# Patient Record
Sex: Male | Born: 1950 | Race: White | Hispanic: No | Smoking: Former smoker
Health system: Southern US, Community
[De-identification: ages and names within clinical notes are randomized; demographics above are authoritative.]

## PROBLEM LIST (undated history)

## (undated) DIAGNOSIS — R51 Headache: Secondary | ICD-10-CM

## (undated) DIAGNOSIS — R519 Headache, unspecified: Secondary | ICD-10-CM

## (undated) DIAGNOSIS — I10 Essential (primary) hypertension: Secondary | ICD-10-CM

## (undated) DIAGNOSIS — I82409 Acute embolism and thrombosis of unspecified deep veins of unspecified lower extremity: Secondary | ICD-10-CM

## (undated) DIAGNOSIS — E7800 Pure hypercholesterolemia, unspecified: Secondary | ICD-10-CM

## (undated) HISTORY — PX: URINARY SURGERY: SHX2626

## (undated) HISTORY — PX: ANTERIOR FUSION CLIVUS-C2 EXTRAORAL W/ ODONTOID EXCISION: SUR618

## (undated) HISTORY — PX: SHOULDER ARTHROSCOPY: SHX128

---

## 2007-10-22 ENCOUNTER — Emergency Department (HOSPITAL_COMMUNITY): Admission: EM | Admit: 2007-10-22 | Discharge: 2007-10-22 | Payer: Self-pay | Admitting: Family Medicine

## 2011-01-09 ENCOUNTER — Emergency Department (HOSPITAL_BASED_OUTPATIENT_CLINIC_OR_DEPARTMENT_OTHER)
Admission: EM | Admit: 2011-01-09 | Discharge: 2011-01-09 | Disposition: A | Discharge status: Home / Self Care | Payer: Worker's Compensation | Attending: Emergency Medicine | Admitting: Emergency Medicine

## 2011-01-09 DIAGNOSIS — X58XXXA Exposure to other specified factors, initial encounter: Secondary | ICD-10-CM | POA: Insufficient documentation

## 2011-01-09 DIAGNOSIS — S61209A Unspecified open wound of unspecified finger without damage to nail, initial encounter: Secondary | ICD-10-CM | POA: Insufficient documentation

## 2014-02-19 ENCOUNTER — Emergency Department (HOSPITAL_COMMUNITY): Payer: BC Managed Care – PPO

## 2014-02-19 ENCOUNTER — Encounter (HOSPITAL_COMMUNITY): Payer: Self-pay | Admitting: Emergency Medicine

## 2014-02-19 ENCOUNTER — Emergency Department (HOSPITAL_COMMUNITY)
Admission: EM | Admit: 2014-02-19 | Discharge: 2014-02-19 | Disposition: A | Discharge status: Home / Self Care | Payer: BC Managed Care – PPO | Attending: Emergency Medicine | Admitting: Emergency Medicine

## 2014-02-19 DIAGNOSIS — S59909A Unspecified injury of unspecified elbow, initial encounter: Secondary | ICD-10-CM | POA: Insufficient documentation

## 2014-02-19 DIAGNOSIS — Y929 Unspecified place or not applicable: Secondary | ICD-10-CM | POA: Insufficient documentation

## 2014-02-19 DIAGNOSIS — R296 Repeated falls: Secondary | ICD-10-CM | POA: Insufficient documentation

## 2014-02-19 DIAGNOSIS — W19XXXA Unspecified fall, initial encounter: Secondary | ICD-10-CM

## 2014-02-19 DIAGNOSIS — M25532 Pain in left wrist: Secondary | ICD-10-CM

## 2014-02-19 DIAGNOSIS — S59919A Unspecified injury of unspecified forearm, initial encounter: Principal | ICD-10-CM

## 2014-02-19 DIAGNOSIS — Z862 Personal history of diseases of the blood and blood-forming organs and certain disorders involving the immune mechanism: Secondary | ICD-10-CM | POA: Insufficient documentation

## 2014-02-19 DIAGNOSIS — Z8639 Personal history of other endocrine, nutritional and metabolic disease: Secondary | ICD-10-CM | POA: Insufficient documentation

## 2014-02-19 DIAGNOSIS — Y9301 Activity, walking, marching and hiking: Secondary | ICD-10-CM | POA: Insufficient documentation

## 2014-02-19 DIAGNOSIS — I10 Essential (primary) hypertension: Secondary | ICD-10-CM | POA: Insufficient documentation

## 2014-02-19 DIAGNOSIS — S6990XA Unspecified injury of unspecified wrist, hand and finger(s), initial encounter: Secondary | ICD-10-CM | POA: Insufficient documentation

## 2014-02-19 DIAGNOSIS — Z87891 Personal history of nicotine dependence: Secondary | ICD-10-CM | POA: Insufficient documentation

## 2014-02-19 HISTORY — DX: Essential (primary) hypertension: I10

## 2014-02-19 HISTORY — DX: Pure hypercholesterolemia, unspecified: E78.00

## 2014-02-19 NOTE — ED Notes (Signed)
Pt. Stated i was walking going back to work and I think my knee gave out and i caught myself on my left hand.  Left hand swollen .

## 2014-02-19 NOTE — Discharge Instructions (Signed)
Rest, ice and elevate your left hand. Wear your splint as recommended. Follow up with Dr. Amedeo Plenty if symptoms do not improve. Refer to attached documents for more information.

## 2014-02-19 NOTE — ED Provider Notes (Signed)
Medical screening examination/treatment/procedure(s) were performed by non-physician practitioner and as supervising physician I was immediately available for consultation/collaboration.   EKG Interpretation None       Threasa Beards, MD 02/19/14 1018

## 2014-02-19 NOTE — ED Provider Notes (Signed)
CSN: [id number]     Arrival date & time 02/19/14  0826 History  This chart was scribed for Alvina Chou, PA, working with Threasa Beards, MD, by Delphia Grates, ED Scribe. This patient was seen in room TR06C/TR06C and the patient's care was started at 9:16 AM.    Chief Complaint  Patient presents with  . Wrist Pain  . Hand Pain  . Fall     Patient is a 63 y.o. male presenting with wrist pain, hand pain, and fall. The history is provided by the patient.  Wrist Pain This is a new problem. The current episode started 12 to 24 hours ago. The problem occurs constantly. The problem has not changed since onset.Pertinent negatives include no chest pain, no abdominal pain, no headaches and no shortness of breath. The symptoms are aggravated by twisting and bending. Nothing relieves the symptoms. He has tried a cold compress for the symptoms. The treatment provided no relief.  Hand Pain This is a new problem. The current episode started 12 to 24 hours ago. The problem has not changed since onset.Pertinent negatives include no chest pain, no abdominal pain, no headaches and no shortness of breath. The symptoms are aggravated by twisting. Nothing relieves the symptoms. He has tried a cold compress for the symptoms.  Fall This is a new problem. The current episode started 12 to 24 hours ago. The problem has not changed since onset.Pertinent negatives include no chest pain, no abdominal pain, no headaches and no shortness of breath. Nothing aggravates the symptoms. Nothing relieves the symptoms.    HPI Comments: Alex [name] is a 63 y.o. male who presents to the Emergency Department complaining of left wrist and left hand pain that began after a fall that occurred yesterday morning. Patient suspects his "knee gave out" while he was walking. He states he caught himself on his left hand. He denies hitting his head or LOC. There is associated left hand pain, swelling and bruising. Patient states he has  applied ice and used a wrist brace with little improvement. He denies any other injuries. Patient is right hand dominant.  Past Medical History  Diagnosis Date  . Hypertension   . Hypercholesterolemia    No past surgical history on file. No family history on file. History  Substance Use Topics  . Smoking status: Former Research scientist (life sciences)  . Smokeless tobacco: Not on file  . Alcohol Use: Yes    Review of Systems  Constitutional: Negative for fever and chills.  Respiratory: Negative for shortness of breath.   Cardiovascular: Negative for chest pain.  Gastrointestinal: Negative for abdominal pain.  Musculoskeletal:       Left hand and wrist pain  Neurological: Negative for headaches.  All other systems reviewed and are negative.     Allergies  Review of patient's allergies indicates not on file.  Home Medications   Prior to Admission medications   Not on File   Triage Vitals: BP 147/89  Pulse 73  Temp(Src) 98.2 F (36.8 C) (Oral)  Resp 18  SpO2 100%  Physical Exam  Nursing note and vitals reviewed. Constitutional: He is oriented to person, place, and time. He appears well-developed and well-nourished. No distress.  HENT:  Head: Normocephalic and atraumatic.  Eyes: Conjunctivae and EOM are normal.  Neck: Neck supple. No tracheal deviation present.  Cardiovascular: Normal rate and intact distal pulses.   Pulmonary/Chest: Effort normal. No respiratory distress.  Musculoskeletal: Normal range of motion.  Slightly limited left wrist ROM due  to pain. Generalized left wrist tenderness to palpation without obvious deformity. Full ROM of fingers of left hand.   Neurological: He is alert and oriented to person, place, and time.  Skin: Skin is warm and dry.  Psychiatric: He has a normal mood and affect. His behavior is normal.    ED Course  Procedures (including critical care time)  DIAGNOSTIC STUDIES: Oxygen Saturation is 100% on room air, normal by my interpretation.     COORDINATION OF CARE: At 0921 Discussed treatment plan with patient which includes wrist brace and applying ice. Patient agrees.  Labs Review Labs Reviewed - No data to display  Imaging Review Dg Wrist Complete Left  02/19/2014   CLINICAL DATA:  Fall.  Left wrist pain.  EXAM: LEFT WRIST - COMPLETE 3+ VIEW  COMPARISON:  None.  FINDINGS: No fracture or dislocation. There are cysts in the lunate and distal scaphoid. Joints are normally spaced and aligned. Soft tissues are unremarkable.  IMPRESSION: No fracture or acute finding.   Electronically Signed   By: Lajean Manes M.D.   On: 02/19/2014 09:09   Dg Hand Complete Left  02/19/2014   CLINICAL DATA:  Fall.  Left hand pain.  EXAM: LEFT HAND - COMPLETE 3+ VIEW  COMPARISON:  None.  FINDINGS: No fracture or dislocation.  Minor degenerative changes are noted involving several interphalangeal joints.  Soft tissues are unremarkable.  IMPRESSION: No fracture or acute finding   Electronically Signed   By: Lajean Manes M.D.   On: 02/19/2014 09:10     EKG Interpretation None      MDM   Final diagnoses:  Fall, initial encounter  Left wrist pain    Patient's xrays unremarkable for acute changes. Patient instructed to wear a splint at home and follow up with hand surgery if symptoms do not improve by next week. No neurovascular compromise.   I personally performed the services described in this documentation, which was scribed in my presence. The recorded information has been reviewed and is accurate.    Alvina Chou, PA-C 02/19/14 1015

## 2015-06-27 ENCOUNTER — Ambulatory Visit (INDEPENDENT_AMBULATORY_CARE_PROVIDER_SITE_OTHER): Payer: BLUE CROSS/BLUE SHIELD | Admitting: Physician Assistant

## 2015-06-27 VITALS — BP 118/70 | HR 90 | Temp 99.4°F | Resp 20 | Ht >= 80 in | Wt 244.8 lb

## 2015-06-27 DIAGNOSIS — J069 Acute upper respiratory infection, unspecified: Secondary | ICD-10-CM

## 2015-06-27 DIAGNOSIS — J029 Acute pharyngitis, unspecified: Secondary | ICD-10-CM | POA: Diagnosis not present

## 2015-06-27 DIAGNOSIS — R0981 Nasal congestion: Secondary | ICD-10-CM

## 2015-06-27 NOTE — Patient Instructions (Signed)
I think you have either a virus or allergies causing your upper respiratory symptoms.  Please continue to take the anti histamine at home. Taking tylenol as needed for the sore throat along with fluids and plenty of rest will help. If you're not feeling better by early next week please give [place] a call and I'm happy to send in an antibiotic. If you start having a worsening cough or persistent fevers or chills please come in sooner.   Upper Respiratory Infection, Adult Most upper respiratory infections (URIs) are a viral infection of the air passages leading to the lungs. A URI affects the nose, throat, and upper air passages. The most common type of URI is nasopharyngitis and is typically referred to as "the common cold." URIs run their course and usually go away on their own. Most of the time, a URI does not require medical attention, but sometimes a bacterial infection in the upper airways can follow a viral infection. This is called a secondary infection. Sinus and middle ear infections are common types of secondary upper respiratory infections. Bacterial pneumonia can also complicate a URI. A URI can worsen asthma and chronic obstructive pulmonary disease (COPD). Sometimes, these complications can require emergency medical care and may be life threatening.  CAUSES Almost all URIs are caused by viruses. A virus is a type of germ and can spread from one person to another.  RISKS FACTORS You may be at risk for a URI if:   You smoke.   You have chronic heart or lung disease.  You have a weakened defense (immune) system.   You are very young or very old.   You have nasal allergies or asthma.  You work in crowded or poorly ventilated areas.  You work in health care facilities or schools. SIGNS AND SYMPTOMS  Symptoms typically develop 2-3 days after you come in contact with a cold virus. Most viral URIs last 7-10 days. However, viral URIs from the influenza virus (flu virus) can last 14-18  days and are typically more severe. Symptoms may include:   Runny or stuffy (congested) nose.   Sneezing.   Cough.   Sore throat.   Headache.   Fatigue.   Fever.   Loss of appetite.   Pain in your forehead, behind your eyes, and over your cheekbones (sinus pain).  Muscle aches.  DIAGNOSIS  Your health care provider may diagnose a URI by:  Physical exam.  Tests to check that your symptoms are not due to another condition such as:  Strep throat.  Sinusitis.  Pneumonia.  Asthma. TREATMENT  A URI goes away on its own with time. It cannot be cured with medicines, but medicines may be prescribed or recommended to relieve symptoms. Medicines may help:  Reduce your fever.  Reduce your cough.  Relieve nasal congestion. HOME CARE INSTRUCTIONS   Take medicines only as directed by your health care provider.   Gargle warm saltwater or take cough drops to comfort your throat as directed by your health care provider.  Use a warm mist humidifier or inhale steam from a shower to increase air moisture. This may make it easier to breathe.  Drink enough fluid to keep your urine clear or pale yellow.   Eat soups and other clear broths and maintain good nutrition.   Rest as needed.   Return to work when your temperature has returned to normal or as your health care provider advises. You may need to stay home longer to avoid infecting others.  You can also use a face mask and careful hand washing to prevent spread of the virus.  Increase the usage of your inhaler if you have asthma.   Do not use any tobacco products, including cigarettes, chewing tobacco, or electronic cigarettes. If you need help quitting, ask your health care provider. PREVENTION  The best way to protect yourself from getting a cold is to practice good hygiene.   Avoid oral or hand contact with people with cold symptoms.   Wash your hands often if contact occurs.  There is no clear  evidence that vitamin C, vitamin E, echinacea, or exercise reduces the chance of developing a cold. However, it is always recommended to get plenty of rest, exercise, and practice good nutrition.  SEEK MEDICAL CARE IF:   You are getting worse rather than better.   Your symptoms are not controlled by medicine.   You have chills.  You have worsening shortness of breath.  You have brown or red mucus.  You have yellow or brown nasal discharge.  You have pain in your face, especially when you bend forward.  You have a fever.  You have swollen neck glands.  You have pain while swallowing.  You have white areas in the back of your throat. SEEK IMMEDIATE MEDICAL CARE IF:   You have severe or persistent:  Headache.  Ear pain.  Sinus pain.  Chest pain.  You have chronic lung disease and any of the following:  Wheezing.  Prolonged cough.  Coughing up blood.  A change in your usual mucus.  You have a stiff neck.  You have changes in your:  Vision.  Hearing.  Thinking.  Mood. MAKE SURE YOU:   Understand these instructions.  Will watch your condition.  Will get help right away if you are not doing well or get worse.   This information is not intended to replace advice given to you by your health care provider. Make sure you discuss any questions you have with your health care provider.   Document Released: 02/09/2001 Document Revised: 12/31/2014 Document Reviewed: 11/21/2013 Elsevier Interactive Patient Education [employer].

## 2015-06-27 NOTE — Progress Notes (Signed)
   Subjective:    Patient ID: Alex [name], male    DOB: 1950-12-20, 64 y.o.   MRN: [id number]  Chief Complaint  Patient presents with  . Sore Throat    3 days ago  . Cough  . Sinusitis   Medications, allergies, past medical history, surgical history, family history, social history and problem list reviewed and updated.  HPI  64 yom presents with above complaints.   Symptoms started 3 days ago with head/nasal congestion. Persistent non productive cough and sore throat past few days. Denies fevers, chills, abd pain, n/v, diarrhea. No cp or sob. Received flu shot 2 wks ago. Has seasonal allergies, took antihistamine yest but no other treatments at home.   Non smoker.   Review of Systems See HPI     Objective:   Physical Exam  Constitutional: He appears well-developed and well-nourished.  Non-toxic appearance. He does not have a sickly appearance. He does not appear ill. No distress.  BP 118/70 mmHg  Pulse 90  Temp(Src) 99.4 F (37.4 C) (Oral)  Resp 20  Ht 6\' 11"  (2.108 m)  Wt 244 lb 12.8 oz (111.041 kg)  BMI 24.99 kg/m2  SpO2 98%   HENT:  Right Ear: Tympanic membrane normal.  Left Ear: Tympanic membrane normal.  Nose: Right sinus exhibits maxillary sinus tenderness. Right sinus exhibits no frontal sinus tenderness. Left sinus exhibits maxillary sinus tenderness. Left sinus exhibits no frontal sinus tenderness.  Mouth/Throat: Uvula is midline, oropharynx is clear and moist and mucous membranes are normal.  Cardiovascular: Normal heart sounds.   Pulmonary/Chest: Effort normal and breath sounds normal. No tachypnea.  Lymphadenopathy:       Head (right side): No submental, no submandibular and no tonsillar adenopathy present.       Head (left side): No submental, no submandibular and no tonsillar adenopathy present.    He has no cervical adenopathy.      Assessment & Plan:   URI (upper respiratory infection)  Sore throat  Head congestion --suspect viral uri vs  allergic rhinitis --doubt bacterial at this time as normal vitals, benign exam, day 3-4 of symptoms --continue antihistamine, mucinex, tylenol prn, fluids, rest --inform us next week of symptoms not improved, consider antibiotics at that time as pt does have mild sinus tenderness today  Julieta Gutting, PA-C Physician Assistant-Certified [facility]  06/27/2015 1:49 PM

## 2015-07-14 ENCOUNTER — Ambulatory Visit
Admission: RE | Admit: 2015-07-14 | Discharge: 2015-07-14 | Disposition: A | Discharge status: Home / Self Care | Payer: BLUE CROSS/BLUE SHIELD | Source: Ambulatory Visit | Attending: Internal Medicine | Admitting: Internal Medicine

## 2015-07-14 ENCOUNTER — Other Ambulatory Visit: Payer: Self-pay | Admitting: Internal Medicine

## 2015-07-14 DIAGNOSIS — M542 Cervicalgia: Secondary | ICD-10-CM

## 2016-04-23 DIAGNOSIS — I82409 Acute embolism and thrombosis of unspecified deep veins of unspecified lower extremity: Secondary | ICD-10-CM

## 2016-04-23 HISTORY — DX: Acute embolism and thrombosis of unspecified deep veins of unspecified lower extremity: I82.409

## 2016-07-25 ENCOUNTER — Emergency Department (HOSPITAL_BASED_OUTPATIENT_CLINIC_OR_DEPARTMENT_OTHER)
Admission: EM | Admit: 2016-07-25 | Discharge: 2016-07-25 | Disposition: A | Discharge status: Home / Self Care | Payer: Worker's Compensation | Attending: Emergency Medicine | Admitting: Emergency Medicine

## 2016-07-25 ENCOUNTER — Encounter (HOSPITAL_BASED_OUTPATIENT_CLINIC_OR_DEPARTMENT_OTHER): Payer: Self-pay | Admitting: *Deleted

## 2016-07-25 ENCOUNTER — Emergency Department (HOSPITAL_BASED_OUTPATIENT_CLINIC_OR_DEPARTMENT_OTHER): Payer: Worker's Compensation

## 2016-07-25 DIAGNOSIS — Y990 Civilian activity done for income or pay: Secondary | ICD-10-CM | POA: Diagnosis not present

## 2016-07-25 DIAGNOSIS — Y9389 Activity, other specified: Secondary | ICD-10-CM | POA: Insufficient documentation

## 2016-07-25 DIAGNOSIS — I10 Essential (primary) hypertension: Secondary | ICD-10-CM | POA: Insufficient documentation

## 2016-07-25 DIAGNOSIS — Z79899 Other long term (current) drug therapy: Secondary | ICD-10-CM | POA: Diagnosis not present

## 2016-07-25 DIAGNOSIS — F17290 Nicotine dependence, other tobacco product, uncomplicated: Secondary | ICD-10-CM | POA: Insufficient documentation

## 2016-07-25 DIAGNOSIS — M25512 Pain in left shoulder: Secondary | ICD-10-CM | POA: Diagnosis not present

## 2016-07-25 DIAGNOSIS — Y9289 Other specified places as the place of occurrence of the external cause: Secondary | ICD-10-CM | POA: Insufficient documentation

## 2016-07-25 DIAGNOSIS — X501XXA Overexertion from prolonged static or awkward postures, initial encounter: Secondary | ICD-10-CM | POA: Insufficient documentation

## 2016-07-25 DIAGNOSIS — S4992XA Unspecified injury of left shoulder and upper arm, initial encounter: Secondary | ICD-10-CM | POA: Diagnosis present

## 2016-07-25 HISTORY — DX: Acute embolism and thrombosis of unspecified deep veins of unspecified lower extremity: I82.409

## 2016-07-25 MED ORDER — OXYCODONE-ACETAMINOPHEN 5-325 MG PO TABS: 1.0000 | ORAL_TABLET | ORAL | 0 refills | Status: DC | PRN

## 2016-07-25 NOTE — ED Triage Notes (Signed)
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  Pain all night, worse today.

## 2016-07-25 NOTE — ED Provider Notes (Signed)
[facility] DEPT [facility] Provider Note   CSN: [id number] Arrival date & time: 07/25/16  1018  History   Chief Complaint Chief Complaint  Patient presents with  . Shoulder Injury    left    HPI Alex [name] is a 65 y.o. male.  HPI  65 y.o. male presents to the Emergency Department today complaining of left shoulder pain. 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 Noted immediate pain. Notes minimal relief with OTC remedies. Unable to sleep at night due to pain. States worse with ROM. No fevers. No numbness/tingling. No N/V. Rates pain 10/10. No other symptoms noted.   Past Medical History:  Diagnosis Date  . DVT (deep venous thrombosis) ([place])   . Hypercholesterolemia   . Hypertension     There are no active problems to display for this patient.   Past Surgical History:  Procedure Laterality Date  . ANTERIOR FUSION CLIVUS-C2 EXTRAORAL W/ ODONTOID EXCISION    . SHOULDER ARTHROSCOPY         Home Medications    Prior to Admission medications   Medication Sig Start Date End Date Taking? Authorizing Provider  LISINOPRIL PO Take by mouth.   Yes Historical Provider, MD  Rivaroxaban (XARELTO PO) Take by mouth.   Yes Historical Provider, MD    Family History No family history on file.  Social History Social History  Substance Use Topics  . Smoking status: Former Research scientist (life sciences)  . Smokeless tobacco: Current User    Types: Snuff  . Alcohol use Yes     Comment: occassional     Allergies   Patient has no known allergies.   Review of Systems Review of Systems  Constitutional: Negative for fever.  Respiratory: Negative for shortness of breath.   Cardiovascular: Negative for chest pain.  Gastrointestinal: Negative for nausea and vomiting.  Musculoskeletal: Positive for arthralgias.   Physical Exam Updated Vital Signs BP 148/80   Pulse 91   Temp 98.2 F (36.8 C)   Resp 20   Ht 6\' 2"  (1.88 m)   Wt  111.1 kg   SpO2 100%   BMI 31.46 kg/m   Physical Exam  Constitutional: He is oriented to person, place, and time. Vital signs are normal. He appears well-developed and well-nourished.  HENT:  Head: Normocephalic.  Right Ear: Hearing normal.  Left Ear: Hearing normal.  Eyes: Conjunctivae and EOM are normal. Pupils are equal, round, and reactive to light.  Neck: Normal range of motion. Neck supple.  Cardiovascular: Normal rate, regular rhythm, normal heart sounds and intact distal pulses.   Pulmonary/Chest: Effort normal and breath sounds normal.  Musculoskeletal:  Left Shoulder Minimal ROM of shoulder due to pain. No obvious deformities. No swelling. No erythema. NVI. Distal pulses appreciated   Neurological: He is alert and oriented to person, place, and time.  Skin: Skin is warm and dry.  Psychiatric: He has a normal mood and affect. His speech is normal and behavior is normal. Thought content normal.  Nursing note and vitals reviewed.  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 FINDINGS: No acute fracture or  dislocation is noted. Mild degenerative changes of the acromioclavicular joint are seen. No other bony abnormality is noted. IMPRESSION: No acute abnormality seen. Electronically Signed   By: Inez Catalina M.D.   On: 07/25/2016 11:10    Procedures Procedures (including critical care time)  Medications Ordered in ED Medications - No data to display   Initial Impression / Assessment and Plan / ED Course  I have reviewed the triage vital signs and the nursing notes.  Pertinent labs & imaging results that were available during my care of the patient were reviewed  by me and considered in my medical decision making (see chart for details).  Clinical Course    Final Clinical Impressions(s) / ED Diagnoses   {I have reviewed and evaluated the relevant imaging studies.  {I have reviewed the relevant previous healthcare records.  {I obtained HPI from historian.   ED Course:  Assessment: Pt is a 65yM who presents with left shoulder pain s/p mechanical injury reaching for object overhead. Felt pop. On exam, pt in NAD. Nontoxic/nonseptic appearing. VSS. Afebrile. Lungs CTA. Heart RRR. Left shoulder with pain on ROM. No obvious deformitiesexam.  Imaging negative for acute fracture/dislocation. Likely rotator cuff sprain vs tear. Given sling in ED. Rx Percocet #10. I have reviewed the [place] Controlled Substance Reporting System. UDS obtained due to worker's comp. Plan is to DC home with follow up to Ortho. At time of discharge, Patient is in no acute distress. Vital Signs are stable. Patient is able to ambulate. Patient able to tolerate PO.   Disposition/Plan:  DC Home Additional Verbal discharge instructions given and discussed with patient.  Pt Instructed to f/u with Ortho in the next week for evaluation and treatment of symptoms. Return precautions given Pt acknowledges and agrees with plan  Supervising Physician Alfonzo Beers, MD  Final diagnoses:  Acute pain of left shoulder    New Prescriptions New Prescriptions   No medications on file     Shary Decamp, PA-C 07/25/16 [place], MD 07/25/16 [phone]

## 2016-07-25 NOTE — Discharge Instructions (Signed)
Please read and follow all provided instructions.  Your diagnoses today include:  1. Acute pain of left shoulder     Tests performed today include: Vital signs. See below for your results today.   Medications prescribed:  Take as prescribed   Home care instructions:  Follow any educational materials contained in this packet.  Follow-up instructions: Please follow-up with your Orthopedic provider for further evaluation of symptoms and treatment   Return instructions:  Please return to the Emergency Department if you do not get better, if you get worse, or new symptoms OR  - Fever (temperature greater than 101.92F)  - Bleeding that does not stop with holding pressure to the area    -Severe pain (please note that you may be more sore the day after your accident)  - Chest Pain  - Difficulty breathing  - Severe nausea or vomiting  - Inability to tolerate food and liquids  - Passing out  - Skin becoming red around your wounds  - Change in mental status (confusion or lethargy)  - New numbness or weakness    Please return if you have any other emergent concerns.  Additional Information:  Your vital signs today were: BP 148/80    Pulse 91    Temp 98.2 F (36.8 C)    Resp 20    Ht 6\' 2"  (1.88 m)    Wt 111.1 kg    SpO2 100%    BMI 31.46 kg/m  If your blood pressure (BP) was elevated above 135/85 this visit, please have this repeated by your doctor within one month. ---------------

## 2016-07-26 ENCOUNTER — Telehealth (HOSPITAL_BASED_OUTPATIENT_CLINIC_OR_DEPARTMENT_OTHER): Payer: Self-pay | Admitting: *Deleted

## 2016-07-26 NOTE — Telephone Encounter (Signed)
Spoke with patient, Alex [name], regarding note to return to work. States needed clarification for use of left arm. Chart reviewed by Dr. Venora Maples. Work note modified to include "may use left arm for minor tasks as tolerated" per VORB from Dr. Venora Maples. Revised note faxed to [phone] ATTN: Merry Proud per pt request

## 2016-08-09 ENCOUNTER — Emergency Department (HOSPITAL_BASED_OUTPATIENT_CLINIC_OR_DEPARTMENT_OTHER): Payer: BLUE CROSS/BLUE SHIELD

## 2016-08-09 ENCOUNTER — Emergency Department (HOSPITAL_BASED_OUTPATIENT_CLINIC_OR_DEPARTMENT_OTHER)
Admission: EM | Admit: 2016-08-09 | Discharge: 2016-08-09 | Disposition: A | Discharge status: Home / Self Care | Payer: BLUE CROSS/BLUE SHIELD | Attending: Emergency Medicine | Admitting: Emergency Medicine

## 2016-08-09 ENCOUNTER — Encounter (HOSPITAL_BASED_OUTPATIENT_CLINIC_OR_DEPARTMENT_OTHER): Payer: Self-pay

## 2016-08-09 DIAGNOSIS — X503XXA Overexertion from repetitive movements, initial encounter: Secondary | ICD-10-CM | POA: Diagnosis not present

## 2016-08-09 DIAGNOSIS — Y990 Civilian activity done for income or pay: Secondary | ICD-10-CM | POA: Diagnosis not present

## 2016-08-09 DIAGNOSIS — Z79899 Other long term (current) drug therapy: Secondary | ICD-10-CM | POA: Insufficient documentation

## 2016-08-09 DIAGNOSIS — Y9389 Activity, other specified: Secondary | ICD-10-CM | POA: Diagnosis not present

## 2016-08-09 DIAGNOSIS — G5603 Carpal tunnel syndrome, bilateral upper limbs: Secondary | ICD-10-CM | POA: Insufficient documentation

## 2016-08-09 DIAGNOSIS — F17290 Nicotine dependence, other tobacco product, uncomplicated: Secondary | ICD-10-CM | POA: Diagnosis not present

## 2016-08-09 DIAGNOSIS — I10 Essential (primary) hypertension: Secondary | ICD-10-CM | POA: Diagnosis not present

## 2016-08-09 DIAGNOSIS — Y92511 Restaurant or cafe as the place of occurrence of the external cause: Secondary | ICD-10-CM | POA: Insufficient documentation

## 2016-08-09 DIAGNOSIS — S6991XA Unspecified injury of right wrist, hand and finger(s), initial encounter: Secondary | ICD-10-CM | POA: Diagnosis present

## 2016-08-09 MED ORDER — OXYCODONE-ACETAMINOPHEN 5-325 MG PO TABS: 1.0000 | ORAL_TABLET | Freq: Four times a day (QID) | ORAL | 0 refills | Status: DC | PRN

## 2016-08-09 NOTE — ED Notes (Addendum)
Bilateral hand swelling since yesterday, cannot bend them.  Pain is not relieved with OTC meds.  Pt works with his hands and does a lot of repetitive movement.

## 2016-08-09 NOTE — ED Triage Notes (Signed)
Pain swelling to right hand started yesterday-denies injury-NAD-steady gait

## 2016-08-09 NOTE — ED Provider Notes (Signed)
[facility] Provider Note   CSN: [id number] Arrival date & time: 08/09/16  2009 By signing my name below, I, Doran Stabler, attest that this documentation has been prepared under the direction and in the presence of Montine Circle. Electronically Signed: Doran Stabler, ED Scribe. 08/09/16. 10:28 PM.   History   Chief Complaint Chief Complaint  Patient presents with  . Hand Pain   The history is provided by the patient. No language interpreter was used.   HPI Comments: Alex [name] is a 65 y.o. 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 Pt states he is unable to bend his fingers and wrist without having pain. Pt tried using heat, ice, ibuprofen, Tylenol and oxycodone with no relief. Pt denies any fevers, chills, CP, SOB, N/V/D, or any other symptoms at this time. Pt works at [employer].  Past Medical History:  Diagnosis Date  . DVT (deep venous thrombosis) ([place])   . Hypercholesterolemia   . Hypertension    There are no active problems to display for this patient.  Past Surgical History:  Procedure Laterality Date  . ANTERIOR FUSION CLIVUS-C2 EXTRAORAL W/ ODONTOID EXCISION    . SHOULDER ARTHROSCOPY     Home Medications    Prior to Admission medications   Medication Sig Start Date End Date Taking? Authorizing Provider  LISINOPRIL PO Take by mouth.    Historical Provider, MD  oxyCODONE-acetaminophen (PERCOCET/ROXICET) 5-325 MG tablet Take 1 tablet by mouth every 4 (four) hours as needed for severe pain. 07/25/16   Shary Decamp, PA-C  Rivaroxaban (XARELTO PO) Take by mouth.    Historical Provider, MD   Family History No family history on file.  Social History Social History  Substance Use Topics  . Smoking status: Former Research scientist (life sciences)  . Smokeless tobacco: Current User    Types: Snuff  . Alcohol use Yes     Comment: occassional   Allergies   Patient  has no known allergies.  Review of Systems Review of Systems  Constitutional: Negative for chills and fever.  Respiratory: Negative for shortness of breath.   Cardiovascular: Negative for chest pain.  Gastrointestinal: Negative for diarrhea, nausea and vomiting.  Musculoskeletal: Positive for arthralgias.   Physical Exam Updated Vital Signs BP (!) 161/101 (BP Location: Left Arm)   Pulse 91   Temp 98.3 F (36.8 C) (Oral)   Resp 20   Ht 6\' 2"  (1.88 m)   Wt 243 lb (110.2 kg)   SpO2 100%   BMI 31.20 kg/m   Physical Exam Physical Exam  Constitutional: Pt appears well-developed and well-nourished. No distress.  HENT:  Head: Normocephalic and atraumatic.  Eyes: Conjunctivae are normal.  Neck: Normal range of motion.  Cardiovascular: Normal rate, regular rhythm and intact distal pulses.   Capillary refill < 3 sec  Pulmonary/Chest: Effort normal and breath sounds normal.  Musculoskeletal:  Bilateral Wrists: Pt exhibits tenderness no focal tenderness, but does have positive tinel and phalen tests. Pt exhibits no edema. No bony abnormality or deformity. ROM: passive 5/5 bilateral and 5/5 actively when distracted  Neurological: Pt  is alert. Coordination normal.  Sensation 5/5 bilaterally Strength limited 2/2 pain  Skin: Skin is warm and dry. Pt is not diaphoretic.  No tenting of the skin  Psychiatric: Pt has a normal mood and affect.  Nursing note and vitals reviewed.  ED Treatments / Results  DIAGNOSTIC STUDIES:  Oxygen Saturation is 100% on room air, normal by my interpretation.    COORDINATION OF CARE: 10:32 PM Discussed treatment plan with pt at bedside and pt agreed to plan.  Labs (all labs ordered are listed, but only abnormal results are displayed) Labs Reviewed - No data to display  EKG  EKG Interpretation None      Radiology Dg Hand Complete Right  Result Date: 08/09/2016 CLINICAL DATA:  RIGHT hand pain and swelling beginning yesterday. No injury. EXAM:  RIGHT HAND - COMPLETE 3+ VIEW COMPARISON:  None. FINDINGS: There is no evidence of fracture or dislocation. There is no evidence of arthropathy or other focal bone abnormality. Soft tissues are unremarkable. IMPRESSION: Negative. Electronically Signed   By: Elon Alas M.D.   On: 08/09/2016 20:41   Procedures Procedures (including critical care time)  Medications Ordered in ED Medications - No data to display  Initial Impression / Assessment and Plan / ED Course  I have reviewed the triage vital signs and the nursing notes.  Pertinent labs & imaging results that were available during my care of the patient were reviewed by me and considered in my medical decision making (see chart for details).  Clinical Course    Patient with bilateral wrist and hand stiffness. He has no evidence of traumatic injury. He does have decreased wrist flexion and strength secondary to pain. He has a positive Tinel and Phalen tests. 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 I will give the patient Splints, and recommend hand follow-up. Patient understands and agrees with the plan. He is neurovascularly intact. He is stable and ready for discharge.  Final Clinical Impressions(s) / ED Diagnoses   Final diagnoses:  Bilateral carpal tunnel syndrome   New Prescriptions Current Discharge Medication List     I personally performed the services described in this documentation, which was scribed in my presence. The recorded information has been reviewed and is accurate.      Montine Circle, PA-C 08/09/16 2252    Orlie Dakin, MD 08/10/16 [phone]

## 2016-08-17 ENCOUNTER — Other Ambulatory Visit: Payer: Self-pay | Admitting: Internal Medicine

## 2016-08-17 DIAGNOSIS — R945 Abnormal results of liver function studies: Principal | ICD-10-CM

## 2016-08-17 DIAGNOSIS — R413 Other amnesia: Secondary | ICD-10-CM

## 2016-08-17 DIAGNOSIS — R7989 Other specified abnormal findings of blood chemistry: Secondary | ICD-10-CM

## 2016-08-26 ENCOUNTER — Ambulatory Visit
Admission: RE | Admit: 2016-08-26 | Discharge: 2016-08-26 | Disposition: A | Discharge status: Home / Self Care | Payer: BLUE CROSS/BLUE SHIELD | Source: Ambulatory Visit | Attending: Internal Medicine | Admitting: Internal Medicine

## 2016-08-26 DIAGNOSIS — R945 Abnormal results of liver function studies: Principal | ICD-10-CM

## 2016-08-26 DIAGNOSIS — R7989 Other specified abnormal findings of blood chemistry: Secondary | ICD-10-CM

## 2016-08-26 DIAGNOSIS — R413 Other amnesia: Secondary | ICD-10-CM

## 2017-05-09 DIAGNOSIS — R600 Localized edema: Secondary | ICD-10-CM | POA: Diagnosis not present

## 2017-05-09 DIAGNOSIS — L03116 Cellulitis of left lower limb: Secondary | ICD-10-CM | POA: Diagnosis not present

## 2017-05-09 DIAGNOSIS — Z6830 Body mass index (BMI) 30.0-30.9, adult: Secondary | ICD-10-CM | POA: Diagnosis not present

## 2017-05-13 ENCOUNTER — Encounter (HOSPITAL_COMMUNITY): Payer: Self-pay | Admitting: Emergency Medicine

## 2017-05-13 ENCOUNTER — Emergency Department (HOSPITAL_BASED_OUTPATIENT_CLINIC_OR_DEPARTMENT_OTHER)
Admit: 2017-05-13 | Discharge: 2017-05-13 | Disposition: A | Discharge status: Home / Self Care | Payer: BLUE CROSS/BLUE SHIELD | Attending: Emergency Medicine | Admitting: Emergency Medicine

## 2017-05-13 ENCOUNTER — Inpatient Hospital Stay (HOSPITAL_COMMUNITY)
Admission: EM | Admit: 2017-05-13 | Discharge: 2017-05-15 | DRG: 603 | Disposition: A | Discharge status: Home / Self Care | Payer: BLUE CROSS/BLUE SHIELD | Attending: Nephrology | Admitting: Nephrology

## 2017-05-13 DIAGNOSIS — Z79899 Other long term (current) drug therapy: Secondary | ICD-10-CM

## 2017-05-13 DIAGNOSIS — E7800 Pure hypercholesterolemia, unspecified: Secondary | ICD-10-CM | POA: Diagnosis not present

## 2017-05-13 DIAGNOSIS — M79662 Pain in left lower leg: Secondary | ICD-10-CM | POA: Diagnosis not present

## 2017-05-13 DIAGNOSIS — F17220 Nicotine dependence, chewing tobacco, uncomplicated: Secondary | ICD-10-CM | POA: Diagnosis present

## 2017-05-13 DIAGNOSIS — Z791 Long term (current) use of non-steroidal anti-inflammatories (NSAID): Secondary | ICD-10-CM | POA: Diagnosis not present

## 2017-05-13 DIAGNOSIS — L03116 Cellulitis of left lower limb: Secondary | ICD-10-CM | POA: Diagnosis not present

## 2017-05-13 DIAGNOSIS — Z7901 Long term (current) use of anticoagulants: Secondary | ICD-10-CM | POA: Diagnosis not present

## 2017-05-13 DIAGNOSIS — R600 Localized edema: Secondary | ICD-10-CM | POA: Diagnosis not present

## 2017-05-13 DIAGNOSIS — Z881 Allergy status to other antibiotic agents status: Secondary | ICD-10-CM | POA: Diagnosis not present

## 2017-05-13 DIAGNOSIS — Z86718 Personal history of other venous thrombosis and embolism: Secondary | ICD-10-CM

## 2017-05-13 DIAGNOSIS — M7989 Other specified soft tissue disorders: Secondary | ICD-10-CM

## 2017-05-13 DIAGNOSIS — I10 Essential (primary) hypertension: Secondary | ICD-10-CM | POA: Diagnosis present

## 2017-05-13 DIAGNOSIS — Z6831 Body mass index (BMI) 31.0-31.9, adult: Secondary | ICD-10-CM | POA: Diagnosis not present

## 2017-05-13 LAB — CBC WITH DIFFERENTIAL/PLATELET
Basophils Absolute: 0 10*3/uL (ref 0.0–0.1)
Basophils Relative: 1 %
EOS ABS: 0.4 10*3/uL (ref 0.0–0.7)
Eosinophils Relative: 5 %
HEMATOCRIT: 44.3 % (ref 39.0–52.0)
HEMOGLOBIN: 14.6 g/dL (ref 13.0–17.0)
LYMPHS ABS: 2.6 10*3/uL (ref 0.7–4.0)
LYMPHS PCT: 36 %
MCH: 30.6 pg (ref 26.0–34.0)
MCHC: 33 g/dL (ref 30.0–36.0)
MCV: 92.9 fL (ref 78.0–100.0)
MONOS PCT: 12 %
Monocytes Absolute: 0.9 10*3/uL (ref 0.1–1.0)
NEUTROS ABS: 3.2 10*3/uL (ref 1.7–7.7)
NEUTROS PCT: 46 %
Platelets: 164 10*3/uL (ref 150–400)
RBC: 4.77 MIL/uL (ref 4.22–5.81)
RDW: 13.9 % (ref 11.5–15.5)
WBC: 7.1 10*3/uL (ref 4.0–10.5)

## 2017-05-13 MED ORDER — CEFAZOLIN SODIUM-DEXTROSE 1-4 GM/50ML-% IV SOLN
1.0000 g | Freq: Once | INTRAVENOUS | Status: AC
  Administered 2017-05-14: 1 g via INTRAVENOUS
  Filled 2017-05-13: qty 50

## 2017-05-13 MED ORDER — SULFAMETHOXAZOLE-TRIMETHOPRIM 400-80 MG/5ML IV SOLN: 160.0000 mg | Freq: Once | INTRAVENOUS | Status: DC

## 2017-05-13 MED ORDER — VANCOMYCIN HCL 10 G IV SOLR
2000.0000 mg | Freq: Once | INTRAVENOUS | Status: AC
  Administered 2017-05-13: 2000 mg via INTRAVENOUS
  Filled 2017-05-13: qty 2000

## 2017-05-13 MED ORDER — DIPHENHYDRAMINE HCL 50 MG/ML IJ SOLN
25.0000 mg | Freq: Once | INTRAMUSCULAR | Status: AC
  Administered 2017-05-14: 25 mg via INTRAVENOUS
  Filled 2017-05-13: qty 1

## 2017-05-13 NOTE — ED Provider Notes (Signed)
Complains of mild left lower extremity swelling and pain and redness for one week.Marland Kitchen He's been treated with doxycycline for 7 days, symptoms worser denies nausea oing. No othed symptoms.   Orlie Dakin, MD 05/13/17 2228

## 2017-05-13 NOTE — ED Notes (Addendum)
Pt reports pain and redness to left knee since last weekend, saw pcp was placed on abx, noticed leg began swelling x2 days ago, pcp sent pt to orthopedic who drew fluids off his knee and did xrays, now concerned for dvt. Pedal pulse present. Pt is on eloquis, hx of dvt. Dr tegeler made aware and gave verbal order for dvt study

## 2017-05-13 NOTE — ED Notes (Signed)
Pt called out due to reaction to Vancomycin; pt has redness going up right arm of which Lucianne Lei is running; pt has no SOB just reports itching; PA notified and Vancomycin stopped;

## 2017-05-13 NOTE — ED Notes (Signed)
Lt leg red and swollen since Monday.  He has been seen by doctors and had a [place] of his leg today  No dvt.  No pain  Just swelling

## 2017-05-13 NOTE — ED Provider Notes (Signed)
[facility] DEPT Provider Note   CSN: [id number] Arrival date & time: 05/13/17  1507     History   Chief Complaint Chief Complaint  Patient presents with  . Leg Swelling    HPI Alex [name] is a 66 y.o. male.  Alex WEIGELT is a 66 y.o. Male who presents to the ED with left knee redness and left leg swelling for the past week. Patient reports he was seen last week by his PCP for redness and swelling to his left knee. His PCP obtain blood work and a uric acid level. His uric acid level was normal and had an elevated white count. They therefore started him on doxycycline and naproxen. At follow-up today he is still having left knee redness and has now developed swelling to his left lower extremity. He has a history of DVT and is on Xarelto. He was sent to see orthopedic surgeon Dr. Dot Lanes who did a knee joint aspiration. He reports the fluid was clear and he was not concerned for a septic joint. He was sent to the ED to rule out DVT and cellulitis. Patient reports he has only discomfort to his leg due to the swelling. He denies any pain to his left knee or leg. He reports some brief chills the other day, but none currently. He denies any fevers. He denies trouble moving his left knee. He has been complaint with Xarelto and has been taking Doxycycline for 7 days now. His PCP is [facility]. He denies fevers, difficulty moving his knee, knee injury, abdominal pain, nausea, vomiting, diarrhea, chest pain, shortness of breath, numbness, tingling, weakness or other rashes.   The history is provided by the patient, medical records and a relative. No language interpreter was used.    Past Medical History:  Diagnosis Date  . DVT (deep venous thrombosis) ([place])   . Hypercholesterolemia   . Hypertension     Patient Active Problem List   Diagnosis Date Noted  . Personal history of DVT (deep vein thrombosis) 05/14/2017  . Hypertension 05/14/2017  . Cellulitis of left  leg 05/14/2017    Past Surgical History:  Procedure Laterality Date  . ANTERIOR FUSION CLIVUS-C2 EXTRAORAL W/ ODONTOID EXCISION    . SHOULDER ARTHROSCOPY         Home Medications    Prior to Admission medications   Medication Sig Start Date End Date Taking? Authorizing Provider  doxycycline (VIBRA-TABS) 100 MG tablet Take 100 mg by mouth daily. 05/09/17  Yes [provider]  lisinopril (PRINIVIL,ZESTRIL) 5 MG tablet Take 5 mg by mouth daily.    Yes [provider]  naproxen (NAPROSYN) 500 MG tablet Take 500 mg by mouth daily. 05/09/17  Yes [provider]  rivaroxaban (XARELTO) 20 MG TABS tablet Take 20 mg by mouth daily.    Yes [provider]    Family History History reviewed. No pertinent family history.  Social History Social History  Substance Use Topics  . Smoking status: Former Research scientist (life sciences)  . Smokeless tobacco: Current User    Types: Snuff  . Alcohol use Yes     Comment: occassional     Allergies   Vancomycin   Review of Systems Review of Systems  Constitutional: Negative for chills and fever.  HENT: Negative for sore throat.   Eyes: Negative for visual disturbance.  Respiratory: Negative for cough and shortness of breath.   Cardiovascular: Positive for leg swelling. Negative for chest pain.  Gastrointestinal: Negative for abdominal pain,  nausea and vomiting.  Genitourinary: Negative for dysuria.  Musculoskeletal: Positive for joint swelling. Negative for back pain and neck pain.  Skin: Positive for color change and rash.  Neurological: Negative for tremors, light-headedness, numbness and headaches.     Physical Exam Updated Vital Signs BP 132/63 (BP Location: Right Arm)   Pulse 67   Temp 97.8 F (36.6 C) (Oral)   Resp 19   Ht 6\' 4"  (1.93 m)   Wt 108.9 kg (240 lb)   SpO2 95%   BMI 29.21 kg/m   Physical Exam  Constitutional: He appears well-developed and well-nourished. No distress.  Nontoxic-appearing.  HENT:    Head: Normocephalic and atraumatic.  Mouth/Throat: Oropharynx is clear and moist.  Eyes: Pupils are equal, round, and reactive to light. Conjunctivae are normal. Right eye exhibits no discharge. Left eye exhibits no discharge.  Neck: Neck supple.  Cardiovascular: Normal rate, regular rhythm, normal heart sounds and intact distal pulses.  Exam reveals no gallop and no friction rub.   No murmur heard. Bilateral radial and dorsalis pedis pulses are intact. Good capillary refill to his toes bilaterally.  Pulmonary/Chest: Effort normal and breath sounds normal. No respiratory distress. He has no wheezes. He has no rales.  Abdominal: Soft. There is no tenderness.  Musculoskeletal: Normal range of motion. He exhibits edema. He exhibits no deformity.  Patient has erythema overlying the anterior aspect of his left knee. No streaking erythema. He has edema to his left knee and down his left leg into his left ankle. No calf tenderness to palpation. He has good range of motion of his knee without difficulty.  Lymphadenopathy:    He has no cervical adenopathy.  Neurological: He is alert. No sensory deficit. Coordination normal.  Skin: Skin is warm and dry. Capillary refill takes less than 2 seconds. Rash noted. He is not diaphoretic. No erythema. No pallor.  See musculoskeletal  Psychiatric: He has a normal mood and affect. His behavior is normal.  Nursing note and vitals reviewed.    ED Treatments / Results  Labs (all labs ordered are listed, but only abnormal results are displayed) Labs Reviewed  BASIC METABOLIC PANEL - Abnormal; Notable for the following:       Result Value   Calcium 8.4 (*)    All other components within normal limits  CULTURE, BLOOD (ROUTINE X 2)  CULTURE, BLOOD (ROUTINE X 2)  CBC WITH DIFFERENTIAL/PLATELET  HIV ANTIBODY (ROUTINE TESTING)  BASIC METABOLIC PANEL  CBC  SEDIMENTATION RATE  C-REACTIVE PROTEIN    EKG  EKG Interpretation None       Radiology No  results found.  Procedures Procedures (including critical care time)  Medications Ordered in ED Medications  rivaroxaban (XARELTO) tablet 20 mg (not administered)  lisinopril (PRINIVIL,ZESTRIL) tablet 5 mg (not administered)  ceFAZolin (ANCEF) IVPB 2g/100 mL premix (not administered)  sodium chloride flush (NS) 0.9 % injection 3 mL (not administered)  sodium chloride flush (NS) 0.9 % injection 3 mL (not administered)  0.9 %  sodium chloride infusion (not administered)  acetaminophen (TYLENOL) tablet 650 mg (not administered)    Or  acetaminophen (TYLENOL) suppository 650 mg (not administered)  HYDROcodone-acetaminophen (NORCO/VICODIN) 5-325 MG per tablet 1-2 tablet (not administered)  senna-docusate (Senokot-S) tablet 1 tablet (not administered)  ondansetron (ZOFRAN) tablet 4 mg (not administered)    Or  ondansetron (ZOFRAN) injection 4 mg (not administered)  vancomycin (VANCOCIN) 2,000 mg in sodium chloride 0.9 % 500 mL IVPB (0 mg Intravenous Stopped 05/13/17 2336)  ceFAZolin (ANCEF) IVPB 1 g/50 mL premix (0 g Intravenous Stopped 05/14/17 0106)  diphenhydrAMINE (BENADRYL) injection 25 mg (25 mg Intravenous Given 05/14/17 0023)     Initial Impression / Assessment and Plan / ED Course  I have reviewed the triage vital signs and the nursing notes.  Pertinent labs & imaging results that were available during my care of the patient were reviewed by me and considered in my medical decision making (see chart for details).    This  is a 66 y.o. Male who presents to the ED with left knee redness and left leg swelling for the past week. Patient reports he was seen last week by his PCP for redness and swelling to his left knee. His PCP obtain blood work and a uric acid level. His uric acid level was normal and had an elevated white count. They therefore started him on doxycycline and naproxen. At follow-up today he is still having left knee redness and has now developed swelling to his left lower  extremity. He has a history of DVT and is on Xarelto. He was sent to see orthopedic surgeon Dr. Dot Lanes who did a knee joint aspiration. He reports the fluid was clear and he was not concerned for a septic joint. He was sent to the ED to rule out DVT and cellulitis. Patient reports he has only discomfort to his leg due to the swelling. He denies any pain to his left knee or leg. He reports some brief chills the other day, but none currently. He denies any fevers. He denies trouble moving his left knee. He has been complaint with Xarelto and has been taking Doxycycline for 7 days now. On exam patient is afebrile nontoxic appearing. He has erythema overlying his left knee. He also has edema noted from his left knee down to his left ankle. No calf tenderness. He has good range of motion of his left knee without difficulty. Low suspicion for septic joint. Joint aspiration done by orthopedic surgeon earlier today was clear. They are not concerned for septic joint. DVT study is negative.  Patient has had failure of outpatient therapy for cellulitis. Will start him on vancomycin, obtain blood work and plan for admission to the hospital. Patient family agree with plan.  Patient started on vancomycin and then quickly started having itching all over his body. This was discontinued. He is provided with some Benadryl. He has no trouble breathing, tongue or lip swelling. Will start on Ancef 1 g for cellulitis.  Blood work here is unremarkable. Blood cultures pending.  I consulted with hospitalist Dr. Myna Hidalgo who accepted the patient for admission.   This patient was discussed with and evaluated by Dr. Cathleen Fears who agrees with assessment and plan.  Final Clinical Impressions(s) / ED Diagnoses   Final diagnoses:  Cellulitis of leg, left    New Prescriptions Current Discharge Medication List       Waynetta Pean, Hershal Coria 05/14/17 [place], [place], MD 05/14/17 1447

## 2017-05-13 NOTE — Progress Notes (Signed)
VASCULAR LAB PRELIMINARY  PRELIMINARY  PRELIMINARY  PRELIMINARY  Left lower extremity venous duplex completed.    Preliminary report:  Technically difficult due to swelling of the calf. No obvious evidence of DVT, superficial thrombosis, or Baker's cyst of the left lower extremity.  Alex [name], RVS 05/13/2017, 7:13 PM

## 2017-05-13 NOTE — Progress Notes (Signed)
Pharmacy Antibiotic Note  Alex [name] is a 66 y.o. male admitted on 05/13/2017 with pain and swelling in L knee. PCP reportedly drew fluid and sent for culture. Dopplers negative for DVT. All labs pending at this time.   Plan: -Vancomycin 2000 mg IV x1 -Maintenance doses based off of SCr, pending -F/u renal fx, cultures, VT as needed  Height: 6\' 4"  (193 cm) Weight: 240 lb (108.9 kg) IBW/kg (Calculated) : 86.8  Temp (24hrs), Avg:97.4 F (36.3 C), Min:97.4 F (36.3 C), Max:97.4 F (36.3 C)  No results for input(s): WBC, CREATININE, LATICACIDVEN, VANCOTROUGH, VANCOPEAK, VANCORANDOM, GENTTROUGH, GENTPEAK, GENTRANDOM, TOBRATROUGH, TOBRAPEAK, TOBRARND, AMIKACINPEAK, AMIKACINTROU, AMIKACIN in the last 168 hours.  CrCl cannot be calculated (No order found.).     Antimicrobials this admission: 9/14 vancomycin >  Dose adjustments this admission: N/A  Microbiology results: N/A    Harvel Quale 05/13/2017 10:04 PM

## 2017-05-13 NOTE — ED Notes (Signed)
Unable to get enough blood  From iv start to get the blood needed.  plebotomist  Will finish draw

## 2017-05-13 NOTE — ED Notes (Signed)
No answer x 2 for triage.

## 2017-05-14 ENCOUNTER — Encounter (HOSPITAL_COMMUNITY): Payer: Self-pay | Admitting: Family Medicine

## 2017-05-14 DIAGNOSIS — Z86718 Personal history of other venous thrombosis and embolism: Secondary | ICD-10-CM | POA: Diagnosis not present

## 2017-05-14 DIAGNOSIS — I10 Essential (primary) hypertension: Secondary | ICD-10-CM | POA: Diagnosis present

## 2017-05-14 DIAGNOSIS — L03116 Cellulitis of left lower limb: Secondary | ICD-10-CM | POA: Diagnosis not present

## 2017-05-14 LAB — CBC
HCT: 40.6 % (ref 39.0–52.0)
Hemoglobin: 13.4 g/dL (ref 13.0–17.0)
MCH: 30.6 pg (ref 26.0–34.0)
MCHC: 33 g/dL (ref 30.0–36.0)
MCV: 92.7 fL (ref 78.0–100.0)
Platelets: 211 10*3/uL (ref 150–400)
RBC: 4.38 MIL/uL (ref 4.22–5.81)
RDW: 14 % (ref 11.5–15.5)
WBC: 6.1 10*3/uL (ref 4.0–10.5)

## 2017-05-14 LAB — BASIC METABOLIC PANEL
ANION GAP: 4 — AB (ref 5–15)
Anion gap: 5 (ref 5–15)
Anion gap: 7 (ref 5–15)
BUN: 19 mg/dL (ref 6–20)
BUN: 19 mg/dL (ref 6–20)
BUN: 20 mg/dL (ref 6–20)
CALCIUM: 8.1 mg/dL — AB (ref 8.9–10.3)
CHLORIDE: 109 mmol/L (ref 101–111)
CHLORIDE: 107 mmol/L (ref 101–111)
CHLORIDE: 108 mmol/L (ref 101–111)
CO2: 23 mmol/L (ref 22–32)
CO2: 25 mmol/L (ref 22–32)
CO2: 26 mmol/L (ref 22–32)
CREATININE: 0.72 mg/dL (ref 0.61–1.24)
CREATININE: 0.8 mg/dL (ref 0.61–1.24)
Calcium: 8.2 mg/dL — ABNORMAL LOW (ref 8.9–10.3)
Calcium: 8.4 mg/dL — ABNORMAL LOW (ref 8.9–10.3)
Creatinine, Ser: 0.74 mg/dL (ref 0.61–1.24)
GFR calc Af Amer: >60 mL/min (ref >60)
GFR calc non Af Amer: >60 mL/min (ref >60)
Glucose, Bld: 118 mg/dL — ABNORMAL HIGH (ref 65–99)
Glucose, Bld: 95 mg/dL (ref 65–99)
Glucose, Bld: 97 mg/dL (ref 65–99)
POTASSIUM: 3.7 mmol/L (ref 3.5–5.1)
POTASSIUM: 3.5 mmol/L (ref 3.5–5.1)
POTASSIUM: 4 mmol/L (ref 3.5–5.1)
SODIUM: 138 mmol/L (ref 135–145)
SODIUM: 138 mmol/L (ref 135–145)
SODIUM: 138 mmol/L (ref 135–145)

## 2017-05-14 LAB — MRSA PCR SCREENING: MRSA BY PCR: NEGATIVE

## 2017-05-14 LAB — SEDIMENTATION RATE: Sed Rate: 10 mm/hr (ref 0–16)

## 2017-05-14 LAB — HIV ANTIBODY (ROUTINE TESTING W REFLEX): HIV SCREEN 4TH GENERATION: NONREACTIVE

## 2017-05-14 LAB — GLUCOSE, CAPILLARY
GLUCOSE-CAPILLARY: 92 mg/dL (ref 65–99)
GLUCOSE-CAPILLARY: 85 mg/dL (ref 65–99)

## 2017-05-14 LAB — C-REACTIVE PROTEIN: CRP: 4 mg/dL — ABNORMAL HIGH (ref <1.0)

## 2017-05-14 MED ORDER — HYDROCODONE-ACETAMINOPHEN 5-325 MG PO TABS: 1.0000 | ORAL_TABLET | ORAL | Status: DC | PRN

## 2017-05-14 MED ORDER — SODIUM CHLORIDE 0.9% FLUSH
3.0000 mL | Freq: Two times a day (BID) | INTRAVENOUS | Status: DC
  Administered 2017-05-14: 3 mL via INTRAVENOUS

## 2017-05-14 MED ORDER — ACETAMINOPHEN 325 MG PO TABS: 650.0000 mg | ORAL_TABLET | Freq: Four times a day (QID) | ORAL | Status: DC | PRN

## 2017-05-14 MED ORDER — SODIUM CHLORIDE 0.9% FLUSH: 3.0000 mL | INTRAVENOUS | Status: DC | PRN

## 2017-05-14 MED ORDER — RIVAROXABAN 20 MG PO TABS
20.0000 mg | ORAL_TABLET | Freq: Every day | ORAL | Status: DC
  Administered 2017-05-14 – 2017-05-15 (×2): 20 mg via ORAL
  Filled 2017-05-14 (×2): qty 1

## 2017-05-14 MED ORDER — ONDANSETRON HCL 4 MG/2ML IJ SOLN: 4.0000 mg | Freq: Four times a day (QID) | INTRAMUSCULAR | Status: DC | PRN

## 2017-05-14 MED ORDER — SENNOSIDES-DOCUSATE SODIUM 8.6-50 MG PO TABS
1.0000 | ORAL_TABLET | Freq: Every evening | ORAL | Status: DC | PRN
Start: 2017-05-14 — End: 2017-05-15

## 2017-05-14 MED ORDER — ACETAMINOPHEN 650 MG RE SUPP: 650.0000 mg | Freq: Four times a day (QID) | RECTAL | Status: DC | PRN

## 2017-05-14 MED ORDER — CEFAZOLIN SODIUM-DEXTROSE 2-4 GM/100ML-% IV SOLN
2.0000 g | Freq: Three times a day (TID) | INTRAVENOUS | Status: DC
  Administered 2017-05-14 – 2017-05-15 (×4): 2 g via INTRAVENOUS
  Filled 2017-05-14 (×6): qty 100

## 2017-05-14 MED ORDER — ONDANSETRON HCL 4 MG PO TABS: 4.0000 mg | ORAL_TABLET | Freq: Four times a day (QID) | ORAL | Status: DC | PRN

## 2017-05-14 MED ORDER — LISINOPRIL 10 MG PO TABS
5.0000 mg | ORAL_TABLET | Freq: Every day | ORAL | Status: DC
  Administered 2017-05-14 – 2017-05-15 (×2): 5 mg via ORAL
  Filled 2017-05-14 (×2): qty 1

## 2017-05-14 MED ORDER — SODIUM CHLORIDE 0.9 % IV SOLN: 250.0000 mL | INTRAVENOUS | Status: DC | PRN

## 2017-05-14 NOTE — H&P (Signed)
History and Physical    Alex [name]:[id number] DOB: 12/29/1950 DOA: 05/13/2017  PCP: Patient, [name] Per   Patient coming from: Home, by way of orthopedist clinic   Chief Complaint: Left leg redness, pain, and swelling  HPI: Alex [name] is a 66 y.o. male with medical history significant for hypertension and history of DVT on Xarelto, now presenting to emergency department for evaluation of pain, erythema, and edema involving the left knee. Patient reports that he been in his usual state of health until he noted some redness and tenderness over the left anterior knee approximately one week ago. He was evaluated by an outpatient physician and started on doxycycline around that time. He returned for follow-up today, reporting increased swelling and pain despite the antibiotic. He was sent to orthopedic surgery clinic where the knee was reportedly aspirated, yielding only a scant amount of clear fluid. Per report, the orthopedist was concern for septic arthritis, but recommended that the patient go to the emergency department for evaluation of a possible DVT. Patient reports some chills over the past couple days, but no fevers per se. He denies any chest pain, palpitations, dyspnea, cough, headache, change in vision or hearing, or focal numbness or weakness. He does not have a known history of diabetes. He denies prior skin or soft tissue infections. Reports continued adherence with his Xarelto.  ED Course: Upon arrival to the ED, patient is found to be afebrile, saturating well on room air, and with vitals otherwise stable. Chemistry panels unremarkable and so a CBC. Venous Doppler of the left lower extremity was performed in the emergency department and preliminary read is no DVT. Blood cultures were collected in the ED, patient was started on empiric vancomycin, but developed itching and redness near the infusion site, was treated with Benadryl, and antibiotics were changed to Ancef. Patient  remained hemodynamically stable and in no apparent distress in the ED. He will be observed on medical surgical unit for ongoing evaluation and management of suspected left leg cellulitis with failure appropriate outpatient treatment.  Review of Systems:  All other systems reviewed and apart from HPI, are negative.  Past Medical History:  Diagnosis Date  . DVT (deep venous thrombosis) ([place])   . Hypercholesterolemia   . Hypertension     Past Surgical History:  Procedure Laterality Date  . ANTERIOR FUSION CLIVUS-C2 EXTRAORAL W/ ODONTOID EXCISION    . SHOULDER ARTHROSCOPY       reports that he has quit smoking. His smokeless tobacco use includes Snuff. He reports that he drinks alcohol. He reports that he does not use drugs.  Allergies  Allergen Reactions  . Vancomycin Hives    History reviewed. No pertinent family history.   Prior to Admission medications   Medication Sig Start Date End Date Taking? Authorizing Provider  doxycycline (VIBRA-TABS) 100 MG tablet Take 100 mg by mouth daily. 05/09/17  Yes [provider]  lisinopril (PRINIVIL,ZESTRIL) 5 MG tablet Take 5 mg by mouth daily.    Yes [provider]  naproxen (NAPROSYN) 500 MG tablet Take 500 mg by mouth daily. 05/09/17  Yes [provider]  rivaroxaban (XARELTO) 20 MG TABS tablet Take 20 mg by mouth daily.    Yes [provider]    Physical Exam: Vitals:   05/13/17 2330 05/13/17 2331 05/14/17 0015 05/14/17 0100  BP: 130/72 130/72 120/67 110/82  Pulse: 77 70 66 75  Resp:  16    Temp:      TempSrc:  SpO2: 96% 97% 97% 94%  Weight:      Height:          Constitutional: NAD, calm, comfortable Eyes: PERTLA, lids and conjunctivae normal ENMT: Mucous membranes are moist. Posterior pharynx clear of any exudate or lesions.   Neck: normal, supple, no masses, no thyromegaly Respiratory: clear to auscultation bilaterally, no wheezing, no crackles. Normal respiratory effort.    Cardiovascular: S1 & S2 heard, regular rate and rhythm. No right leg edema. No significant JVD. Abdomen: No distension, no tenderness, no masses palpated. Bowel sounds active.  Musculoskeletal: no clubbing / cyanosis. Swelling and tenderness to left knee and lower leg.  Skin: Intense erythema, edema, heat, and tenderness centered over left anterior knee without appreciable fluctuance or drainage. Skin is otherwise warm, dry, well-perfused. Neurologic: CN 2-12 grossly intact. Sensation intact. Strength 5/5 in all 4 limbs.  Psychiatric: Alert and oriented x 3. Odd affect.     Labs on Admission: I have personally reviewed following labs and imaging studies  CBC:  Recent Labs Lab 05/13/17 2245  WBC 7.1  NEUTROABS 3.2  HGB 14.6  HCT 44.3  MCV 92.9  PLT 676   Basic Metabolic Panel:  Recent Labs Lab 05/13/17 2310  NA 138  K 4.0  CL 107  CO2 26  GLUCOSE 118*  BUN 20  CREATININE 0.72  CALCIUM 8.2*   GFR: Estimated Creatinine Clearance: 124.5 mL/min (by C-G formula based on SCr of 0.72 mg/dL). Liver Function Tests: No results for input(s): AST, ALT, ALKPHOS, BILITOT, PROT, ALBUMIN in the last 168 hours. No results for input(s): LIPASE, AMYLASE in the last 168 hours. No results for input(s): AMMONIA in the last 168 hours. Coagulation Profile: No results for input(s): INR, PROTIME in the last 168 hours. Cardiac Enzymes: No results for input(s): CKTOTAL, CKMB, CKMBINDEX, TROPONINI in the last 168 hours. BNP (last 3 results) No results for input(s): PROBNP in the last 8760 hours. HbA1C: No results for input(s): HGBA1C in the last 72 hours. CBG: No results for input(s): GLUCAP in the last 168 hours. Lipid Profile: No results for input(s): CHOL, HDL, LDLCALC, TRIG, CHOLHDL, LDLDIRECT in the last 72 hours. Thyroid Function Tests: No results for input(s): TSH, T4TOTAL, FREET4, T3FREE, THYROIDAB in the last 72 hours. Anemia Panel: No results for input(s): VITAMINB12, FOLATE,  FERRITIN, TIBC, IRON, RETICCTPCT in the last 72 hours. Urine analysis: No results found for: COLORURINE, APPEARANCEUR, LABSPEC, PHURINE, GLUCOSEU, HGBUR, BILIRUBINUR, KETONESUR, PROTEINUR, UROBILINOGEN, NITRITE, LEUKOCYTESUR Sepsis Labs: @LABRCNTIP (procalcitonin:4,lacticidven:4) )No results found for this or any previous visit (from the past 240 hour(s)).   Radiological Exams on Admission: No results found.  EKG: Not performed.   Assessment/Plan  1. Cellulitis of left leg  - Pt presents with persistent pain, swelling, and erythema of left knee and lower leg despite treatment with doxycyline  - Was seen in orthopedic surgery clinic prior to arrival in ED, and reportedly had a scant amount of clear fluid aspirated from left knee and was sent to ED for r/o DVT  - No DVT on preliminary read of venous US  - Treated with vancomycin in ED after cultures collected, but developed itching and redness near the IV site and was switched to Ancef  - Plan to continue empiric abx with Ancef 2 g IV q8h, elevate the leg, follow cultures and clinical response to treatment    2. Hx of DVT  - Presents with LLE swelling, pain, and redness  - Venous US performed on LLE with official read  pending; preliminary read is no DVT  - Continue Xarelto   3. Hypertension  - BP is at goal  - Continue lisinopril    DVT prophylaxis: sq Lovenox Code Status: Full  Family Communication: Discussed with patient Disposition Plan: Observe on med-surg Consults called: None Admission status: Observation     Vianne Bulls, MD [facility] Pager [phone]  If 7PM-7AM, please contact night-coverage [email or web address] Password [email or web address]  05/14/2017, 1:29 AM

## 2017-05-14 NOTE — ED Notes (Signed)
Attempted to call report to floor, No answer; RN to call back

## 2017-05-14 NOTE — Consult Note (Signed)
ORTHOPAEDIC CONSULTATION  REQUESTING PHYSICIAN: Rosita Fire, MD  PCP:  Patient, [name] Per  Chief Complaint: Evaluate left knee  HPI: Alex [name] is a 66 y.o. male who complains of approximately 1 week of left knee pain and swelling. Patient does have a history of on provoke lower extremity DVT 2. He is currently on xarelto. He developed cellulitis to the left knee about one week ago. This was managed by his primary care doctor with IM Rocephin and by mouth doxycycline. The erythema did not improve. The patient was seen by myself in the office yesterday. I aspirated his left knee in order to rule out septic arthritis, and I retrieved a couple cc of clear, straw-colored joint fluid that was completely benign. I recommended that the patient proceed to the emergency department to rule out DVT and IV. He was admitted to the hospitalist service.  The patient is receiving IV antibiotics, and he states that the knee is feeling better.  Past Medical History:  Diagnosis Date  . DVT (deep venous thrombosis) ([place])   . Hypercholesterolemia   . Hypertension    Past Surgical History:  Procedure Laterality Date  . ANTERIOR FUSION CLIVUS-C2 EXTRAORAL W/ ODONTOID EXCISION    . SHOULDER ARTHROSCOPY     Social History   Social History  . Marital status: Married    Spouse name: N/A  . Number of children: N/A  . Years of education: N/A   Social History Main Topics  . Smoking status: Former Research scientist (life sciences)  . Smokeless tobacco: Current User    Types: Snuff  . Alcohol use Yes     Comment: occassional  . Drug use: No  . Sexual activity: Not Asked   Other Topics Concern  . None   Social History Narrative  . None   History reviewed. No pertinent family history. Allergies  Allergen Reactions  . Vancomycin Hives   Prior to Admission medications   Medication Sig Start Date End Date Taking? Authorizing Provider  doxycycline (VIBRA-TABS) 100 MG tablet Take 100 mg by mouth daily. 05/09/17   Yes [provider]  lisinopril (PRINIVIL,ZESTRIL) 5 MG tablet Take 5 mg by mouth daily.    Yes [provider]  naproxen (NAPROSYN) 500 MG tablet Take 500 mg by mouth daily. 05/09/17  Yes [provider]  rivaroxaban (XARELTO) 20 MG TABS tablet Take 20 mg by mouth daily.    Yes [provider]   No results found.  Positive ROS: All other systems have been reviewed and were otherwise negative with the exception of those mentioned in the HPI and as above.  Physical Exam: General: Alert, no acute distress Cardiovascular: No pedal edema Respiratory: No cyanosis, no use of accessory musculature GI: No organomegaly, abdomen is soft and non-tender Skin: No lesions in the area of chief complaint Neurologic: Sensation intact distally Psychiatric: Patient is competent for consent with normal mood and affect Lymphatic: No axillary or cervical lymphadenopathy  MUSCULOSKELETAL:  Examination of the left lower extremity reveals no skin wounds or lesions. He does have erythema to the left knee that has receded from the marked region. There is no effusion. There is no fluctuance or palpable abscess. He does have lower extremity edema which is unchanged from yesterday. He is neurovascularly intact.  Assessment: Left lower extremity swelling Left knee cellulitis History of lower extremity DVT  Plan: I discussed the findings with the patient. He does not have a drainable abscess or other acute surgical need. I recommend  continued IV antibiotics to treat his cellulitis. With his significant history of lower extremity DVT, I would recommend CT of the pelvis to rule out DVT of the iliac vein. Call with further questions.   Alane Hanssen, Horald Pollen, MD Cell [phone]    05/14/2017 7:38 PM

## 2017-05-14 NOTE — Progress Notes (Signed)
Patient was seen and examined at bedside. Patient was admitted earlier today by Dr. Myna Hidalgo. Please see H&P for detail.  66 year old male with history of DVT on Xarelto sent by orthopedic clinic for the evaluation of left knee cellulitis. Patient reported that he took a week of antibiotics with no clinical improvement. He was seen by orthopedics yesterday when they did knee arthrocentesis. I spoke with Dr.Swinteck from orthopedics. As per him, the x-ray of knee and the fluid is studies looks normal. He was sent to ER for treatment of cellulitis and rule out DVT.  Doppler ultrasound preliminary report with no evidence of DVT. Patient is still with left knee erythema, warmth, tenderness consistent with knee cellulitis. Failed outpatient oral antibiotics. Plan to continue IV antibiotics today. Continue supportive care.

## 2017-05-14 NOTE — ED Notes (Signed)
Recollect light green tube for bmp

## 2017-05-15 DIAGNOSIS — I10 Essential (primary) hypertension: Secondary | ICD-10-CM | POA: Diagnosis not present

## 2017-05-15 DIAGNOSIS — L03116 Cellulitis of left lower limb: Secondary | ICD-10-CM | POA: Diagnosis not present

## 2017-05-15 LAB — GLUCOSE, CAPILLARY: Glucose-Capillary: 82 mg/dL (ref 65–99)

## 2017-05-15 MED ORDER — CEPHALEXIN 500 MG PO CAPS: 500.0000 mg | ORAL_CAPSULE | Freq: Two times a day (BID) | ORAL | 0 refills | Status: AC

## 2017-05-15 NOTE — Discharge Summary (Signed)
Physician Discharge Summary  Alex [name] KYH:[id number] DOB: [birth year] DOA: 05/13/2017  PCP: Alex [name], [name] Per  Admit date: 05/13/2017 Discharge date: 05/15/2017  Admitted From:home Disposition:home  Recommendations for Outpatient Follow-up:  1. Follow up with PCP in 1-2 weeks 2. Please obtain BMP/CBC in one week   Home Health:no Equipment/Devices:none Discharge Condition:stable CODE STATUS:full code Diet recommendation:heart healthy  Brief/Interim Summary: 66 year old male with history of DVT on Xarelto sent by orthopedic clinic for the evaluation of left knee cellulitis. Alex [name] reported that he took a week of antibiotics with no clinical improvement. He was seen by orthopedics where he had knee arthrocentesis which was unremarkable and negative for infection and [place] as per orthopedics. It is likely left knee cellulitis failed outpatient antibiotics treatment. Doppler ultrasound negative for DVT. Treated with IV cefazolin in the hospital with  improvement pain. The joint erythema has improved compared to yesterday. Alex [name] denied heat pain or thigh pain. He wants to go home today with oral antibiotics. No fever or chills. Cultures negative so far. He can walk without difficulties. The swelling has improved. Planning to discharge home with oral Keflex for 10 days. Recommended yogurt or probiotics while on antibiotics. Recommended to follow up with PCP in a week. He verbalized understanding.  He was continued on home medications.  Discharge Diagnoses:  Principal Problem:   Cellulitis of left leg Active Problems:   Personal history of DVT (deep vein thrombosis)   Hypertension    Discharge Instructions  Discharge Instructions    Call MD for:  difficulty breathing, headache or visual disturbances    Complete by:  As directed    Call MD for:  extreme fatigue    Complete by:  As directed    Call MD for:  hives    Complete by:  As directed    Call MD for:  persistant  dizziness or light-headedness    Complete by:  As directed    Call MD for:  persistant nausea and vomiting    Complete by:  As directed    Call MD for:  severe uncontrolled pain    Complete by:  As directed    Call MD for:  temperature >100.4    Complete by:  As directed    Diet - low sodium heart healthy    Complete by:  As directed    Discharge instructions    Complete by:  As directed    Please take yogurt or probiotics while on antibiotics.   Increase activity slowly    Complete by:  As directed      Allergies as of 05/15/2017      Reactions   Vancomycin Hives      Medication List    STOP taking these medications   doxycycline 100 MG tablet Commonly known as:  VIBRA-TABS     TAKE these medications   cephALEXin 500 MG capsule Commonly known as:  KEFLEX Take 1 capsule (500 mg total) by mouth 2 (two) times daily.   lisinopril 5 MG tablet Commonly known as:  PRINIVIL,ZESTRIL Take 5 mg by mouth daily.   naproxen 500 MG tablet Commonly known as:  NAPROSYN Take 500 mg by mouth daily.   XARELTO 20 MG Tabs tablet Generic drug:  rivaroxaban Take 20 mg by mouth daily.            Discharge Care Instructions        Start     Ordered   05/15/17 0000  cephALEXin (KEFLEX) 500 MG  capsule  2 times daily     05/15/17 0943   05/15/17 0000  Increase activity slowly     05/15/17 0943   05/15/17 0000  Diet - low sodium heart healthy     05/15/17 0943   05/15/17 0000  Discharge instructions    Comments:  Please take yogurt or probiotics while on antibiotics.   05/15/17 0943   05/15/17 0000  Call MD for:  temperature >100.4     05/15/17 0943   05/15/17 0000  Call MD for:  persistant nausea and vomiting     05/15/17 0943   05/15/17 0000  Call MD for:  severe uncontrolled pain     05/15/17 0943   05/15/17 0000  Call MD for:  difficulty breathing, headache or visual disturbances     05/15/17 0943   05/15/17 0000  Call MD for:  hives     05/15/17 0943   05/15/17 0000   Call MD for:  persistant dizziness or light-headedness     05/15/17 0943   05/15/17 0000  Call MD for:  extreme fatigue     05/15/17 0943     Follow-up Information    Swinteck, Aaron Edelman, MD Follow up.   Specialty:  Orthopedic Surgery Why:  as needed Contact information: [address]. [address] [place] [address] [phone]          Allergies  Allergen Reactions  . Vancomycin Hives    Consultations: Orthopedics  Procedures/Studies: None  Subjective: Seen and examined at bedside. I wanted to go home today. Denied any pain or swelling. Redness is improving compared to yesterday. Denied fever, chills, nausea vomiting or headache.  Discharge Exam: Vitals:   05/14/17 2148 05/15/17 0617  BP: (!) 112/49 135/79  Pulse: 74 64  Resp: 16   Temp: 97.8 F (36.6 C) 98.1 F (36.7 C)  SpO2: 98% 98%   Vitals:   05/14/17 0531 05/14/17 1245 05/14/17 2148 05/15/17 0617  BP: 123/68 114/68 (!) 112/49 135/79  Pulse: 75 75 74 64  Resp: 17 16 16    Temp: 97.8 F (36.6 C) 98.6 F (37 C) 97.8 F (36.6 C) 98.1 F (36.7 C)  TempSrc: Oral Oral Oral Oral  SpO2: 99% 98% 98% 98%  Weight:      Height:        General: Pt is alert, awake, not in acute distress Cardiovascular: RRR, S1/S2 +, no rubs, no gallops Respiratory: CTA bilaterally, no wheezing, no rhonchi Abdominal: Soft, NT, ND, bowel sounds + Extremities: Left knee redness present but better than yesterday. No swelling or tenderness. No lower extremity edema.    The results of significant diagnostics from this hospitalization (including imaging, microbiology, ancillary and laboratory) are listed below for reference.     Microbiology: Recent Results (from the past 240 hour(s))  MRSA PCR Screening     Status: None   Collection Time: 05/14/17  4:09 PM  Result Value Ref Range Status   MRSA by PCR NEGATIVE NEGATIVE Final    Comment:        The GeneXpert MRSA Assay (FDA approved for NASAL specimens only), is one  component of a comprehensive MRSA colonization surveillance program. It is not intended to diagnose MRSA infection nor to guide or monitor treatment for MRSA infections.      Labs: BNP (last 3 results) No results for input(s): BNP in the last 8760 hours. Basic Metabolic Panel:  Recent Labs Lab 05/13/17 2310 05/14/17 0059 05/14/17 0526  NA 138 138 138  K 4.0 3.5 3.7  CL 107 108 109  CO2 26 23 25   GLUCOSE 118* 95 97  BUN 20 19 19   CREATININE 0.72 0.80 0.74  CALCIUM 8.2* 8.4* 8.1*   Liver Function Tests: No results for input(s): AST, ALT, ALKPHOS, BILITOT, PROT, ALBUMIN in the last 168 hours. No results for input(s): LIPASE, AMYLASE in the last 168 hours. No results for input(s): AMMONIA in the last 168 hours. CBC:  Recent Labs Lab 05/13/17 2245 05/14/17 0526  WBC 7.1 6.1  NEUTROABS 3.2  --   HGB 14.6 13.4  HCT 44.3 40.6  MCV 92.9 92.7  PLT 164 211   Cardiac Enzymes: No results for input(s): CKTOTAL, CKMB, CKMBINDEX, TROPONINI in the last 168 hours. BNP: Invalid input(s): POCBNP CBG:  Recent Labs Lab 05/14/17 0625 05/14/17 1214 05/15/17 0619  GLUCAP 92 85 82   D-Dimer No results for input(s): DDIMER in the last 72 hours. Hgb A1c No results for input(s): HGBA1C in the last 72 hours. Lipid Profile No results for input(s): CHOL, HDL, LDLCALC, TRIG, CHOLHDL, LDLDIRECT in the last 72 hours. Thyroid function studies No results for input(s): TSH, T4TOTAL, T3FREE, THYROIDAB in the last 72 hours.  Invalid input(s): FREET3 Anemia work up No results for input(s): VITAMINB12, FOLATE, FERRITIN, TIBC, IRON, RETICCTPCT in the last 72 hours. Urinalysis No results found for: COLORURINE, APPEARANCEUR, [place], [place], [place], [place], [place], [place], PROTEINUR, UROBILINOGEN, NITRITE, LEUKOCYTESUR Sepsis Labs Invalid input(s): PROCALCITONIN,  WBC,  LACTICIDVEN Microbiology Recent Results (from the past 240 hour(s))  MRSA PCR Screening     Status: None    Collection Time: 05/14/17  4:09 PM  Result Value Ref Range Status   MRSA by PCR NEGATIVE NEGATIVE Final    Comment:        The GeneXpert MRSA Assay (FDA approved for NASAL specimens only), is one component of a comprehensive MRSA colonization surveillance program. It is not intended to diagnose MRSA infection nor to guide or monitor treatment for MRSA infections.      Time coordinating discharge: 27 minutes  SIGNED:   Rosita Fire, MD  [facility] 05/15/2017, 9:43 AM  If 7PM-7AM, please contact night-coverage [email or web address] Password [email or web address]

## 2017-05-19 LAB — CULTURE, BLOOD (ROUTINE X 2)
CULTURE: NO GROWTH
CULTURE: NO GROWTH
Special Requests: ADEQUATE
Special Requests: ADEQUATE

## 2017-06-17 DIAGNOSIS — H90A31 Mixed conductive and sensorineural hearing loss, unilateral, right ear with restricted hearing on the contralateral side: Secondary | ICD-10-CM | POA: Diagnosis not present

## 2017-06-17 DIAGNOSIS — H6123 Impacted cerumen, bilateral: Secondary | ICD-10-CM | POA: Diagnosis not present

## 2017-06-24 DIAGNOSIS — H6123 Impacted cerumen, bilateral: Secondary | ICD-10-CM | POA: Diagnosis not present

## 2017-06-24 DIAGNOSIS — L298 Other pruritus: Secondary | ICD-10-CM | POA: Diagnosis not present

## 2017-08-11 DIAGNOSIS — Z125 Encounter for screening for malignant neoplasm of prostate: Secondary | ICD-10-CM | POA: Diagnosis not present

## 2017-08-11 DIAGNOSIS — Z0000 Encounter for general adult medical examination without abnormal findings: Secondary | ICD-10-CM | POA: Diagnosis not present

## 2017-08-11 DIAGNOSIS — I10 Essential (primary) hypertension: Secondary | ICD-10-CM | POA: Diagnosis not present

## 2017-08-18 ENCOUNTER — Encounter: Payer: Self-pay | Admitting: Nurse Practitioner

## 2017-08-18 DIAGNOSIS — M5090 Cervical disc disorder, unspecified, unspecified cervical region: Secondary | ICD-10-CM | POA: Diagnosis not present

## 2017-08-18 DIAGNOSIS — Z23 Encounter for immunization: Secondary | ICD-10-CM | POA: Diagnosis not present

## 2017-08-18 DIAGNOSIS — Z8249 Family history of ischemic heart disease and other diseases of the circulatory system: Secondary | ICD-10-CM | POA: Diagnosis not present

## 2017-08-18 DIAGNOSIS — Z86718 Personal history of other venous thrombosis and embolism: Secondary | ICD-10-CM | POA: Diagnosis not present

## 2017-08-18 DIAGNOSIS — L03116 Cellulitis of left lower limb: Secondary | ICD-10-CM | POA: Diagnosis not present

## 2017-08-18 DIAGNOSIS — Z1389 Encounter for screening for other disorder: Secondary | ICD-10-CM | POA: Diagnosis not present

## 2017-08-18 DIAGNOSIS — Z0000 Encounter for general adult medical examination without abnormal findings: Secondary | ICD-10-CM | POA: Diagnosis not present

## 2017-08-25 DIAGNOSIS — Z1212 Encounter for screening for malignant neoplasm of rectum: Secondary | ICD-10-CM | POA: Diagnosis not present

## 2017-09-12 ENCOUNTER — Ambulatory Visit (INDEPENDENT_AMBULATORY_CARE_PROVIDER_SITE_OTHER): Payer: BLUE CROSS/BLUE SHIELD | Admitting: Nurse Practitioner

## 2017-09-12 ENCOUNTER — Encounter (INDEPENDENT_AMBULATORY_CARE_PROVIDER_SITE_OTHER): Payer: Self-pay

## 2017-09-12 ENCOUNTER — Telehealth: Payer: Self-pay | Admitting: Nurse Practitioner

## 2017-09-12 ENCOUNTER — Telehealth: Payer: Self-pay

## 2017-09-12 ENCOUNTER — Encounter: Payer: Self-pay | Admitting: Nurse Practitioner

## 2017-09-12 VITALS — BP 130/90 | HR 63 | Ht 74.0 in | Wt 237.6 lb

## 2017-09-12 DIAGNOSIS — Z1211 Encounter for screening for malignant neoplasm of colon: Secondary | ICD-10-CM | POA: Diagnosis not present

## 2017-09-12 DIAGNOSIS — Z7901 Long term (current) use of anticoagulants: Secondary | ICD-10-CM

## 2017-09-12 MED ORDER — PEG-KCL-NACL-NASULF-NA ASC-C 140 G PO SOLR: 1.0000 | ORAL | 0 refills | Status: DC

## 2017-09-12 NOTE — Telephone Encounter (Signed)
[facility] [address] [place], [place]  [address] Phone:  [phone]   Fax:  [phone]  09/12/2017   RE:      Alex [name] DOB:   [birth year] MRN:   [id number]   Dear Dr. Ardeth Perfect,    We have scheduled the above patient for an endoscopic procedure. Our records show that he is on anticoagulation therapy.   Please advise as to how long the patient may come off his therapy of Xarelto prior to the colonoscopy procedure, which is scheduled for 10/27/17.  Please fax back/ or route the completed form to [facility] at [phone].   Sincerely,    Thurmon Fair, RMA

## 2017-09-12 NOTE — Telephone Encounter (Signed)
New instructions with new dates and times mailed to patient.

## 2017-09-12 NOTE — Patient Instructions (Signed)
If you are age 66 or older, your body mass index should be between 23-30. Your Body mass index is 30.51 kg/m. If this is out of the aforementioned range listed, please consider follow up with your Primary Care Provider.  If you are age 90 or younger, your body mass index should be between 19-25. Your Body mass index is 30.51 kg/m. If this is out of the aformentioned range listed, please consider follow up with your Primary Care Provider.   You have been scheduled for a colonoscopy. Please follow written instructions given to you at your visit today.  Please pick up your prep supplies at the pharmacy within the next 1-3 days. If you use inhalers (even only as needed), please bring them with you on the day of your procedure. Your physician has requested that you go to www.startemmi.com and enter the access code given to you at your visit today. This web site gives a general overview about your procedure. However, you should still follow specific instructions given to you by our office regarding your preparation for the procedure.  We have sent the following medications to your pharmacy for you to pick up at your convenience: Plenvu  You will be contacted by our office prior to your procedure for directions on holding your Xarelto.  If you do not hear from our office 1 week prior to your scheduled procedure, please call [phone] to discuss.   Thank you for choosing me and [facility].   Tye Savoy, NP       .  Marland Kitchendo

## 2017-09-12 NOTE — Progress Notes (Signed)
Chief Complaint: Colon cancer screening  Referring Provider:  Velna Hatchet, MD    ASSESSMENT AND PLAN;   17.  67 year old male for colon cancer screening.  No rectal bleeding, bowel changes, anemia or other alarm signs / symptoms. -Patient will be scheduled for a screening colonoscopy with possible polypectomy.  The risks and benefits of the procedure were discussed and the patient agrees to proceed.   2.  Chronic anticoagulation.  History of DVT x2, last one approximately 9 months ago.  -Hold Xarelto for 2 days before procedure - will instruct when and how to resume after procedure. Patient understands that there is a low but real risk of cardiovascular event such as heart attack, stroke, embolism, thrombosis or ischemia/infarct while off Xarelto. The patient consents to proceed. Will communicate by phone or EMR with patient's prescribing provider to confirm that holding Xarelto is reasonable in this case.    HPI:    Patient is a 67 year old male referred by PCP for colon cancer screening.  He has no GI complaints.  Specifically, no bowel changes, blood in stool, abdominal pain or involuntary weight loss.  Recent labs from 08/11/2017 revealed normal white count, normal hemoglobin of 15.9, MCV normal at 93.  Liver chemistries basically normal, total bilirubin minimally elevated at 1.6.  Renal function normal.  TSH normal at 0.56.  Patient was diagnosed with a DVT 6 years ago.  He was treated with warfarin for a while.  No problems for years until about 9 months ago when he was found to have a DVT in the other leg.  Patient has been on Xarelto since second DVT diagnosed.   Patient has no GI or general medical complaints except for chronic headaches and chronic neck pain.  No chest pain, SOB. He has had neck surgery and has a titanium plate and screws in neck  Past Medical History:  Diagnosis Date  . DVT (deep venous thrombosis) ([place])   . Hypercholesterolemia   . Hypertension       Past Surgical History:  Procedure Laterality Date  . ANTERIOR FUSION CLIVUS-C2 EXTRAORAL W/ ODONTOID EXCISION    . SHOULDER ARTHROSCOPY     Family History  Problem Relation Age of Onset  . Breast cancer Mother    Social History   Tobacco Use  . Smoking status: Former Research scientist (life sciences)  . Smokeless tobacco: Current User    Types: Snuff  Substance Use Topics  . Alcohol use: Yes    Comment: occassional  . Drug use: No   Current Outpatient Medications  Medication Sig Dispense Refill  . lisinopril (PRINIVIL,ZESTRIL) 5 MG tablet Take 5 mg by mouth daily.     . naproxen (NAPROSYN) 500 MG tablet Take 500 mg by mouth daily.    . rivaroxaban (XARELTO) 20 MG TABS tablet Take 20 mg by mouth daily.      No current facility-administered medications for this visit.    Allergies  Allergen Reactions  . Vancomycin Hives    Review of Systems: All systems reviewed and negative except where noted in HPI.    Physical Exam:    BP 130/90   Pulse 63   Ht 6\' 2"  (1.88 m)   Wt 237 lb 9.6 oz (107.8 kg)   SpO2 95%   BMI 30.51 kg/m  Constitutional:  Well-developed, white male in no acute distress. Psychiatric: Normal mood and affect. Behavior is normal. EENT: Pupils normal.  Conjunctivae are normal. No scleral icterus. Neck supple.  Cardiovascular: Normal rate, regular  rhythm. No edema Pulmonary/chest: Effort normal and breath sounds normal. No wheezing, rales or rhonchi. Abdominal: Soft, nondistended. Nontender. Bowel sounds active throughout. There are no masses palpable. No hepatomegaly. Neurological: Alert and oriented to person place and time. Skin: Skin is warm and dry. No rashes noted.  Tye Savoy, NP  09/12/2017, 10:04 AM  Cc:  Velna Hatchet, MD

## 2017-09-13 NOTE — Telephone Encounter (Signed)
We have never seen this patient in cardiology. Please forward to appropriate MD for clearance (pt takes Xarelto for DVT so PCP is most likely following).

## 2017-09-13 NOTE — Progress Notes (Signed)
Agree with advanced practitioner's assessment and plans as outlined including confirming with his physician that it is acceptable to hold oral anticoagulation short term

## 2017-09-14 NOTE — Telephone Encounter (Signed)
As below recommendations.

## 2017-09-23 DIAGNOSIS — M542 Cervicalgia: Secondary | ICD-10-CM | POA: Diagnosis not present

## 2017-09-23 DIAGNOSIS — M5030 Other cervical disc degeneration, unspecified cervical region: Secondary | ICD-10-CM | POA: Diagnosis not present

## 2017-09-23 DIAGNOSIS — M4722 Other spondylosis with radiculopathy, cervical region: Secondary | ICD-10-CM | POA: Diagnosis not present

## 2017-09-23 DIAGNOSIS — R51 Headache: Secondary | ICD-10-CM | POA: Diagnosis not present

## 2017-09-27 DIAGNOSIS — M4722 Other spondylosis with radiculopathy, cervical region: Secondary | ICD-10-CM | POA: Diagnosis not present

## 2017-09-27 DIAGNOSIS — M4802 Spinal stenosis, cervical region: Secondary | ICD-10-CM | POA: Diagnosis not present

## 2017-09-30 NOTE — Telephone Encounter (Signed)
Received instructions from Dr. Ardeth Perfect regarding holding Xarelto 48 hours prior to procedure and to resume as soon as able post-op.  I left a message on patients voicemail with these instructions.  Requested that patient me back to confirm receipt of message.  Instruction per Dr. Ardeth Perfect scanned to chart.

## 2017-10-05 NOTE — Telephone Encounter (Signed)
Patient returned call.  Given instructions to hold Xarelto 48 hours before procedure and to resume ASAP post procedure.  Patient verbalized understanding.  Procedure moved to 11/21/17.

## 2017-10-10 ENCOUNTER — Encounter: Payer: Self-pay | Admitting: Internal Medicine

## 2017-10-10 DIAGNOSIS — M542 Cervicalgia: Secondary | ICD-10-CM | POA: Diagnosis not present

## 2017-10-10 DIAGNOSIS — M5030 Other cervical disc degeneration, unspecified cervical region: Secondary | ICD-10-CM | POA: Diagnosis not present

## 2017-10-10 DIAGNOSIS — M5020 Other cervical disc displacement, unspecified cervical region: Secondary | ICD-10-CM | POA: Diagnosis not present

## 2017-10-10 DIAGNOSIS — R51 Headache: Secondary | ICD-10-CM | POA: Diagnosis not present

## 2017-10-11 ENCOUNTER — Other Ambulatory Visit: Payer: Self-pay | Admitting: Neurosurgery

## 2017-10-27 ENCOUNTER — Encounter: Payer: Self-pay | Admitting: Internal Medicine

## 2017-11-16 ENCOUNTER — Telehealth: Payer: Self-pay | Admitting: Oncology

## 2017-11-16 NOTE — Telephone Encounter (Signed)
Received a call from [facility] at Dr. Hoover Brunette office to schedule an urgent appt. Pt has been scheduled to see Dr. Alen Blew on 3/21 at 2pm. Ebony Hail will notify the pt. Aware the pt should arrive 30 minutes early.

## 2017-11-17 ENCOUNTER — Inpatient Hospital Stay: Payer: BLUE CROSS/BLUE SHIELD | Attending: Oncology | Admitting: Oncology

## 2017-11-17 VITALS — BP 141/79 | HR 66 | Temp 98.0°F | Resp 18 | Ht 74.0 in | Wt 235.6 lb

## 2017-11-17 DIAGNOSIS — Z7901 Long term (current) use of anticoagulants: Secondary | ICD-10-CM | POA: Insufficient documentation

## 2017-11-17 DIAGNOSIS — Z86718 Personal history of other venous thrombosis and embolism: Secondary | ICD-10-CM | POA: Insufficient documentation

## 2017-11-17 DIAGNOSIS — I10 Essential (primary) hypertension: Secondary | ICD-10-CM | POA: Diagnosis not present

## 2017-11-17 NOTE — Pre-Procedure Instructions (Signed)
REMIJIO [name]  11/17/2017      [facility] [place], [address] AT [address] [place] [address] Phone: [phone] Fax: [phone]    Your procedure is scheduled on  Monday 11/28/17  Report to [facility] Admitting at 530 A.M.  Call this number if you have problems the morning of surgery:  [phone]   Remember:  Do not eat food or drink liquids after midnight.  Take these medicines the morning of surgery with A SIP OF WATER - NONE  7 days prior to surgery STOP taking any Aspirin(unless otherwise instructed by your surgeon), Aleve, Naproxen, Ibuprofen, Motrin, Advil, Goody's, BC's, all herbal medications, fish oil, and all vitamins STOP XARELTO AS DIRECTED BY PHYSICIAN.   Do not wear jewelry, make-up or nail polish.  Do not wear lotions, powders, or perfumes, or deodorant.  Do not shave 48 hours prior to surgery.  Men may shave face and neck.  Do not bring valuables to the hospital.  [facility] is not responsible for any belongings or valuables.  Contacts, dentures or bridgework may not be worn into surgery.  Leave your suitcase in the car.  After surgery it may be brought to your room.  For patients admitted to the hospital, discharge time will be determined by your treatment team.  Patients discharged the day of surgery will not be allowed to drive home.   Name and phone number of your driver:    Special instructions:  [place] - Preparing for Surgery  Before surgery, you can play an important role.  Because skin is not sterile, your skin needs to be as free of germs as possible.  You can reduce the number of germs on you skin by washing with CHG (chlorahexidine gluconate) soap before surgery.  CHG is an antiseptic cleaner which kills germs and bonds with the skin to continue killing germs even after washing.  Please DO NOT use if you have an allergy to CHG or antibacterial soaps.   If your skin becomes reddened/irritated stop using the CHG and inform your nurse when you arrive at Short Stay.  Do not shave (including legs and underarms) for at least 48 hours prior to the first CHG shower.  You may shave your face.  Please follow these instructions carefully:   1.  Shower with CHG Soap the night before surgery and the                                morning of Surgery.  2.  If you choose to wash your hair, wash your hair first as usual with your       normal shampoo.  3.  After you shampoo, rinse your hair and body thoroughly to remove the                      Shampoo.  4.  Use CHG as you would any other liquid soap.  You can apply chg directly       to the skin and wash gently with scrungie or a clean washcloth.  5.  Apply the CHG Soap to your body ONLY FROM THE NECK DOWN.        Do not use on open wounds or open sores.  Avoid contact with your eyes,  ears, mouth and genitals (private parts).  Wash genitals (private parts)       with your normal soap.  6.  Wash thoroughly, paying special attention to the area where your surgery        will be performed.  7.  Thoroughly rinse your body with warm water from the neck down.  8.  DO NOT shower/wash with your normal soap after using and rinsing off       the CHG Soap.  9.  Pat yourself dry with a clean towel.            10.  Wear clean pajamas.            11.  Place clean sheets on your bed the night of your first shower and do not        sleep with pets.  Day of Surgery  Do not apply any lotions/deoderants the morning of surgery.  Please wear clean clothes to the hospital/surgery center.     Please read over the following fact sheets that you were given. MRSA Information and Surgical Site Infection Prevention

## 2017-11-17 NOTE — Progress Notes (Signed)
Reason for Referral: Deep vein thrombosis.  HPI: This is a pleasant 67 year old gentleman currently agrees where he lived the majority of his life.  He has history of hyperlipidemia and hypertension and currently on lifetime anticoagulation with Xarelto.  His history of deep vein thrombosis.  Back a few years where he was getting his medical care at the [place].  He developed unprovoked right lower extremity thrombosis and was treated with warfarin.  He was on warfarin for a period of time close to 6 months or more.  He is subsequently developed a left deep vein thrombosis in 2017.  At that time he was found to have a thrombus in the left posterior tibial vein in August 2017.  This was also unprovoked and has been on Xarelto since that time.  He is scheduled to have a cervical spine operation planned by Dr. Sherwood Gambler in the near future.  He did have previous neck operation off anticoagulation and did not have any postoperative thrombosis.  He denies any family history of pulmonary embolism or deep vein thrombosis.  He does report headaches and neck pain related to cervical disc disease but no bleeding or thrombosis noted recently.  He is scheduled to have a colonoscopy in the near future.  He does not report any headaches, blurry vision, syncope or seizures. Does not report any fevers, chills or sweats.  Does not report any cough, wheezing or hemoptysis.  Does not report any chest pain, palpitation, orthopnea or leg edema.  Does not report any nausea, vomiting or abdominal pain.  Does not report any constipation or diarrhea.  Does not report any skeletal complaints.    Does not report frequency, urgency or hematuria.  Does not report any skin rashes or lesions. Does not report any heat or cold intolerance.  Does not report any lymphadenopathy or petechiae.  Does not report any anxiety or depression.  Remaining review of systems is negative.    Past Medical History:  Diagnosis Date  . DVT (deep venous  thrombosis) ([place])   . Hypercholesterolemia   . Hypertension   :  Past Surgical History:  Procedure Laterality Date  . ANTERIOR FUSION CLIVUS-C2 EXTRAORAL W/ ODONTOID EXCISION    . SHOULDER ARTHROSCOPY    :   Current Outpatient Medications:  .  lisinopril (PRINIVIL,ZESTRIL) 10 MG tablet, Take 5 mg by mouth daily. , Disp: , Rfl:  .  PEG-KCl-NaCl-NaSulf-Na Asc-C (PLENVU) 140 g SOLR, Take 1 kit by mouth as directed. (Patient not taking: Reported on 11/09/2017), Disp: 1 each, Rfl: 0 .  rivaroxaban (XARELTO) 20 MG TABS tablet, Take 20 mg by mouth daily. , Disp: , Rfl: :  Allergies  Allergen Reactions  . Vancomycin Hives  :  Family History  Problem Relation Age of Onset  . Breast cancer Mother   :  Social History   Socioeconomic History  . Marital status: Married    Spouse name: Not on file  . Number of children: Not on file  . Years of education: Not on file  . Highest education level: Not on file  Occupational History  . Not on file  Social Needs  . Financial resource strain: Not on file  . Food insecurity:    Worry: Not on file    Inability: Not on file  . Transportation needs:    Medical: Not on file    Non-medical: Not on file  Tobacco Use  . Smoking status: Former Research scientist (life sciences)  . Smokeless tobacco: Current User    Types:  Snuff  Substance and Sexual Activity  . Alcohol use: Yes    Comment: occassional  . Drug use: No  . Sexual activity: Not on file  Lifestyle  . Physical activity:    Days per week: Not on file    Minutes per session: Not on file  . Stress: Not on file  Relationships  . Social connections:    Talks on phone: Not on file    Gets together: Not on file    Attends religious service: Not on file    Active member of club or organization: Not on file    Attends meetings of clubs or organizations: Not on file    Relationship status: Not on file  . Intimate partner violence:    Fear of current or ex partner: Not on file    Emotionally abused: Not on  file    Physically abused: Not on file    Forced sexual activity: Not on file  Other Topics Concern  . Not on file  Social History Narrative  . Not on file  :  Pertinent items are noted in HPI.  Exam: Blood pressure (!) 141/79, pulse 66, temperature 98 F (36.7 C), temperature source Oral, resp. rate 18, height '6\' 2"'  (1.88 m), weight 235 lb 9.6 oz (106.9 kg), SpO2 100 %.  ECOG 0 General appearance: alert and cooperative appeared without distress. Head: atraumatic without any abnormalities. Eyes: conjunctivae/corneas clear. PERRL.  Sclera anicteric. Throat: lips, mucosa, and tongue normal; without oral thrush or ulcers. Resp: clear to auscultation bilaterally without rhonchi, wheezes or dullness to percussion. Cardio: regular rate and rhythm, S1, S2 normal, no murmur, click, rub or gallop GI: soft, non-tender; bowel sounds normal; no masses,  no organomegaly Skin: Skin color, texture, turgor normal. No rashes or lesions Lymph nodes: Cervical, supraclavicular, and axillary nodes normal. Neurologic: Grossly normal without any motor, sensory or deep tendon reflexes. Musculoskeletal: No joint deformity or effusion.  CBC    Component Value Date/Time   WBC 6.1 05/14/2017 0526   RBC 4.38 05/14/2017 0526   HGB 13.4 05/14/2017 0526   HCT 40.6 05/14/2017 0526   PLT 211 05/14/2017 0526   MCV 92.7 05/14/2017 0526   MCH 30.6 05/14/2017 0526   MCHC 33.0 05/14/2017 0526   RDW 14.0 05/14/2017 0526   LYMPHSABS 2.6 05/13/2017 2245   MONOABS 0.9 05/13/2017 2245   EOSABS 0.4 05/13/2017 2245   BASOSABS 0.0 05/13/2017 2245     Chemistry      Component Value Date/Time   NA 138 05/14/2017 0526   K 3.7 05/14/2017 0526   CL 109 05/14/2017 0526   CO2 25 05/14/2017 0526   BUN 19 05/14/2017 0526   CREATININE 0.74 05/14/2017 0526      Component Value Date/Time   CALCIUM 8.1 (L) 05/14/2017 0526       Assessment and Plan:   67 year old gentleman with the following issues:  1.   Recurrent lower extremity deep vein thrombosis.  His original episode occurred in the right lower extremity although the details of which are not available to me at this time.  Per his report it was diagnosed in the [place] many years ago.  His left lower extremity deep vein thrombosis was in August 2017 and has been on Xarelto since that time.   Repeat Vascular ultrasound of the lower extremity in September 2018 showed no residual thrombosis.  The natural course of inherited or acquired thrombophilia was discussed with the patient today.  The etiology of  his recurrent thrombosis is unclear.  Given the fact that he had 2 unprovoked deep vein thrombosis, I do not object to lifetime anticoagulation at this time.  Hypercoagulable panel may be helpful in the future to quantify the nature of his thrombophilia.  He does not have any family history of thrombosis and hypercoagulable panel will likely not alter our management of lifetime anticoagulation.  2.  Perioperative management of his anticoagulation: I have no objections to holding Xarelto leading up to his surgery given the increased risk of bleeding could be rather devastating in the setting of cervical spine surgery.  I recommend immediate ambulation and using compression stocking in the immediate postoperative period.  As soon as hemostasis is achieved, low molecular weight heparin for DVT prophylaxis would be recommended.  I recommend restarting Xarelto as soon as he is cleared from a neurosurgical standpoint.  I see no need for an IVC filter placement prior to this surgery.  All his questions were answered today to his satisfaction.  I am happy to address any other issues for him in the future as needed.  30  minutes was spent with the patient face-to-face today.  More than 50% of time was dedicated to patient counseling, education and answering questions regarding his condition.

## 2017-11-18 ENCOUNTER — Encounter (HOSPITAL_COMMUNITY)
Admission: RE | Admit: 2017-11-18 | Discharge: 2017-11-18 | Disposition: A | Discharge status: Home / Self Care | Payer: Medicare Other | Source: Ambulatory Visit | Attending: Neurosurgery | Admitting: Neurosurgery

## 2017-11-18 ENCOUNTER — Encounter (HOSPITAL_COMMUNITY): Payer: Self-pay

## 2017-11-18 ENCOUNTER — Other Ambulatory Visit: Payer: Self-pay

## 2017-11-18 ENCOUNTER — Telehealth: Payer: Self-pay

## 2017-11-18 DIAGNOSIS — Z01810 Encounter for preprocedural cardiovascular examination: Secondary | ICD-10-CM | POA: Diagnosis not present

## 2017-11-18 DIAGNOSIS — I10 Essential (primary) hypertension: Secondary | ICD-10-CM | POA: Diagnosis not present

## 2017-11-18 DIAGNOSIS — Z01812 Encounter for preprocedural laboratory examination: Secondary | ICD-10-CM | POA: Insufficient documentation

## 2017-11-18 HISTORY — DX: Headache: R51

## 2017-11-18 HISTORY — DX: Headache, unspecified: R51.9

## 2017-11-18 LAB — BASIC METABOLIC PANEL
Anion gap: 8 (ref 5–15)
BUN: 11 mg/dL (ref 6–20)
CHLORIDE: 102 mmol/L (ref 101–111)
CO2: 27 mmol/L (ref 22–32)
CREATININE: 0.9 mg/dL (ref 0.61–1.24)
Calcium: 8.9 mg/dL (ref 8.9–10.3)
GFR calc non Af Amer: >60 mL/min (ref >60)
Glucose, Bld: 92 mg/dL (ref 65–99)
Potassium: 3.9 mmol/L (ref 3.5–5.1)
Sodium: 137 mmol/L (ref 135–145)

## 2017-11-18 LAB — SURGICAL PCR SCREEN
MRSA, PCR: NEGATIVE
Staphylococcus aureus: POSITIVE — AB

## 2017-11-18 LAB — CBC
HCT: 45.1 % (ref 39.0–52.0)
Hemoglobin: 15.2 g/dL (ref 13.0–17.0)
MCH: 31.1 pg (ref 26.0–34.0)
MCHC: 33.7 g/dL (ref 30.0–36.0)
MCV: 92.4 fL (ref 78.0–100.0)
PLATELETS: 217 10*3/uL (ref 150–400)
RBC: 4.88 MIL/uL (ref 4.22–5.81)
RDW: 13.5 % (ref 11.5–15.5)
WBC: 7.2 10*3/uL (ref 4.0–10.5)

## 2017-11-18 LAB — TYPE AND SCREEN
ABO/RH(D): O POS
Antibody Screen: NEGATIVE

## 2017-11-18 LAB — ABO/RH: ABO/RH(D): O POS

## 2017-11-18 NOTE — Telephone Encounter (Signed)
Per 3/21 no los °

## 2017-11-18 NOTE — Progress Notes (Signed)
PATIENT STATED HIS LAST DOSE OF XARELTO IS 11/18/17.  HE HAS A COLONOSCOPY ON Monday.

## 2017-11-19 NOTE — Progress Notes (Signed)
Left message for patient regarding PCR. Prescription for mupirocin called into [facility]

## 2017-11-21 ENCOUNTER — Encounter: Payer: Self-pay | Admitting: Internal Medicine

## 2017-11-21 ENCOUNTER — Other Ambulatory Visit: Payer: Self-pay

## 2017-11-21 ENCOUNTER — Ambulatory Visit (AMBULATORY_SURGERY_CENTER): Payer: BLUE CROSS/BLUE SHIELD | Admitting: Internal Medicine

## 2017-11-21 VITALS — BP 128/60 | HR 67 | Temp 99.1°F | Resp 12 | Ht 74.0 in | Wt 237.0 lb

## 2017-11-21 DIAGNOSIS — D120 Benign neoplasm of cecum: Secondary | ICD-10-CM

## 2017-11-21 DIAGNOSIS — Z1211 Encounter for screening for malignant neoplasm of colon: Secondary | ICD-10-CM | POA: Diagnosis not present

## 2017-11-21 MED ORDER — SODIUM CHLORIDE 0.9 % IV SOLN: 500.0000 mL | Freq: Once | INTRAVENOUS | Status: DC

## 2017-11-21 NOTE — Patient Instructions (Signed)
YOU HAD AN ENDOSCOPIC PROCEDURE TODAY AT [facility]:   Refer to the procedure report that was given to you for any specific questions about what was found during the examination.  If the procedure report does not answer your questions, please call your gastroenterologist to clarify.  If you requested that your care partner not be given the details of your procedure findings, then the procedure report has been included in a sealed envelope for you to review at your convenience later.  YOU SHOULD EXPECT: Some feelings of bloating in the abdomen. Passage of more gas than usual.  Walking can help get rid of the air that was put into your GI tract during the procedure and reduce the bloating. If you had a lower endoscopy (such as a colonoscopy or flexible sigmoidoscopy) you may notice spotting of blood in your stool or on the toilet paper. If you underwent a bowel prep for your procedure, you may not have a normal bowel movement for a few days.  Please Note:  You might notice some irritation and congestion in your nose or some drainage.  This is from the oxygen used during your procedure.  There is no need for concern and it should clear up in a day or so.  SYMPTOMS TO REPORT IMMEDIATELY:   Following lower endoscopy (colonoscopy or flexible sigmoidoscopy):  Excessive amounts of blood in the stool  Significant tenderness or worsening of abdominal pains  Swelling of the abdomen that is new, acute  Fever of 100F or higher  Please see handouts given to you on Diverticulosis, polyps and Hemorrhoids.  For urgent or emergent issues, a gastroenterologist can be reached at any hour by calling [phone].   DIET:  We do recommend a small meal at first, but then you may proceed to your regular diet.  Drink plenty of fluids but you should avoid alcoholic beverages for 24 hours.  ACTIVITY:  You should plan to take it easy for the rest of today and you should NOT DRIVE or use heavy  machinery until tomorrow (because of the sedation medicines used during the test).    FOLLOW UP: Our staff will call the number listed on your records the next business day following your procedure to check on you and address any questions or concerns that you may have regarding the information given to you following your procedure. If we do not reach you, we will leave a message.  However, if you are feeling well and you are not experiencing any problems, there is no need to return our call.  We will assume that you have returned to your regular daily activities without incident.  If any biopsies were taken you will be contacted by phone or by letter within the next 1-3 weeks.  Please call us at [phone] if you have not heard about the biopsies in 3 weeks.    SIGNATURES/CONFIDENTIALITY: You and/or your care partner have signed paperwork which will be entered into your electronic medical record.  These signatures attest to the fact that that the information above on your After Visit Summary has been reviewed and is understood.  Full responsibility of the confidentiality of this discharge information lies with you and/or your care-partner.   Thank you for letting us take care of your healthcare needs today.

## 2017-11-21 NOTE — Op Note (Signed)
[facility] Patient Name: Alex [name] Procedure Date: 11/21/2017 8:50 AM MRN: [id number] Endoscopist: Docia Chuck. Henrene Pastor , MD Age: 67 Referring MD:  Date of Birth: 02/22/1951 Gender: Male Account #: [id number] Procedure:                Colonoscopy, With cold snare polypectomy x 1 Indications:              Screening for colorectal malignant neoplasm Medicines:                Monitored Anesthesia Care Procedure:                Pre-Anesthesia Assessment:                           - Prior to the procedure, a History and Physical                            was performed, and patient medications and                            allergies were reviewed. The patient's tolerance of                            previous anesthesia was also reviewed. The risks                            and benefits of the procedure and the sedation                            options and risks were discussed with the patient.                            All questions were answered, and informed consent                            was obtained. Prior Anticoagulants: The patient has                            taken Xarelto (rivaroxaban), last dose was 3 days                            prior to procedure. ASA Grade Assessment: III - A                            patient with severe systemic disease. After                            reviewing the risks and benefits, the patient was                            deemed in satisfactory condition to undergo the                            procedure.  After obtaining informed consent, the colonoscope                            was passed under direct vision. Throughout the                            procedure, the patient's blood pressure, pulse, and                            oxygen saturations were monitored continuously. The                            Colonoscope was introduced through the anus and                            advanced to the the  cecum, identified by                            appendiceal orifice and ileocecal valve. The                            ileocecal valve, appendiceal orifice, and rectum                            were photographed. The quality of the bowel                            preparation was good. The colonoscopy was performed                            without difficulty. The patient tolerated the                            procedure well. The bowel preparation used was                            SUPREP. Scope In: 8:59:25 AM Scope Out: 9:22:37 AM Scope Withdrawal Time: 0 hours 18 minutes 40 seconds  Total Procedure Duration: 0 hours 23 minutes 12 seconds  Findings:                 A 3 mm polyp was found in the cecum. The polyp was                            removed with a cold snare. Resection and retrieval                            were complete.                           Multiple diverticula were found in the sigmoid                            colon and ascending colon.  Internal hemorrhoids were found during retroflexion.                           The exam was otherwise without abnormality on                            direct and retroflexion views. Complications:            No immediate complications. Estimated blood loss:                            None. Estimated Blood Loss:     Estimated blood loss: none. Impression:               - One 3 mm polyp in the cecum, removed with a cold                            snare. Resected and retrieved.                           - Diverticulosis in the sigmoid colon and in the                            ascending colon.                           - Internal hemorrhoids.                           - The examination was otherwise normal on direct                            and retroflexion views. Recommendation:           - Repeat colonoscopy in 5-10 years for surveillance.                           - Resume Xarelto (rivaroxaban)  today at prior dose.                           - Patient has a contact number available for                            emergencies. The signs and symptoms of potential                            delayed complications were discussed with the                            patient. Return to normal activities tomorrow.                            Written discharge instructions were provided to the                            patient.                           -  Resume previous diet.                           - Continue present medications.                           - Await pathology results. Docia Chuck. Henrene Pastor, MD 11/21/2017 9:27:28 AM This report has been signed electronically.

## 2017-11-21 NOTE — Progress Notes (Signed)
To recovery, report to RN, VSS. 

## 2017-11-21 NOTE — Progress Notes (Signed)
Called to room to assist during endoscopic procedure.  Patient ID and intended procedure confirmed with present staff. Received instructions for my participation in the procedure from the performing physician.  

## 2017-11-22 ENCOUNTER — Telehealth: Payer: Self-pay

## 2017-11-22 ENCOUNTER — Telehealth: Payer: Self-pay | Admitting: *Deleted

## 2017-11-22 NOTE — Telephone Encounter (Signed)
  Follow up Call-  Call back number 11/21/2017  Post procedure Call Back phone  # [phone]  Permission to leave phone message Yes  Some recent data might be hidden     Patient questions:  Do you have a fever, pain , or abdominal swelling? No. Pain Score  0 *  Have you tolerated food without any problems? Yes.    Have you been able to return to your normal activities? Yes.    Do you have any questions about your discharge instructions: Diet   No. Medications  No. Follow up visit  No.  Do you have questions or concerns about your Care? No.  Actions: * If pain score is 4 or above: No action needed, pain <4.

## 2017-11-22 NOTE — Telephone Encounter (Signed)
Called [phone] and left a messaged we tried to reach pt for a follow up call. maw

## 2017-11-24 ENCOUNTER — Encounter: Payer: Self-pay | Admitting: Internal Medicine

## 2017-11-27 ENCOUNTER — Encounter (HOSPITAL_COMMUNITY): Payer: Self-pay | Admitting: Anesthesiology

## 2017-11-27 NOTE — Anesthesia Preprocedure Evaluation (Addendum)
Anesthesia Evaluation  Patient identified by MRN, date of birth, ID band Patient awake    Reviewed: Allergy & Precautions, NPO status , Patient's Chart, lab work & pertinent test results  Airway Mallampati: I       Dental  (+) Poor Dentition   Pulmonary neg pulmonary ROS, former smoker,    Pulmonary exam normal breath sounds clear to auscultation       Cardiovascular hypertension, Pt. on medications Normal cardiovascular exam Rhythm:Regular Rate:Normal     Neuro/Psych negative psych ROS   GI/Hepatic negative GI ROS, Neg liver ROS,   Endo/Other  negative endocrine ROS  Renal/GU   negative genitourinary   Musculoskeletal   Abdominal Normal abdominal exam  (+)   Peds  Hematology negative hematology ROS (+)   Anesthesia Other Findings   Reproductive/Obstetrics                            Anesthesia Physical Anesthesia Plan  ASA: II  Anesthesia Plan: General   Post-op Pain Management:    Induction: Intravenous  PONV Risk Score and Plan: 3  Airway Management Planned: Oral ETT  Additional Equipment:   Intra-op Plan:   Post-operative Plan: Extubation in OR  Informed Consent: I have reviewed the patients History and Physical, chart, labs and discussed the procedure including the risks, benefits and alternatives for the proposed anesthesia with the patient or authorized representative who has indicated his/her understanding and acceptance.   Dental advisory given  Plan Discussed with: CRNA and Surgeon  Anesthesia Plan Comments:        Anesthesia Quick Evaluation

## 2017-11-28 ENCOUNTER — Ambulatory Visit (HOSPITAL_COMMUNITY): Payer: BLUE CROSS/BLUE SHIELD | Admitting: Anesthesiology

## 2017-11-28 ENCOUNTER — Ambulatory Visit (HOSPITAL_COMMUNITY): Payer: BLUE CROSS/BLUE SHIELD

## 2017-11-28 ENCOUNTER — Ambulatory Visit (HOSPITAL_COMMUNITY)
Admission: RE | Disposition: A | Discharge status: Home / Self Care | Payer: Self-pay | Source: Ambulatory Visit | Attending: Neurosurgery

## 2017-11-28 ENCOUNTER — Encounter (HOSPITAL_COMMUNITY): Payer: Self-pay

## 2017-11-28 ENCOUNTER — Observation Stay (HOSPITAL_COMMUNITY)
Admission: RE | Admit: 2017-11-28 | Discharge: 2017-11-29 | Disposition: A | Discharge status: Home / Self Care | Payer: BLUE CROSS/BLUE SHIELD | Source: Ambulatory Visit | Attending: Neurosurgery | Admitting: Neurosurgery

## 2017-11-28 DIAGNOSIS — Z981 Arthrodesis status: Secondary | ICD-10-CM | POA: Insufficient documentation

## 2017-11-28 DIAGNOSIS — G4489 Other headache syndrome: Secondary | ICD-10-CM | POA: Diagnosis not present

## 2017-11-28 DIAGNOSIS — Z86711 Personal history of pulmonary embolism: Secondary | ICD-10-CM | POA: Insufficient documentation

## 2017-11-28 DIAGNOSIS — M5011 Cervical disc disorder with radiculopathy,  high cervical region: Principal | ICD-10-CM | POA: Insufficient documentation

## 2017-11-28 DIAGNOSIS — E7800 Pure hypercholesterolemia, unspecified: Secondary | ICD-10-CM | POA: Insufficient documentation

## 2017-11-28 DIAGNOSIS — Z87891 Personal history of nicotine dependence: Secondary | ICD-10-CM | POA: Diagnosis not present

## 2017-11-28 DIAGNOSIS — Z419 Encounter for procedure for purposes other than remedying health state, unspecified: Secondary | ICD-10-CM

## 2017-11-28 DIAGNOSIS — Z7901 Long term (current) use of anticoagulants: Secondary | ICD-10-CM | POA: Diagnosis not present

## 2017-11-28 DIAGNOSIS — Z803 Family history of malignant neoplasm of breast: Secondary | ICD-10-CM | POA: Diagnosis not present

## 2017-11-28 DIAGNOSIS — M5020 Other cervical disc displacement, unspecified cervical region: Secondary | ICD-10-CM | POA: Diagnosis present

## 2017-11-28 DIAGNOSIS — Z79899 Other long term (current) drug therapy: Secondary | ICD-10-CM | POA: Insufficient documentation

## 2017-11-28 DIAGNOSIS — L03116 Cellulitis of left lower limb: Secondary | ICD-10-CM | POA: Diagnosis not present

## 2017-11-28 DIAGNOSIS — M5412 Radiculopathy, cervical region: Secondary | ICD-10-CM | POA: Diagnosis not present

## 2017-11-28 DIAGNOSIS — Z881 Allergy status to other antibiotic agents status: Secondary | ICD-10-CM | POA: Insufficient documentation

## 2017-11-28 DIAGNOSIS — I10 Essential (primary) hypertension: Secondary | ICD-10-CM | POA: Diagnosis not present

## 2017-11-28 DIAGNOSIS — M4322 Fusion of spine, cervical region: Secondary | ICD-10-CM | POA: Diagnosis not present

## 2017-11-28 DIAGNOSIS — M5032 Other cervical disc degeneration, mid-cervical region, unspecified level: Secondary | ICD-10-CM | POA: Diagnosis not present

## 2017-11-28 HISTORY — PX: ANTERIOR CERVICAL DECOMP/DISCECTOMY FUSION: SHX1161

## 2017-11-28 LAB — PROTIME-INR
INR: 1.06
Prothrombin Time: 13.8 seconds (ref 11.4–15.2)

## 2017-11-28 SURGERY — ANTERIOR CERVICAL DECOMPRESSION/DISCECTOMY FUSION 1 LEVEL
Anesthesia: General

## 2017-11-28 MED ORDER — FENTANYL CITRATE (PF) 100 MCG/2ML IJ SOLN
INTRAMUSCULAR | Status: DC | PRN
  Administered 2017-11-28: 100 ug via INTRAVENOUS
  Administered 2017-11-28 (×6): 50 ug via INTRAVENOUS

## 2017-11-28 MED ORDER — ONDANSETRON HCL 4 MG/2ML IJ SOLN
INTRAMUSCULAR | Status: AC
  Filled 2017-11-28: qty 2

## 2017-11-28 MED ORDER — SODIUM BICARBONATE 8.4 % IV SOLN
INTRAVENOUS | Status: AC
  Filled 2017-11-28: qty 50

## 2017-11-28 MED ORDER — BUPIVACAINE HCL (PF) 0.5 % IJ SOLN
INTRAMUSCULAR | Status: DC | PRN
  Administered 2017-11-28: 15 mL

## 2017-11-28 MED ORDER — LIDOCAINE-EPINEPHRINE 1 %-1:100000 IJ SOLN
INTRAMUSCULAR | Status: AC
  Filled 2017-11-28: qty 1

## 2017-11-28 MED ORDER — MIDAZOLAM HCL 2 MG/2ML IJ SOLN
INTRAMUSCULAR | Status: AC
  Filled 2017-11-28: qty 2

## 2017-11-28 MED ORDER — SODIUM CHLORIDE 0.9% FLUSH: 3.0000 mL | INTRAVENOUS | Status: DC | PRN

## 2017-11-28 MED ORDER — CYCLOBENZAPRINE HCL 5 MG PO TABS: 5.0000 mg | ORAL_TABLET | Freq: Three times a day (TID) | ORAL | Status: DC | PRN

## 2017-11-28 MED ORDER — LACTATED RINGERS IV SOLN
INTRAVENOUS | Status: DC | PRN
  Administered 2017-11-28: 07:00:00 via INTRAVENOUS

## 2017-11-28 MED ORDER — SODIUM CHLORIDE 0.9 % IV SOLN: 250.0000 mL | INTRAVENOUS | Status: DC

## 2017-11-28 MED ORDER — FLEET ENEMA 7-19 GM/118ML RE ENEM: 1.0000 | ENEMA | Freq: Once | RECTAL | Status: DC | PRN

## 2017-11-28 MED ORDER — ACETAMINOPHEN 10 MG/ML IV SOLN
INTRAVENOUS | Status: AC
  Filled 2017-11-28: qty 100

## 2017-11-28 MED ORDER — CHLORHEXIDINE GLUCONATE CLOTH 2 % EX PADS: 6.0000 | MEDICATED_PAD | Freq: Once | CUTANEOUS | Status: DC

## 2017-11-28 MED ORDER — MORPHINE SULFATE (PF) 4 MG/ML IV SOLN: 4.0000 mg | INTRAVENOUS | Status: DC | PRN

## 2017-11-28 MED ORDER — LIDOCAINE-EPINEPHRINE 1 %-1:100000 IJ SOLN
INTRAMUSCULAR | Status: DC | PRN
  Administered 2017-11-28: 15 mL

## 2017-11-28 MED ORDER — ACETAMINOPHEN 650 MG RE SUPP: 650.0000 mg | RECTAL | Status: DC | PRN

## 2017-11-28 MED ORDER — CEFAZOLIN SODIUM-DEXTROSE 2-4 GM/100ML-% IV SOLN
2.0000 g | INTRAVENOUS | Status: DC
  Filled 2017-11-28: qty 100

## 2017-11-28 MED ORDER — ONDANSETRON HCL 4 MG/2ML IJ SOLN
INTRAMUSCULAR | Status: DC | PRN
  Administered 2017-11-28: 4 mg via INTRAVENOUS

## 2017-11-28 MED ORDER — ACETAMINOPHEN 500 MG PO TABS: 1000.0000 mg | ORAL_TABLET | Freq: Four times a day (QID) | ORAL | Status: DC | PRN

## 2017-11-28 MED ORDER — PHENOL 1.4 % MT LIQD: 1.0000 | OROMUCOSAL | Status: DC | PRN

## 2017-11-28 MED ORDER — MENTHOL 3 MG MT LOZG: 1.0000 | LOZENGE | OROMUCOSAL | Status: DC | PRN

## 2017-11-28 MED ORDER — DEXTROSE 5 % IV SOLN
INTRAVENOUS | Status: DC | PRN
  Administered 2017-11-28: 20 ug/min via INTRAVENOUS

## 2017-11-28 MED ORDER — LIDOCAINE HCL (CARDIAC) 20 MG/ML IV SOLN
INTRAVENOUS | Status: DC | PRN
  Administered 2017-11-28: 100 mg via INTRAVENOUS

## 2017-11-28 MED ORDER — 0.9 % SODIUM CHLORIDE (POUR BTL) OPTIME
TOPICAL | Status: DC | PRN
  Administered 2017-11-28: 1000 mL

## 2017-11-28 MED ORDER — SUCCINYLCHOLINE CHLORIDE 200 MG/10ML IV SOSY
PREFILLED_SYRINGE | INTRAVENOUS | Status: AC
  Filled 2017-11-28: qty 10

## 2017-11-28 MED ORDER — FENTANYL CITRATE (PF) 250 MCG/5ML IJ SOLN
INTRAMUSCULAR | Status: AC
  Filled 2017-11-28: qty 5

## 2017-11-28 MED ORDER — HEMOSTATIC AGENTS (NO CHARGE) OPTIME
TOPICAL | Status: DC | PRN
  Administered 2017-11-28: 1 via TOPICAL

## 2017-11-28 MED ORDER — SODIUM CHLORIDE 0.9% FLUSH: 3.0000 mL | Freq: Two times a day (BID) | INTRAVENOUS | Status: DC

## 2017-11-28 MED ORDER — EPHEDRINE SULFATE 50 MG/ML IJ SOLN
INTRAMUSCULAR | Status: DC | PRN
  Administered 2017-11-28: 10 mg via INTRAVENOUS
  Administered 2017-11-28: 5 mg via INTRAVENOUS

## 2017-11-28 MED ORDER — PHENYLEPHRINE HCL 10 MG/ML IJ SOLN
INTRAMUSCULAR | Status: DC | PRN
  Administered 2017-11-28 (×3): 120 ug via INTRAVENOUS

## 2017-11-28 MED ORDER — BUPIVACAINE HCL (PF) 0.5 % IJ SOLN
INTRAMUSCULAR | Status: AC
  Filled 2017-11-28: qty 30

## 2017-11-28 MED ORDER — DEXAMETHASONE SODIUM PHOSPHATE 10 MG/ML IJ SOLN
INTRAMUSCULAR | Status: AC
  Filled 2017-11-28: qty 1

## 2017-11-28 MED ORDER — ACETAMINOPHEN 325 MG PO TABS: 650.0000 mg | ORAL_TABLET | ORAL | Status: DC | PRN

## 2017-11-28 MED ORDER — PROPOFOL 10 MG/ML IV BOLUS
INTRAVENOUS | Status: DC | PRN
  Administered 2017-11-28: 200 mg via INTRAVENOUS

## 2017-11-28 MED ORDER — GELATIN ABSORBABLE MT POWD
OROMUCOSAL | Status: DC | PRN
  Administered 2017-11-28: 07:00:00 via TOPICAL

## 2017-11-28 MED ORDER — SUGAMMADEX SODIUM 200 MG/2ML IV SOLN
INTRAVENOUS | Status: DC | PRN
  Administered 2017-11-28: 200 mg via INTRAVENOUS

## 2017-11-28 MED ORDER — KETOROLAC TROMETHAMINE 30 MG/ML IJ SOLN: 30.0000 mg | Freq: Once | INTRAMUSCULAR | Status: DC

## 2017-11-28 MED ORDER — EPHEDRINE 5 MG/ML INJ
INTRAVENOUS | Status: AC
  Filled 2017-11-28: qty 10

## 2017-11-28 MED ORDER — THROMBIN 5000 UNITS EX SOLR
CUTANEOUS | Status: AC
  Filled 2017-11-28: qty 15000

## 2017-11-28 MED ORDER — ACETAMINOPHEN 10 MG/ML IV SOLN
INTRAVENOUS | Status: DC | PRN
  Administered 2017-11-28: 1000 mg via INTRAVENOUS

## 2017-11-28 MED ORDER — HYDROXYZINE HCL 25 MG PO TABS: 50.0000 mg | ORAL_TABLET | ORAL | Status: DC | PRN

## 2017-11-28 MED ORDER — MEPERIDINE HCL 50 MG/ML IJ SOLN: 6.2500 mg | INTRAMUSCULAR | Status: DC | PRN

## 2017-11-28 MED ORDER — BISACODYL 10 MG RE SUPP: 10.0000 mg | Freq: Every day | RECTAL | Status: DC | PRN

## 2017-11-28 MED ORDER — PHENYLEPHRINE 40 MCG/ML (10ML) SYRINGE FOR IV PUSH (FOR BLOOD PRESSURE SUPPORT)
PREFILLED_SYRINGE | INTRAVENOUS | Status: AC
  Filled 2017-11-28: qty 10

## 2017-11-28 MED ORDER — MAGNESIUM HYDROXIDE 400 MG/5ML PO SUSP: 30.0000 mL | Freq: Every day | ORAL | Status: DC | PRN

## 2017-11-28 MED ORDER — LISINOPRIL 5 MG PO TABS
5.0000 mg | ORAL_TABLET | Freq: Every day | ORAL | Status: DC
  Administered 2017-11-28: 5 mg via ORAL
  Filled 2017-11-28 (×2): qty 1

## 2017-11-28 MED ORDER — HYDROMORPHONE HCL 1 MG/ML IJ SOLN: 0.2500 mg | INTRAMUSCULAR | Status: DC | PRN

## 2017-11-28 MED ORDER — KETOROLAC TROMETHAMINE 30 MG/ML IJ SOLN
30.0000 mg | Freq: Four times a day (QID) | INTRAMUSCULAR | Status: DC
  Administered 2017-11-28 – 2017-11-29 (×5): 30 mg via INTRAVENOUS
  Filled 2017-11-28 (×5): qty 1

## 2017-11-28 MED ORDER — SODIUM CHLORIDE 0.9 % IR SOLN
Status: DC | PRN
  Administered 2017-11-28: 07:00:00

## 2017-11-28 MED ORDER — HYDROCODONE-ACETAMINOPHEN 5-325 MG PO TABS: 1.0000 | ORAL_TABLET | ORAL | Status: DC | PRN

## 2017-11-28 MED ORDER — KETOROLAC TROMETHAMINE 30 MG/ML IJ SOLN
INTRAMUSCULAR | Status: AC
  Filled 2017-11-28: qty 1

## 2017-11-28 MED ORDER — HYDROXYZINE HCL 50 MG/ML IM SOLN: 50.0000 mg | INTRAMUSCULAR | Status: DC | PRN

## 2017-11-28 MED ORDER — PROMETHAZINE HCL 25 MG/ML IJ SOLN: 6.2500 mg | INTRAMUSCULAR | Status: DC | PRN

## 2017-11-28 MED ORDER — MIDAZOLAM HCL 5 MG/5ML IJ SOLN
INTRAMUSCULAR | Status: DC | PRN
  Administered 2017-11-28: 2 mg via INTRAVENOUS

## 2017-11-28 MED ORDER — ROCURONIUM BROMIDE 100 MG/10ML IV SOLN
INTRAVENOUS | Status: DC | PRN
  Administered 2017-11-28: 50 mg via INTRAVENOUS

## 2017-11-28 MED ORDER — KCL IN DEXTROSE-NACL 20-5-0.45 MEQ/L-%-% IV SOLN: INTRAVENOUS | Status: DC

## 2017-11-28 MED ORDER — DEXAMETHASONE SODIUM PHOSPHATE 10 MG/ML IJ SOLN
INTRAMUSCULAR | Status: DC | PRN
  Administered 2017-11-28: 10 mg via INTRAVENOUS

## 2017-11-28 MED ORDER — KETOROLAC TROMETHAMINE 30 MG/ML IJ SOLN
30.0000 mg | Freq: Once | INTRAMUSCULAR | Status: AC | PRN
  Administered 2017-11-28: 30 mg via INTRAVENOUS

## 2017-11-28 MED ORDER — SUGAMMADEX SODIUM 200 MG/2ML IV SOLN
INTRAVENOUS | Status: AC
  Filled 2017-11-28: qty 2

## 2017-11-28 MED ORDER — ALUM & MAG HYDROXIDE-SIMETH 200-200-20 MG/5ML PO SUSP: 30.0000 mL | Freq: Four times a day (QID) | ORAL | Status: DC | PRN

## 2017-11-28 MED ORDER — ROCURONIUM BROMIDE 10 MG/ML (PF) SYRINGE
PREFILLED_SYRINGE | INTRAVENOUS | Status: AC
  Filled 2017-11-28: qty 5

## 2017-11-28 MED ORDER — PROPOFOL 10 MG/ML IV BOLUS
INTRAVENOUS | Status: AC
  Filled 2017-11-28: qty 20

## 2017-11-28 SURGICAL SUPPLY — 56 items
BAG DECANTER FOR FLEXI CONT (MISCELLANEOUS) ×2 IMPLANT
BIT DRILL 14X2.5XNS TI ANT (BIT) ×1 IMPLANT
BIT DRILL AVIATOR 14 (BIT) ×1
BIT DRILL NEURO 2X3.1 SFT TUCH (MISCELLANEOUS) ×1 IMPLANT
BIT DRL 14X2.5XNS TI ANT (BIT) ×1
BLADE ULTRA TIP 2M (BLADE) ×2 IMPLANT
CANISTER SUCT 3000ML PPV (MISCELLANEOUS) ×2 IMPLANT
CARTRIDGE OIL MAESTRO DRILL (MISCELLANEOUS) ×1 IMPLANT
COVER MAYO STAND STRL (DRAPES) ×2 IMPLANT
DECANTER SPIKE VIAL GLASS SM (MISCELLANEOUS) ×2 IMPLANT
DERMABOND ADVANCED (GAUZE/BANDAGES/DRESSINGS) ×1
DERMABOND ADVANCED .7 DNX12 (GAUZE/BANDAGES/DRESSINGS) ×1 IMPLANT
DIFFUSER DRILL AIR PNEUMATIC (MISCELLANEOUS) ×2 IMPLANT
DRAPE HALF SHEET 40X57 (DRAPES) ×2 IMPLANT
DRAPE LAPAROTOMY 100X72 PEDS (DRAPES) ×2 IMPLANT
DRAPE MICROSCOPE LEICA (MISCELLANEOUS) ×2 IMPLANT
DRAPE POUCH INSTRU U-SHP 10X18 (DRAPES) ×2 IMPLANT
DRILL NEURO 2X3.1 SOFT TOUCH (MISCELLANEOUS) ×2
ELECT COATED BLADE 2.86 ST (ELECTRODE) ×2 IMPLANT
ELECT REM PT RETURN 9FT ADLT (ELECTROSURGICAL) ×2
ELECTRODE REM PT RTRN 9FT ADLT (ELECTROSURGICAL) ×1 IMPLANT
GLOVE BIOGEL PI IND STRL 8 (GLOVE) ×2 IMPLANT
GLOVE BIOGEL PI IND STRL 8.5 (GLOVE) ×1 IMPLANT
GLOVE BIOGEL PI INDICATOR 8 (GLOVE) ×2
GLOVE BIOGEL PI INDICATOR 8.5 (GLOVE) ×1
GLOVE ECLIPSE 7.5 STRL STRAW (GLOVE) ×4 IMPLANT
GLOVE EXAM NITRILE LRG STRL (GLOVE) IMPLANT
GLOVE EXAM NITRILE XL STR (GLOVE) IMPLANT
GLOVE EXAM NITRILE XS STR PU (GLOVE) IMPLANT
GOWN STRL REUS W/ TWL LRG LVL3 (GOWN DISPOSABLE) IMPLANT
GOWN STRL REUS W/ TWL XL LVL3 (GOWN DISPOSABLE) ×2 IMPLANT
GOWN STRL REUS W/TWL 2XL LVL3 (GOWN DISPOSABLE) ×2 IMPLANT
GOWN STRL REUS W/TWL LRG LVL3 (GOWN DISPOSABLE)
GOWN STRL REUS W/TWL XL LVL3 (GOWN DISPOSABLE) ×2
GRAFT CORT CANC 9X14X11MM (Bone Implant) ×2 IMPLANT
HALTER HD/CHIN CERV TRACTION D (MISCELLANEOUS) ×2 IMPLANT
HEMOSTAT POWDER KIT SURGIFOAM (HEMOSTASIS) ×2 IMPLANT
KIT BASIN OR (CUSTOM PROCEDURE TRAY) ×2 IMPLANT
KIT TURNOVER KIT B (KITS) ×2 IMPLANT
NEEDLE HYPO 25X1 1.5 SAFETY (NEEDLE) ×2 IMPLANT
NEEDLE SPNL 22GX3.5 QUINCKE BK (NEEDLE) ×2 IMPLANT
NS IRRIG 1000ML POUR BTL (IV SOLUTION) ×2 IMPLANT
OIL CARTRIDGE MAESTRO DRILL (MISCELLANEOUS) ×2
PACK LAMINECTOMY NEURO (CUSTOM PROCEDURE TRAY) ×2 IMPLANT
PAD ARMBOARD 7.5X6 YLW CONV (MISCELLANEOUS) ×6 IMPLANT
PLATE AVIATOR ASSY 1LVL SZ 16 (Plate) ×2 IMPLANT
RUBBERBAND STERILE (MISCELLANEOUS) ×4 IMPLANT
SCREW AVIATOR VAR SELFTAP 4X14 (Screw) ×8 IMPLANT
SPONGE INTESTINAL PEANUT (DISPOSABLE) ×2 IMPLANT
SPONGE SURGIFOAM ABS GEL SZ50 (HEMOSTASIS) ×2 IMPLANT
STAPLER SKIN PROX WIDE 3.9 (STAPLE) ×2 IMPLANT
SUT VIC AB 2-0 CP2 18 (SUTURE) ×2 IMPLANT
SUT VIC AB 3-0 SH 8-18 (SUTURE) ×2 IMPLANT
TOWEL GREEN STERILE (TOWEL DISPOSABLE) ×2 IMPLANT
TOWEL GREEN STERILE FF (TOWEL DISPOSABLE) IMPLANT
WATER STERILE IRR 1000ML POUR (IV SOLUTION) ×2 IMPLANT

## 2017-11-28 NOTE — Op Note (Signed)
11/28/2017  9:49 AM  PATIENT:  Alex [name]  67 y.o. 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  ASSISTANTS: Erline Levine, M.D.  ANESTHESIA:   general  EBL:  Total I/O In: -  Out: 50 [Blood:50]  BLOOD ADMINISTERED:none  COUNT: Correct per nursing staff  DICTATION: Patient was brought to the operating room placed under general endotracheal anesthesia. Patient was placed in 10 pounds of halter traction. The neck was prepped with Betadine soap and solution and draped in a sterile fashion. A horizontal incision was made on the left side of the neck. The line of the incision was infiltrated with local anesthetic with epinephrine. Dissection was carried down thru the subcutaneous tissue and platysma, bipolar cautery was used to maintain hemostasis. Dissection was then carried down thru an avascular plane leaving the sternocleidomastoid carotid artery and jugular vein laterally and the trachea and esophagus medially. The ventral aspect of the vertebral column was identified and a localizing x-ray was taken. The C3-4 level was identified. The annulus was incised and the disc space entered. Discectomy was performed with micro-curettes and pituitary rongeurs. The operating microscope was draped and brought into the field provided additional magnification illumination and visualization. Discectomy was continued posteriorly thru the disc space and then the cartilaginous endplate was removed using micro-curettes along with the high-speed drill. Posterior osteophytic overgrowth was removed using the high-speed drill along with a 2 mm thin  footplated Kerrison punch. Posterior longitudinal ligament along with disc herniation was carefully removed, decompressing the spinal canal and thecal sac. We then continued to remove osteophytic overgrowth and disc material decompressing the neural foramina and exiting nerve roots bilaterally. Once the decompression was completed hemostasis was established with the use of Gelfoam with thrombin and bipolar cautery. The Gelfoam was removed, a thin layer Surgifoam applied, the wound irrigated and hemostasis confirmed. We then measured the height of the intravertebral disc space and selected a 9 millimeter in height structural allograft. It was hydrated and saline solution and then gently positioned in the intravertebral disc space and countersunk. We then selected a 16 millimeter in height Aviator cervical plate. It was positioned over the fusion construct and secured to the vertebra with 4 x 14 screws at the C3 level, and 4 x 14 screws at the C4 level. Each screw hole was started with the high-speed drill and then the screws placed once all the screws were placed, the locking system was secured. The wound was irrigated with bacitracin solution checked for hemostasis which was established and confirmed. An x-ray was taken which showed the grafts in good position, the plate and screws in good position, and the overall alignment looked good. We then proceeded with closure. The platysma was closed with interrupted inverted 2-0 undyed Vicryl suture, the subcutaneous and subcuticular closed with interrupted inverted 3-0 undyed Vicryl suture. The skin edges were approximated with Dermabond. Following surgery the patient was taken out of cervical traction. To be reversed and the anesthetic and taken to the recovery room for further care.   PLAN OF CARE: Admit for overnight observation  PATIENT DISPOSITION:  PACU - hemodynamically stable.   Delay start of Pharmacological VTE agent (>24hrs) due to surgical blood loss or  risk of bleeding:  yes

## 2017-11-28 NOTE — Progress Notes (Signed)
Vitals:   11/28/17 1046 11/28/17 1101 11/28/17 1125 11/28/17 1513  BP:  124/70 133/69 118/67  Pulse: 86 81 80 81  Resp: 16  16 16   Temp:  98 F (36.7 C) 97.6 F (36.4 C) (!) 97.5 F (36.4 C)  TempSrc:   Oral Oral  SpO2: 96% 94% 95% 98%  Weight:        Patient up and ambulating in the halls. Comfortable. Voiding. Moving all 4 extremities well. Wound clean and dry; no erythema, swelling, or drainage.  Plan: Encouraged to ambulate at least a couple more times this evening. Continue to progress through postoperative recovery.  Hosie Spangle, MD 11/28/2017, 5:21 PM

## 2017-11-28 NOTE — H&P (Signed)
Subjective: Patient is a 67 y.o. 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  He has some minimal dysesthesias through the left upper extremity.  Patient is 2 years status post a 2 level C5-6 and C6-7 ADF that is healed well.  Other cervical levels showed minimal degeneration. He is admitted now for a C3-4 anterior cervical decompression and arthrodesis.   He does have a significant history of recurrent pulmonary embolism and has been treated with Jennye Moccasin. That was stopped over a week ago, the patient underwent an uneventful colonoscopy last week.  The plan is to resume the Jennye Moccasin once the risk of bleeding in the cervical surgical plane has diminished sufficiently.   Patient Active Problem List   Diagnosis Date Noted  . Personal history of DVT (deep vein thrombosis) 05/14/2017  . Hypertension 05/14/2017  . Cellulitis of left leg 05/14/2017   Past Medical History:  Diagnosis Date  . DVT (deep venous thrombosis) ([place])   . Headache   . Hypercholesterolemia   . Hypertension     Past Surgical History:  Procedure Laterality Date  . ANTERIOR FUSION CLIVUS-C2 EXTRAORAL W/ ODONTOID EXCISION    . SHOULDER ARTHROSCOPY    . URINARY SURGERY     YRS AGO    Facility-Administered Medications Prior to Admission  Medication Dose Route Frequency Provider Last Rate Last Dose  . 0.9 %  sodium chloride infusion  500 mL Intravenous Once Irene Shipper, MD       Medications Prior to Admission  Medication Sig Dispense Refill Last Dose  . lisinopril (PRINIVIL,ZESTRIL) 10 MG tablet Take 5 mg by mouth daily.    11/27/2017 at Unknown time  . rivaroxaban (XARELTO) 20 MG TABS tablet Take 20 mg by mouth daily.    Past Month at Unknown time   Allergies  Allergen Reactions  . Vancomycin Hives    Social History   Tobacco Use  . Smoking status: Former Research scientist (life sciences)  . Smokeless tobacco: Current User    Types: Snuff  . Tobacco comment:  quit 30 yrs ago  Substance Use Topics  . Alcohol use: Yes    Comment: occassional    Family History  Problem Relation Age of Onset  . Breast cancer Mother   . Colon cancer Neg Hx   . Rectal cancer Neg Hx   . Stomach cancer Neg Hx   . Esophageal cancer Neg Hx      Review of Systems A comprehensive review of systems was negative.  Objective: Vital signs in last 24 hours: Temp:  [98.5 F (36.9 C)] 98.5 F (36.9 C) (04/01 0611) Pulse Rate:  [71] 71 (04/01 0611) Resp:  [16] 16 (04/01 0611) BP: (131)/(73) 131/73 (04/01 0611) SpO2:  [99 %] 99 % (04/01 0611) Weight:  [107.5 kg (237 lb)] 107.5 kg (237 lb) (04/01 6789)  EXAM:  Patient well-developed well-nourished white male in no acute distress.   Lungs are clear to auscultation , the patient has symmetrical respiratory excursion. Heart has a regular rate and rhythm normal S1 and S2 no murmur.   Abdomen is soft nontender nondistended bowel sounds are present. Extremity examination shows no clubbing cyanosis or edema. Motor examination shows 5 over 5 strength in the upper extremities including the deltoid biceps triceps and intrinsics and grip. Sensation is intact to pinprick throughout the digits of the upper extremities. Reflexes are symmetrical and without evidence of pathologic reflexes. Patient has a normal gait and stance.  Data Review:CBC    Component Value Date/Time   WBC 7.2 11/18/2017 1552   RBC 4.88 11/18/2017 1552   HGB 15.2 11/18/2017 1552   HCT 45.1 11/18/2017 1552   PLT 217 11/18/2017 1552   MCV 92.4 11/18/2017 1552   MCH 31.1 11/18/2017 1552   MCHC 33.7 11/18/2017 1552   RDW 13.5 11/18/2017 1552   LYMPHSABS 2.6 05/13/2017 2245   MONOABS 0.9 05/13/2017 2245   EOSABS 0.4 05/13/2017 2245   BASOSABS 0.0 05/13/2017 2245                          BMET    Component Value Date/Time   NA 137 11/18/2017 1552   K 3.9 11/18/2017 1552   CL 102 11/18/2017 1552   CO2 27 11/18/2017 1552   GLUCOSE 92 11/18/2017 1552    BUN 11 11/18/2017 1552   CREATININE 0.90 11/18/2017 1552   CALCIUM 8.9 11/18/2017 1552   GFRNONAA >60 11/18/2017 1552   GFRAA >60 11/18/2017 1552     Assessment/Plan: Patient with cervicalgia and cervicogenic headache has been found to have a 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  I've discussed with the patient the nature of his condition, the nature the surgical procedure, the typical length of surgery, hospital stay, and overall recuperation. We discussed limitations postoperatively. I discussed risks of surgery including risks of infection, bleeding, possibly need for transfusion, the risk of nerve root dysfunction with pain, weakness, numbness, or paresthesias, the risk of spinal cord dysfunction with paralysis of all 4 limbs and quadriplegia, and the risk of dural tear and CSF leakage and possible need for further surgery, the risk of esophageal dysfunction causing dysphagia and the risk of laryngeal dysfunction causing hoarseness of the voice, the risk of failure of the arthrodesis and the possible need for further surgery, and the risk of anesthetic complications including myocardial infarction, stroke, pneumonia, and death. We also discussed the need for postoperative immobilization in a cervical collar. Understanding all this the patient does wish to proceed with surgery and is admitted for such.    Hosie Spangle, MD 11/28/2017 7:22 AM

## 2017-11-28 NOTE — Transfer of Care (Signed)
Immediate Anesthesia Transfer of Care Note  Patient: Alex [name]  Procedure(s) Performed: ANTERIOR CERVICAL DECOMPRESSION/DISCECTOMY FUSION CERVICAL THREE- CERVICAL FOUR (N/A )  Patient Location: PACU  Anesthesia Type:General  Level of Consciousness: drowsy  Airway & Oxygen Therapy: Patient Spontanous Breathing and Patient connected to nasal cannula oxygen  Post-op Assessment: Report given to RN and Post -op Vital signs reviewed and stable  Post vital signs: Reviewed and stable  Last Vitals:  Vitals Value Taken Time  BP 120/66 11/28/2017 10:01 AM  Temp 36.5 C 11/28/2017 10:00 AM  Pulse 81 11/28/2017 10:08 AM  Resp 19 11/28/2017 10:08 AM  SpO2 91 % 11/28/2017 10:08 AM  Vitals shown include unvalidated device data.  Last Pain:  Vitals:   11/28/17 1000  TempSrc:   PainSc: Asleep      Patients Stated Pain Goal: 3 (22/44/97 5300)  Complications: No apparent anesthesia complications

## 2017-11-28 NOTE — Anesthesia Procedure Notes (Signed)
Procedure Name: Intubation Date/Time: 11/28/2017 7:42 AM Performed by: Shirlyn Goltz, CRNA Pre-anesthesia Checklist: Patient identified, Emergency Drugs available, Suction available and Patient being monitored Patient Re-evaluated:Patient Re-evaluated prior to induction Oxygen Delivery Method: Circle system utilized Preoxygenation: Pre-oxygenation with 100% oxygen Induction Type: IV induction Ventilation: Mask ventilation without difficulty Laryngoscope Size: Glidescope and 4 Grade View: Grade I Tube type: Oral Tube size: 7.5 mm Number of attempts: 1 Airway Equipment and Method: Stylet Placement Confirmation: ETT inserted through vocal cords under direct vision,  positive ETCO2 and breath sounds checked- equal and bilateral Secured at: 24 cm Tube secured with: Tape Dental Injury: Teeth and Oropharynx as per pre-operative assessment

## 2017-11-29 ENCOUNTER — Encounter (HOSPITAL_COMMUNITY): Payer: Self-pay | Admitting: Neurosurgery

## 2017-11-29 DIAGNOSIS — Z981 Arthrodesis status: Secondary | ICD-10-CM | POA: Diagnosis not present

## 2017-11-29 DIAGNOSIS — Z7901 Long term (current) use of anticoagulants: Secondary | ICD-10-CM | POA: Diagnosis not present

## 2017-11-29 DIAGNOSIS — Z86711 Personal history of pulmonary embolism: Secondary | ICD-10-CM | POA: Diagnosis not present

## 2017-11-29 DIAGNOSIS — G4489 Other headache syndrome: Secondary | ICD-10-CM | POA: Diagnosis not present

## 2017-11-29 DIAGNOSIS — I10 Essential (primary) hypertension: Secondary | ICD-10-CM | POA: Diagnosis not present

## 2017-11-29 DIAGNOSIS — Z803 Family history of malignant neoplasm of breast: Secondary | ICD-10-CM | POA: Diagnosis not present

## 2017-11-29 DIAGNOSIS — Z881 Allergy status to other antibiotic agents status: Secondary | ICD-10-CM | POA: Diagnosis not present

## 2017-11-29 DIAGNOSIS — Z87891 Personal history of nicotine dependence: Secondary | ICD-10-CM | POA: Diagnosis not present

## 2017-11-29 DIAGNOSIS — Z79899 Other long term (current) drug therapy: Secondary | ICD-10-CM | POA: Diagnosis not present

## 2017-11-29 DIAGNOSIS — M5011 Cervical disc disorder with radiculopathy,  high cervical region: Secondary | ICD-10-CM | POA: Diagnosis not present

## 2017-11-29 DIAGNOSIS — E7800 Pure hypercholesterolemia, unspecified: Secondary | ICD-10-CM | POA: Diagnosis not present

## 2017-11-29 MED ORDER — HYDROCODONE-ACETAMINOPHEN 5-325 MG PO TABS: 1.0000 | ORAL_TABLET | ORAL | 0 refills | Status: DC | PRN

## 2017-11-29 MED FILL — Thrombin For Soln 5000 Unit: CUTANEOUS | Qty: 5000 | Status: AC

## 2017-11-29 NOTE — Progress Notes (Signed)
Patient alert and oriented, mae's well, voiding adequate amount of urine, swallowing without difficulty, no c/o pain at time of discharge. Patient discharged home with family. Script and discharged instructions given to patient. Patient and family stated understanding of instructions given. Patient has an appointment with Dr. Nudelman 

## 2017-11-29 NOTE — Discharge Summary (Signed)
Physician Discharge Summary  Patient ID: Alex [name] MRN: [id number] DOB/AGE: 66-12-1950 66 y.o.  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 He is done well following surgery. He is up and ambulate actively. His incision is healing nicely, it is clean and dry; there is no swelling, erythema, or drainage. He does have mild hoarseness. He has been instructed to eat a soft diet for the next week or 2. He has been given instructions regarding wound care and activities. I've asked that he return for follow-up visit with me in 1 week. At that visit we will give him further instructions regarding when to resume his Xaralto.  Discharge Exam: Blood pressure 105/61, pulse 74, temperature 98.5 F (36.9 C), temperature source Oral, resp. rate 18, weight 107.5 kg (237 lb), SpO2 99 %.  Disposition: Discharge disposition: 01-Home or Self Care       Discharge Instructions    Discharge wound care:   Complete by:  As directed    Leave the wound open to air. Shower daily with the wound uncovered. Water and soapy water should run over the incision area. Do not wash directly on the incision for 2 weeks. Remove the glue after 2 weeks.   Driving Restrictions   Complete by:  As directed    No driving for 2 weeks. May ride in the car locally now. May begin to drive locally in 2 weeks.   Other Restrictions   Complete by:  As directed    Walk gradually increasing distances out in the fresh air at least twice a day. Walking additional 6 times inside the house, gradually increasing distances, daily. No bending, lifting, or  twisting. Perform activities between shoulder and waist height (that is at counter height when standing or table height when sitting).     Allergies as of 11/29/2017      Reactions   Vancomycin Hives      Medication List    TAKE these medications   HYDROcodone-acetaminophen 5-325 MG tablet Commonly known as:  NORCO/VICODIN Take 1-2 tablets by mouth every 4 (four) hours as needed (pain).   lisinopril 10 MG tablet Commonly known as:  PRINIVIL,ZESTRIL Take 5 mg by mouth daily.   XARELTO 20 MG Tabs tablet Generic drug:  rivaroxaban Take 20 mg by mouth daily.            Discharge Care Instructions  (From admission, onward)        Start     Ordered   11/29/17 0000  Discharge wound care:    Comments:  Leave the wound open to air. Shower daily with the wound uncovered. Water and soapy water should run over the incision area. Do not wash directly on the incision for 2 weeks. Remove the glue after 2 weeks.   11/29/17 0841       Signed: Hosie Spangle 11/29/2017, 8:52 AM

## 2017-11-29 NOTE — Anesthesia Postprocedure Evaluation (Signed)
Anesthesia Post Note  Patient: Alex [name]  Procedure(s) Performed: ANTERIOR CERVICAL DECOMPRESSION/DISCECTOMY FUSION CERVICAL THREE- CERVICAL FOUR (N/A )     Patient location during evaluation: PACU Anesthesia Type: General Level of consciousness: awake Pain management: pain level controlled Vital Signs Assessment: post-procedure vital signs reviewed and stable Respiratory status: spontaneous breathing Cardiovascular status: stable Postop Assessment: no apparent nausea or vomiting Anesthetic complications: no    Last Vitals:  Vitals:   11/29/17 1317 11/29/17 1608  BP: 120/78 127/72  Pulse: 73 75  Resp: 18 16  Temp: 36.9 C 37 C  SpO2: 100% 99%    Last Pain:  Vitals:   11/29/17 1643  TempSrc:   PainSc: 2    Pain Goal: Patients Stated Pain Goal: 3 (11/28/17 0641)               Mechille Varghese JR,JOHN [place] [facility]

## 2017-12-06 DIAGNOSIS — M5020 Other cervical disc displacement, unspecified cervical region: Secondary | ICD-10-CM | POA: Diagnosis not present

## 2017-12-14 MED FILL — Thrombin For Soln 5000 Unit: CUTANEOUS | Qty: 5000 | Status: AC

## 2017-12-20 DIAGNOSIS — Z981 Arthrodesis status: Secondary | ICD-10-CM | POA: Diagnosis not present

## 2017-12-20 DIAGNOSIS — I10 Essential (primary) hypertension: Secondary | ICD-10-CM | POA: Diagnosis not present

## 2017-12-20 DIAGNOSIS — Z125 Encounter for screening for malignant neoplasm of prostate: Secondary | ICD-10-CM | POA: Diagnosis not present

## 2017-12-20 DIAGNOSIS — Z23 Encounter for immunization: Secondary | ICD-10-CM | POA: Diagnosis not present

## 2018-01-09 DIAGNOSIS — Z981 Arthrodesis status: Secondary | ICD-10-CM | POA: Diagnosis not present

## 2018-01-10 DIAGNOSIS — H0100A Unspecified blepharitis right eye, upper and lower eyelids: Secondary | ICD-10-CM | POA: Diagnosis not present

## 2018-01-10 DIAGNOSIS — H25043 Posterior subcapsular polar age-related cataract, bilateral: Secondary | ICD-10-CM | POA: Diagnosis not present

## 2018-01-10 DIAGNOSIS — H2513 Age-related nuclear cataract, bilateral: Secondary | ICD-10-CM | POA: Diagnosis not present

## 2018-01-10 DIAGNOSIS — H04123 Dry eye syndrome of bilateral lacrimal glands: Secondary | ICD-10-CM | POA: Diagnosis not present

## 2018-02-08 ENCOUNTER — Telehealth: Payer: Self-pay | Admitting: Oncology

## 2018-02-08 NOTE — Telephone Encounter (Signed)
Faxed medical records to [place] at [phone] on 02/08/18, Release ID: [id number]

## 2018-02-10 DIAGNOSIS — E7849 Other hyperlipidemia: Secondary | ICD-10-CM | POA: Diagnosis not present

## 2018-04-14 DIAGNOSIS — Z981 Arthrodesis status: Secondary | ICD-10-CM | POA: Diagnosis not present

## 2018-04-14 DIAGNOSIS — M47812 Spondylosis without myelopathy or radiculopathy, cervical region: Secondary | ICD-10-CM | POA: Diagnosis not present

## 2018-04-14 DIAGNOSIS — M5030 Other cervical disc degeneration, unspecified cervical region: Secondary | ICD-10-CM | POA: Diagnosis not present

## 2018-04-14 DIAGNOSIS — S129XXA Fracture of neck, unspecified, initial encounter: Secondary | ICD-10-CM | POA: Diagnosis not present

## 2018-04-14 DIAGNOSIS — M542 Cervicalgia: Secondary | ICD-10-CM | POA: Diagnosis not present

## 2018-05-09 DIAGNOSIS — E7849 Other hyperlipidemia: Secondary | ICD-10-CM | POA: Diagnosis not present

## 2018-08-25 DIAGNOSIS — E7849 Other hyperlipidemia: Secondary | ICD-10-CM | POA: Diagnosis not present

## 2018-08-25 DIAGNOSIS — Z125 Encounter for screening for malignant neoplasm of prostate: Secondary | ICD-10-CM | POA: Diagnosis not present

## 2018-08-25 DIAGNOSIS — Z0000 Encounter for general adult medical examination without abnormal findings: Secondary | ICD-10-CM | POA: Diagnosis not present

## 2018-08-25 DIAGNOSIS — I10 Essential (primary) hypertension: Secondary | ICD-10-CM | POA: Diagnosis not present

## 2018-09-01 DIAGNOSIS — H6123 Impacted cerumen, bilateral: Secondary | ICD-10-CM | POA: Diagnosis not present

## 2018-09-01 DIAGNOSIS — I10 Essential (primary) hypertension: Secondary | ICD-10-CM | POA: Diagnosis not present

## 2018-09-01 DIAGNOSIS — E7849 Other hyperlipidemia: Secondary | ICD-10-CM | POA: Diagnosis not present

## 2018-09-01 DIAGNOSIS — Z8549 Personal history of malignant neoplasm of other male genital organs: Secondary | ICD-10-CM | POA: Diagnosis not present

## 2018-09-01 DIAGNOSIS — Z0000 Encounter for general adult medical examination without abnormal findings: Secondary | ICD-10-CM | POA: Diagnosis not present

## 2018-09-01 DIAGNOSIS — Z86718 Personal history of other venous thrombosis and embolism: Secondary | ICD-10-CM | POA: Diagnosis not present

## 2018-09-01 DIAGNOSIS — Z1389 Encounter for screening for other disorder: Secondary | ICD-10-CM | POA: Diagnosis not present

## 2018-09-05 DIAGNOSIS — L299 Pruritus, unspecified: Secondary | ICD-10-CM | POA: Diagnosis not present

## 2018-09-05 DIAGNOSIS — H6123 Impacted cerumen, bilateral: Secondary | ICD-10-CM | POA: Diagnosis not present

## 2018-09-05 DIAGNOSIS — H938X1 Other specified disorders of right ear: Secondary | ICD-10-CM | POA: Diagnosis not present

## 2018-09-05 DIAGNOSIS — B369 Superficial mycosis, unspecified: Secondary | ICD-10-CM | POA: Diagnosis not present

## 2018-09-05 DIAGNOSIS — H6241 Otitis externa in other diseases classified elsewhere, right ear: Secondary | ICD-10-CM | POA: Diagnosis not present

## 2018-09-07 DIAGNOSIS — Z1212 Encounter for screening for malignant neoplasm of rectum: Secondary | ICD-10-CM | POA: Diagnosis not present

## 2018-09-21 DIAGNOSIS — R1031 Right lower quadrant pain: Secondary | ICD-10-CM | POA: Diagnosis not present

## 2018-09-21 DIAGNOSIS — Z6830 Body mass index (BMI) 30.0-30.9, adult: Secondary | ICD-10-CM | POA: Diagnosis not present

## 2018-09-29 DIAGNOSIS — H6122 Impacted cerumen, left ear: Secondary | ICD-10-CM | POA: Diagnosis not present

## 2018-10-02 DIAGNOSIS — G4762 Sleep related leg cramps: Secondary | ICD-10-CM | POA: Diagnosis not present

## 2018-10-02 DIAGNOSIS — I10 Essential (primary) hypertension: Secondary | ICD-10-CM | POA: Diagnosis not present

## 2018-10-02 DIAGNOSIS — Z86718 Personal history of other venous thrombosis and embolism: Secondary | ICD-10-CM | POA: Diagnosis not present

## 2018-10-02 DIAGNOSIS — Z7901 Long term (current) use of anticoagulants: Secondary | ICD-10-CM | POA: Diagnosis not present

## 2018-10-23 DIAGNOSIS — Z5181 Encounter for therapeutic drug level monitoring: Secondary | ICD-10-CM | POA: Diagnosis not present

## 2018-10-23 DIAGNOSIS — Z86718 Personal history of other venous thrombosis and embolism: Secondary | ICD-10-CM | POA: Diagnosis not present

## 2018-10-23 DIAGNOSIS — Z7901 Long term (current) use of anticoagulants: Secondary | ICD-10-CM | POA: Diagnosis not present

## 2018-11-15 DIAGNOSIS — I82409 Acute embolism and thrombosis of unspecified deep veins of unspecified lower extremity: Secondary | ICD-10-CM | POA: Diagnosis not present

## 2019-04-04 ENCOUNTER — Other Ambulatory Visit: Payer: Self-pay | Admitting: Internal Medicine

## 2019-04-04 DIAGNOSIS — R1030 Lower abdominal pain, unspecified: Secondary | ICD-10-CM

## 2019-04-04 DIAGNOSIS — I10 Essential (primary) hypertension: Secondary | ICD-10-CM | POA: Diagnosis not present

## 2019-04-04 DIAGNOSIS — R1031 Right lower quadrant pain: Secondary | ICD-10-CM | POA: Diagnosis not present

## 2019-04-09 ENCOUNTER — Ambulatory Visit
Admission: RE | Admit: 2019-04-09 | Discharge: 2019-04-09 | Disposition: A | Discharge status: Home / Self Care | Payer: BLUE CROSS/BLUE SHIELD | Source: Ambulatory Visit | Attending: Internal Medicine | Admitting: Internal Medicine

## 2019-04-09 ENCOUNTER — Other Ambulatory Visit: Payer: Self-pay

## 2019-04-09 DIAGNOSIS — K4090 Unilateral inguinal hernia, without obstruction or gangrene, not specified as recurrent: Secondary | ICD-10-CM | POA: Diagnosis not present

## 2019-04-09 DIAGNOSIS — R1030 Lower abdominal pain, unspecified: Secondary | ICD-10-CM

## 2019-04-09 MED ORDER — IOPAMIDOL (ISOVUE-300) INJECTION 61%
125.0000 mL | Freq: Once | INTRAVENOUS | Status: AC | PRN
  Administered 2019-04-09: 125 mL via INTRAVENOUS

## 2019-04-10 ENCOUNTER — Other Ambulatory Visit (HOSPITAL_COMMUNITY): Payer: Self-pay | Admitting: Internal Medicine

## 2019-04-10 ENCOUNTER — Other Ambulatory Visit: Payer: Self-pay | Admitting: Internal Medicine

## 2019-04-10 DIAGNOSIS — R93429 Abnormal radiologic findings on diagnostic imaging of unspecified kidney: Secondary | ICD-10-CM

## 2019-04-18 ENCOUNTER — Other Ambulatory Visit (HOSPITAL_COMMUNITY): Payer: Self-pay | Admitting: Internal Medicine

## 2019-04-18 DIAGNOSIS — R93429 Abnormal radiologic findings on diagnostic imaging of unspecified kidney: Secondary | ICD-10-CM

## 2019-04-18 DIAGNOSIS — N2889 Other specified disorders of kidney and ureter: Secondary | ICD-10-CM

## 2019-04-19 ENCOUNTER — Encounter (HOSPITAL_COMMUNITY): Payer: Self-pay

## 2019-04-19 ENCOUNTER — Ambulatory Visit (HOSPITAL_COMMUNITY)
Admission: RE | Admit: 2019-04-19 | Discharge: 2019-04-19 | Disposition: A | Discharge status: Home / Self Care | Payer: BC Managed Care – PPO | Source: Ambulatory Visit | Attending: Internal Medicine | Admitting: Internal Medicine

## 2019-04-19 ENCOUNTER — Ambulatory Visit (HOSPITAL_COMMUNITY): Admission: RE | Admit: 2019-04-19 | Payer: BC Managed Care – PPO | Source: Ambulatory Visit

## 2019-04-19 ENCOUNTER — Other Ambulatory Visit: Payer: Self-pay

## 2019-04-19 DIAGNOSIS — N2889 Other specified disorders of kidney and ureter: Secondary | ICD-10-CM | POA: Diagnosis not present

## 2019-04-19 DIAGNOSIS — K4090 Unilateral inguinal hernia, without obstruction or gangrene, not specified as recurrent: Secondary | ICD-10-CM | POA: Diagnosis not present

## 2019-04-19 DIAGNOSIS — R93429 Abnormal radiologic findings on diagnostic imaging of unspecified kidney: Secondary | ICD-10-CM

## 2019-04-19 DIAGNOSIS — Z7901 Long term (current) use of anticoagulants: Secondary | ICD-10-CM | POA: Diagnosis not present

## 2019-04-19 LAB — CREATININE, SERUM
Creatinine, Ser: 0.83 mg/dL (ref 0.61–1.24)
GFR calc Af Amer: >60 mL/min (ref >60)
GFR calc non Af Amer: >60 mL/min (ref >60)

## 2019-04-19 MED ORDER — GADOBUTROL 1 MMOL/ML IV SOLN
9.0000 mL | Freq: Once | INTRAVENOUS | Status: AC | PRN
  Administered 2019-04-19: 9 mL via INTRAVENOUS

## 2019-05-31 DIAGNOSIS — Z1159 Encounter for screening for other viral diseases: Secondary | ICD-10-CM | POA: Diagnosis not present

## 2019-06-04 ENCOUNTER — Other Ambulatory Visit: Payer: Self-pay | Admitting: Surgery

## 2019-06-04 DIAGNOSIS — K4090 Unilateral inguinal hernia, without obstruction or gangrene, not specified as recurrent: Secondary | ICD-10-CM | POA: Diagnosis not present

## 2019-06-25 DIAGNOSIS — M779 Enthesopathy, unspecified: Secondary | ICD-10-CM | POA: Diagnosis not present

## 2019-06-25 DIAGNOSIS — I10 Essential (primary) hypertension: Secondary | ICD-10-CM | POA: Diagnosis not present

## 2019-06-25 DIAGNOSIS — M79671 Pain in right foot: Secondary | ICD-10-CM | POA: Diagnosis not present

## 2019-08-10 DIAGNOSIS — S60922A Unspecified superficial injury of left hand, initial encounter: Secondary | ICD-10-CM | POA: Diagnosis not present

## 2019-08-20 DIAGNOSIS — Z1211 Encounter for screening for malignant neoplasm of colon: Secondary | ICD-10-CM | POA: Diagnosis not present

## 2019-09-03 DIAGNOSIS — Z125 Encounter for screening for malignant neoplasm of prostate: Secondary | ICD-10-CM | POA: Diagnosis not present

## 2019-09-03 DIAGNOSIS — I10 Essential (primary) hypertension: Secondary | ICD-10-CM | POA: Diagnosis not present

## 2019-09-03 DIAGNOSIS — E7849 Other hyperlipidemia: Secondary | ICD-10-CM | POA: Diagnosis not present

## 2019-09-10 DIAGNOSIS — Z0000 Encounter for general adult medical examination without abnormal findings: Secondary | ICD-10-CM | POA: Diagnosis not present

## 2019-09-10 DIAGNOSIS — I10 Essential (primary) hypertension: Secondary | ICD-10-CM | POA: Diagnosis not present

## 2019-09-10 DIAGNOSIS — Z86718 Personal history of other venous thrombosis and embolism: Secondary | ICD-10-CM | POA: Diagnosis not present

## 2019-09-10 DIAGNOSIS — Z7689 Persons encountering health services in other specified circumstances: Secondary | ICD-10-CM | POA: Diagnosis not present

## 2019-09-10 DIAGNOSIS — E785 Hyperlipidemia, unspecified: Secondary | ICD-10-CM | POA: Diagnosis not present

## 2019-09-10 DIAGNOSIS — Z1331 Encounter for screening for depression: Secondary | ICD-10-CM | POA: Diagnosis not present

## 2019-11-05 DIAGNOSIS — Z86718 Personal history of other venous thrombosis and embolism: Secondary | ICD-10-CM | POA: Diagnosis not present

## 2019-11-05 DIAGNOSIS — Z7901 Long term (current) use of anticoagulants: Secondary | ICD-10-CM | POA: Diagnosis not present

## 2019-11-05 DIAGNOSIS — Z5181 Encounter for therapeutic drug level monitoring: Secondary | ICD-10-CM | POA: Diagnosis not present

## 2020-03-28 ENCOUNTER — Other Ambulatory Visit: Payer: Self-pay | Admitting: Internal Medicine

## 2020-03-28 DIAGNOSIS — I10 Essential (primary) hypertension: Secondary | ICD-10-CM | POA: Diagnosis not present

## 2020-03-28 DIAGNOSIS — R918 Other nonspecific abnormal finding of lung field: Secondary | ICD-10-CM

## 2020-03-28 DIAGNOSIS — Z23 Encounter for immunization: Secondary | ICD-10-CM | POA: Diagnosis not present

## 2020-09-22 DIAGNOSIS — M7542 Impingement syndrome of left shoulder: Secondary | ICD-10-CM | POA: Diagnosis not present

## 2020-10-06 DIAGNOSIS — Z0189 Encounter for other specified special examinations: Secondary | ICD-10-CM | POA: Diagnosis not present

## 2020-11-03 DIAGNOSIS — M7542 Impingement syndrome of left shoulder: Secondary | ICD-10-CM | POA: Diagnosis not present

## 2020-12-29 DIAGNOSIS — H6123 Impacted cerumen, bilateral: Secondary | ICD-10-CM | POA: Diagnosis not present

## 2021-01-29 DIAGNOSIS — M25512 Pain in left shoulder: Secondary | ICD-10-CM | POA: Diagnosis not present

## 2021-01-29 DIAGNOSIS — M7542 Impingement syndrome of left shoulder: Secondary | ICD-10-CM | POA: Diagnosis not present

## 2021-02-04 ENCOUNTER — Other Ambulatory Visit: Payer: Self-pay | Admitting: Physician Assistant

## 2021-02-04 DIAGNOSIS — M25512 Pain in left shoulder: Secondary | ICD-10-CM

## 2021-02-05 ENCOUNTER — Ambulatory Visit
Admission: RE | Admit: 2021-02-05 | Discharge: 2021-02-05 | Disposition: A | Discharge status: Home / Self Care | Payer: BC Managed Care – PPO | Source: Ambulatory Visit | Attending: Physician Assistant | Admitting: Physician Assistant

## 2021-02-05 ENCOUNTER — Other Ambulatory Visit: Payer: Self-pay

## 2021-02-05 DIAGNOSIS — M19012 Primary osteoarthritis, left shoulder: Secondary | ICD-10-CM | POA: Diagnosis not present

## 2021-02-05 DIAGNOSIS — M25512 Pain in left shoulder: Secondary | ICD-10-CM

## 2021-02-08 ENCOUNTER — Other Ambulatory Visit: Payer: BC Managed Care – PPO

## 2021-02-17 DIAGNOSIS — M7542 Impingement syndrome of left shoulder: Secondary | ICD-10-CM | POA: Diagnosis not present

## 2021-05-26 DIAGNOSIS — B029 Zoster without complications: Secondary | ICD-10-CM | POA: Diagnosis not present

## 2021-05-28 ENCOUNTER — Other Ambulatory Visit: Payer: Self-pay

## 2021-05-28 ENCOUNTER — Emergency Department (HOSPITAL_COMMUNITY)
Admission: EM | Admit: 2021-05-28 | Discharge: 2021-05-29 | Disposition: A | Discharge status: Home / Self Care | Payer: No Typology Code available for payment source | Attending: Emergency Medicine | Admitting: Emergency Medicine

## 2021-05-28 ENCOUNTER — Encounter (HOSPITAL_COMMUNITY): Payer: Self-pay | Admitting: Emergency Medicine

## 2021-05-28 DIAGNOSIS — Z79899 Other long term (current) drug therapy: Secondary | ICD-10-CM | POA: Insufficient documentation

## 2021-05-28 DIAGNOSIS — I10 Essential (primary) hypertension: Secondary | ICD-10-CM | POA: Insufficient documentation

## 2021-05-28 DIAGNOSIS — R339 Retention of urine, unspecified: Secondary | ICD-10-CM | POA: Insufficient documentation

## 2021-05-28 DIAGNOSIS — K635 Polyp of colon: Secondary | ICD-10-CM | POA: Diagnosis not present

## 2021-05-28 DIAGNOSIS — Z87891 Personal history of nicotine dependence: Secondary | ICD-10-CM | POA: Insufficient documentation

## 2021-05-28 DIAGNOSIS — N289 Disorder of kidney and ureter, unspecified: Secondary | ICD-10-CM | POA: Diagnosis not present

## 2021-05-28 DIAGNOSIS — N320 Bladder-neck obstruction: Secondary | ICD-10-CM | POA: Diagnosis not present

## 2021-05-28 DIAGNOSIS — R1030 Lower abdominal pain, unspecified: Secondary | ICD-10-CM | POA: Diagnosis not present

## 2021-05-28 NOTE — ED Triage Notes (Signed)
Pt sent from [place] with urinary retention. Pt had MRI before coming showing bladder distention. States his last full void was 2 days ago.

## 2021-05-28 NOTE — ED Notes (Signed)
Bladder scan 999+

## 2021-05-28 NOTE — ED Provider Notes (Signed)
Emergency Medicine Provider Triage Evaluation Note  Alex [name] , a 70 y.o. male  was evaluated in triage.  Pt complains of urinary tension over last couple days.  He says he has not urinated in 2 days.  Had a recent MRI done that showed he had 1.9 L in his bladder.  He complains of suprapubic abdominal pain.  Denies any other symptoms.  Review of Systems  Positive:  Negative: See above  Physical Exam  BP (!) 153/93 (BP Location: Right Arm)   Pulse (!) 111   Temp 98.4 F (36.9 C) (Oral)   Resp 18   SpO2 99%  Gen:   Awake, no distress   Resp:  Normal effort  MSK:   Moves extremities without difficulty  Other:  Moderate suprapubic tenderness.  Medical Decision Making  Medically screening exam initiated at 6:43 PM.  Appropriate orders placed.  [name] was informed that the remainder of the evaluation will be completed by another provider, this initial triage assessment does not replace that evaluation, and the importance of remaining in the ED until their evaluation is complete.  Bladder scan done in triage which showed over a liter of urine.  Catheter placed in triage.   Myna Bright [place], PA-C 05/28/21 1845    Davonna Belling, MD 05/28/21 2225

## 2021-05-29 NOTE — ED Provider Notes (Addendum)
[facility] EMERGENCY DEPARTMENT Provider Note   CSN: [id number] Arrival date & time: 05/28/21  1750     History Chief Complaint  Patient presents with   Urinary Retention    Alex [name] is a 70 y.o. male.  HPI 70 year old male with a history of DVT, headache, hypercholesteremia, hypertension presents to the ER with complaints of urinary retention.  He [facility] and was found to have 1.9 L of urine in his bladder.  He has a history of strictured urinary tract with surgery years ago.  Denies any history of prostate issues.  He denies any back pain, loss of bowel bladder control, numbness or tingling in his legs.  Denies any fevers or chills.  Per record review that the patient brought from the [place], labs  done 9/29 with no leukocytosis, UA without evidence of infection, creatinine of 0.793 and GFR of 96.  No significant electrolyte abnormalities noted.  CT of the abdomen with findings as below: Ureters are unremarkable, marked distended bladder with urine reaching the umbilical region concerning for underlying outlet obstruction.  Total volume of 1.9 L.  Some mild perivillous vesicle soft tissue stranding probably consistent with ongoing outlet obstruction.    Past Medical History:  Diagnosis Date   DVT (deep venous thrombosis) ([place])    Headache    Hypercholesterolemia    Hypertension     Patient Active Problem List   Diagnosis Date Noted   HNP (herniated nucleus pulposus), cervical 11/28/2017   Personal history of DVT (deep vein thrombosis) 05/14/2017   Hypertension 05/14/2017   Cellulitis of left leg 05/14/2017    Past Surgical History:  Procedure Laterality Date   ANTERIOR CERVICAL DECOMP/DISCECTOMY FUSION N/A 11/28/2017   Procedure: ANTERIOR CERVICAL DECOMPRESSION/DISCECTOMY FUSION CERVICAL THREE- CERVICAL FOUR;  Surgeon: Jovita Gamma, MD;  Location: [place];  Service: Neurosurgery;  Laterality: N/A;   ANTERIOR  FUSION CLIVUS-C2 EXTRAORAL W/ ODONTOID EXCISION     SHOULDER ARTHROSCOPY     URINARY SURGERY     YRS AGO       Family History  Problem Relation Age of Onset   Breast cancer Mother    Colon cancer Neg Hx    Rectal cancer Neg Hx    Stomach cancer Neg Hx    Esophageal cancer Neg Hx     Social History   Tobacco Use   Smoking status: Former   Smokeless tobacco: Current    Types: Snuff   Tobacco comments:    quit 30 yrs ago  Vaping Use   Vaping Use: Never used  Substance Use Topics   Alcohol use: Yes    Comment: occassional   Drug use: No    Home Medications Prior to Admission medications   Medication Sig Start Date End Date Taking? Authorizing Provider  HYDROcodone-acetaminophen (NORCO/VICODIN) 5-325 MG tablet Take 1-2 tablets by mouth every 4 (four) hours as needed (pain). 11/29/17   Jovita Gamma, MD  lisinopril (PRINIVIL,ZESTRIL) 10 MG tablet Take 5 mg by mouth daily.     [provider]  rivaroxaban (XARELTO) 20 MG TABS tablet Take 20 mg by mouth daily.     [provider]    Allergies    Vancomycin  Review of Systems   Review of Systems Ten systems reviewed and are negative for acute change, except as noted in the HPI.   Physical Exam Updated Vital Signs BP 132/84 (BP Location: Left Arm)   Pulse  90   Temp 98.4 F (36.9 C) (Oral)   Resp 18   SpO2 100%   Physical Exam Vitals and nursing note reviewed.  Constitutional:      Appearance: He is well-developed.  HENT:     Head: Normocephalic and atraumatic.  Eyes:     Conjunctiva/sclera: Conjunctivae normal.  Cardiovascular:     Rate and Rhythm: Normal rate and regular rhythm.     Heart sounds: No murmur heard. Pulmonary:     Effort: Pulmonary effort is normal. No respiratory distress.     Breath sounds: Normal breath sounds.  Abdominal:     Palpations: Abdomen is soft.     Tenderness: There is no abdominal tenderness.  Musculoskeletal:        General: Normal range of motion.      Cervical back: Neck supple.  Skin:    General: Skin is warm and dry.  Neurological:     Mental Status: He is alert.    ED Results / Procedures / Treatments   Labs (all labs ordered are listed, but only abnormal results are displayed) Labs Reviewed - No data to display  EKG None  Radiology No results found.  Procedures Procedures   Medications Ordered in ED Medications - No data to display  ED Course  I have reviewed the triage vital signs and the nursing notes.  Pertinent labs & imaging results that were available during my care of the patient were reviewed by me and considered in my medical decision making (see chart for details).    MDM Rules/Calculators/A&P                          70 year old male with urinary retention.  Had a Foley placed here in triage with 1750 mL.  Notes significant improvement in his abdominal distention and discomfort.  Referral to urology provided. Reviewed his lab work and CT scan results that he brought with him, no signs of acute urinary infection, normal renal function, do not think he needs repeat lab work at this time.  We discussed return precautions.  Low suspicion for cauda equina.  Stable for discharge. Final Clinical Impression(s) / ED Diagnoses Final diagnoses:  Urinary retention    Rx / DC Orders ED Discharge Orders     None              Garald Balding, PA-C 05/29/21 [place], [place], DO 05/29/21 [phone]

## 2021-05-29 NOTE — Discharge Instructions (Addendum)
Please make sure to follow-up with the urologist whose contact information I have provided within a week.  Return to the ER for any new or worsening symptoms.

## 2021-06-05 DIAGNOSIS — N320 Bladder-neck obstruction: Secondary | ICD-10-CM | POA: Diagnosis not present

## 2021-06-10 DIAGNOSIS — N401 Enlarged prostate with lower urinary tract symptoms: Secondary | ICD-10-CM | POA: Diagnosis not present

## 2021-06-10 DIAGNOSIS — R338 Other retention of urine: Secondary | ICD-10-CM | POA: Diagnosis not present

## 2021-06-25 DIAGNOSIS — R338 Other retention of urine: Secondary | ICD-10-CM | POA: Diagnosis not present

## 2021-06-25 DIAGNOSIS — N401 Enlarged prostate with lower urinary tract symptoms: Secondary | ICD-10-CM | POA: Diagnosis not present

## 2021-07-06 DIAGNOSIS — N401 Enlarged prostate with lower urinary tract symptoms: Secondary | ICD-10-CM | POA: Diagnosis not present

## 2021-07-06 DIAGNOSIS — R338 Other retention of urine: Secondary | ICD-10-CM | POA: Diagnosis not present

## 2021-09-11 DIAGNOSIS — I82409 Acute embolism and thrombosis of unspecified deep veins of unspecified lower extremity: Secondary | ICD-10-CM | POA: Diagnosis not present

## 2021-09-11 DIAGNOSIS — I10 Essential (primary) hypertension: Secondary | ICD-10-CM | POA: Diagnosis not present

## 2021-09-11 DIAGNOSIS — E785 Hyperlipidemia, unspecified: Secondary | ICD-10-CM | POA: Diagnosis not present

## 2021-09-11 DIAGNOSIS — N320 Bladder-neck obstruction: Secondary | ICD-10-CM | POA: Diagnosis not present

## 2021-09-11 DIAGNOSIS — Z23 Encounter for immunization: Secondary | ICD-10-CM | POA: Diagnosis not present

## 2021-10-05 DIAGNOSIS — N401 Enlarged prostate with lower urinary tract symptoms: Secondary | ICD-10-CM | POA: Diagnosis not present

## 2021-10-05 DIAGNOSIS — R338 Other retention of urine: Secondary | ICD-10-CM | POA: Diagnosis not present

## 2021-12-25 DIAGNOSIS — M25551 Pain in right hip: Secondary | ICD-10-CM | POA: Diagnosis not present

## 2021-12-25 DIAGNOSIS — I10 Essential (primary) hypertension: Secondary | ICD-10-CM | POA: Diagnosis not present

## 2021-12-25 DIAGNOSIS — M1611 Unilateral primary osteoarthritis, right hip: Secondary | ICD-10-CM | POA: Diagnosis not present

## 2022-01-04 DIAGNOSIS — M25551 Pain in right hip: Secondary | ICD-10-CM | POA: Diagnosis not present

## 2022-02-09 DIAGNOSIS — M545 Low back pain, unspecified: Secondary | ICD-10-CM | POA: Diagnosis not present

## 2022-02-09 DIAGNOSIS — M25551 Pain in right hip: Secondary | ICD-10-CM | POA: Diagnosis not present

## 2022-02-17 DIAGNOSIS — M7542 Impingement syndrome of left shoulder: Secondary | ICD-10-CM | POA: Diagnosis not present

## 2022-02-17 DIAGNOSIS — M25551 Pain in right hip: Secondary | ICD-10-CM | POA: Diagnosis not present

## 2022-02-17 DIAGNOSIS — M1612 Unilateral primary osteoarthritis, left hip: Secondary | ICD-10-CM | POA: Diagnosis not present

## 2022-02-17 DIAGNOSIS — M25512 Pain in left shoulder: Secondary | ICD-10-CM | POA: Diagnosis not present

## 2022-02-19 DIAGNOSIS — R296 Repeated falls: Secondary | ICD-10-CM | POA: Diagnosis not present

## 2022-02-19 DIAGNOSIS — Z1329 Encounter for screening for other suspected endocrine disorder: Secondary | ICD-10-CM | POA: Diagnosis not present

## 2022-02-19 DIAGNOSIS — S32030A Wedge compression fracture of third lumbar vertebra, initial encounter for closed fracture: Secondary | ICD-10-CM | POA: Diagnosis not present

## 2022-02-22 ENCOUNTER — Other Ambulatory Visit: Payer: Self-pay | Admitting: Family Medicine

## 2022-02-22 DIAGNOSIS — S32030A Wedge compression fracture of third lumbar vertebra, initial encounter for closed fracture: Secondary | ICD-10-CM

## 2022-02-27 ENCOUNTER — Ambulatory Visit
Admission: RE | Admit: 2022-02-27 | Discharge: 2022-02-27 | Disposition: A | Discharge status: Home / Self Care | Payer: BC Managed Care – PPO | Source: Ambulatory Visit | Attending: Family Medicine | Admitting: Family Medicine

## 2022-02-27 DIAGNOSIS — S32020A Wedge compression fracture of second lumbar vertebra, initial encounter for closed fracture: Secondary | ICD-10-CM | POA: Diagnosis not present

## 2022-02-27 DIAGNOSIS — S32030A Wedge compression fracture of third lumbar vertebra, initial encounter for closed fracture: Secondary | ICD-10-CM | POA: Diagnosis not present

## 2022-02-27 DIAGNOSIS — M48061 Spinal stenosis, lumbar region without neurogenic claudication: Secondary | ICD-10-CM | POA: Diagnosis not present

## 2022-02-27 DIAGNOSIS — S32031A Stable burst fracture of third lumbar vertebra, initial encounter for closed fracture: Secondary | ICD-10-CM | POA: Diagnosis not present

## 2022-02-27 MED ORDER — GADOBENATE DIMEGLUMINE 529 MG/ML IV SOLN
20.0000 mL | Freq: Once | INTRAVENOUS | Status: AC | PRN
  Administered 2022-02-27: 20 mL via INTRAVENOUS

## 2022-03-04 DIAGNOSIS — S32020A Wedge compression fracture of second lumbar vertebra, initial encounter for closed fracture: Secondary | ICD-10-CM | POA: Diagnosis not present

## 2022-03-05 DIAGNOSIS — S32020A Wedge compression fracture of second lumbar vertebra, initial encounter for closed fracture: Secondary | ICD-10-CM | POA: Diagnosis not present

## 2022-03-08 ENCOUNTER — Other Ambulatory Visit: Payer: BC Managed Care – PPO

## 2022-03-12 DIAGNOSIS — Z86718 Personal history of other venous thrombosis and embolism: Secondary | ICD-10-CM | POA: Diagnosis not present

## 2022-03-12 DIAGNOSIS — E785 Hyperlipidemia, unspecified: Secondary | ICD-10-CM | POA: Diagnosis not present

## 2022-03-12 DIAGNOSIS — I10 Essential (primary) hypertension: Secondary | ICD-10-CM | POA: Diagnosis not present

## 2022-03-12 DIAGNOSIS — Z7901 Long term (current) use of anticoagulants: Secondary | ICD-10-CM | POA: Diagnosis not present

## 2022-03-15 ENCOUNTER — Other Ambulatory Visit: Payer: Self-pay | Admitting: Student

## 2022-03-15 DIAGNOSIS — S32020A Wedge compression fracture of second lumbar vertebra, initial encounter for closed fracture: Secondary | ICD-10-CM

## 2022-03-21 ENCOUNTER — Emergency Department (HOSPITAL_COMMUNITY): Payer: No Typology Code available for payment source

## 2022-03-21 ENCOUNTER — Inpatient Hospital Stay (HOSPITAL_COMMUNITY)
Admission: EM | Admit: 2022-03-21 | Discharge: 2022-03-29 | DRG: 522 | Disposition: A | Discharge status: Skilled Nursing Facility | Payer: No Typology Code available for payment source | Attending: Internal Medicine | Admitting: Internal Medicine

## 2022-03-21 ENCOUNTER — Other Ambulatory Visit: Payer: Self-pay

## 2022-03-21 ENCOUNTER — Encounter (HOSPITAL_COMMUNITY): Payer: Self-pay

## 2022-03-21 ENCOUNTER — Inpatient Hospital Stay (HOSPITAL_COMMUNITY): Payer: No Typology Code available for payment source

## 2022-03-21 DIAGNOSIS — R338 Other retention of urine: Secondary | ICD-10-CM | POA: Diagnosis present

## 2022-03-21 DIAGNOSIS — Z86711 Personal history of pulmonary embolism: Secondary | ICD-10-CM | POA: Diagnosis not present

## 2022-03-21 DIAGNOSIS — G8929 Other chronic pain: Secondary | ICD-10-CM | POA: Diagnosis present

## 2022-03-21 DIAGNOSIS — H9190 Unspecified hearing loss, unspecified ear: Secondary | ICD-10-CM | POA: Diagnosis present

## 2022-03-21 DIAGNOSIS — Y92511 Restaurant or cafe as the place of occurrence of the external cause: Secondary | ICD-10-CM | POA: Diagnosis not present

## 2022-03-21 DIAGNOSIS — R600 Localized edema: Secondary | ICD-10-CM | POA: Diagnosis not present

## 2022-03-21 DIAGNOSIS — K5900 Constipation, unspecified: Secondary | ICD-10-CM | POA: Diagnosis not present

## 2022-03-21 DIAGNOSIS — Y9301 Activity, walking, marching and hiking: Secondary | ICD-10-CM | POA: Diagnosis present

## 2022-03-21 DIAGNOSIS — Z981 Arthrodesis status: Secondary | ICD-10-CM | POA: Diagnosis not present

## 2022-03-21 DIAGNOSIS — M160 Bilateral primary osteoarthritis of hip: Secondary | ICD-10-CM | POA: Diagnosis present

## 2022-03-21 DIAGNOSIS — S50312A Abrasion of left elbow, initial encounter: Secondary | ICD-10-CM | POA: Diagnosis present

## 2022-03-21 DIAGNOSIS — W010XXA Fall on same level from slipping, tripping and stumbling without subsequent striking against object, initial encounter: Secondary | ICD-10-CM | POA: Diagnosis present

## 2022-03-21 DIAGNOSIS — Z79899 Other long term (current) drug therapy: Secondary | ICD-10-CM | POA: Diagnosis not present

## 2022-03-21 DIAGNOSIS — I10 Essential (primary) hypertension: Secondary | ICD-10-CM | POA: Diagnosis not present

## 2022-03-21 DIAGNOSIS — R531 Weakness: Secondary | ICD-10-CM | POA: Diagnosis not present

## 2022-03-21 DIAGNOSIS — S72002D Fracture of unspecified part of neck of left femur, subsequent encounter for closed fracture with routine healing: Secondary | ICD-10-CM | POA: Diagnosis not present

## 2022-03-21 DIAGNOSIS — M1611 Unilateral primary osteoarthritis, right hip: Secondary | ICD-10-CM | POA: Diagnosis not present

## 2022-03-21 DIAGNOSIS — Z471 Aftercare following joint replacement surgery: Secondary | ICD-10-CM | POA: Diagnosis not present

## 2022-03-21 DIAGNOSIS — N401 Enlarged prostate with lower urinary tract symptoms: Secondary | ICD-10-CM | POA: Diagnosis present

## 2022-03-21 DIAGNOSIS — Z86718 Personal history of other venous thrombosis and embolism: Secondary | ICD-10-CM | POA: Diagnosis not present

## 2022-03-21 DIAGNOSIS — Z803 Family history of malignant neoplasm of breast: Secondary | ICD-10-CM | POA: Diagnosis not present

## 2022-03-21 DIAGNOSIS — Z87891 Personal history of nicotine dependence: Secondary | ICD-10-CM | POA: Diagnosis not present

## 2022-03-21 DIAGNOSIS — M549 Dorsalgia, unspecified: Secondary | ICD-10-CM | POA: Diagnosis present

## 2022-03-21 DIAGNOSIS — Z96642 Presence of left artificial hip joint: Secondary | ICD-10-CM | POA: Diagnosis not present

## 2022-03-21 DIAGNOSIS — M1612 Unilateral primary osteoarthritis, left hip: Secondary | ICD-10-CM | POA: Diagnosis not present

## 2022-03-21 DIAGNOSIS — Z7901 Long term (current) use of anticoagulants: Secondary | ICD-10-CM | POA: Diagnosis not present

## 2022-03-21 DIAGNOSIS — Z881 Allergy status to other antibiotic agents status: Secondary | ICD-10-CM | POA: Diagnosis not present

## 2022-03-21 DIAGNOSIS — Z01818 Encounter for other preprocedural examination: Secondary | ICD-10-CM | POA: Diagnosis not present

## 2022-03-21 DIAGNOSIS — E785 Hyperlipidemia, unspecified: Secondary | ICD-10-CM | POA: Diagnosis present

## 2022-03-21 DIAGNOSIS — Z043 Encounter for examination and observation following other accident: Secondary | ICD-10-CM | POA: Diagnosis not present

## 2022-03-21 DIAGNOSIS — L100 Pemphigus vulgaris: Secondary | ICD-10-CM | POA: Diagnosis not present

## 2022-03-21 DIAGNOSIS — M5020 Other cervical disc displacement, unspecified cervical region: Secondary | ICD-10-CM | POA: Diagnosis not present

## 2022-03-21 DIAGNOSIS — E7800 Pure hypercholesterolemia, unspecified: Secondary | ICD-10-CM | POA: Diagnosis present

## 2022-03-21 DIAGNOSIS — S72002A Fracture of unspecified part of neck of left femur, initial encounter for closed fracture: Principal | ICD-10-CM | POA: Diagnosis present

## 2022-03-21 DIAGNOSIS — I82409 Acute embolism and thrombosis of unspecified deep veins of unspecified lower extremity: Secondary | ICD-10-CM | POA: Diagnosis not present

## 2022-03-21 DIAGNOSIS — W19XXXA Unspecified fall, initial encounter: Secondary | ICD-10-CM | POA: Diagnosis not present

## 2022-03-21 DIAGNOSIS — Z7401 Bed confinement status: Secondary | ICD-10-CM | POA: Diagnosis not present

## 2022-03-21 DIAGNOSIS — R9431 Abnormal electrocardiogram [ECG] [EKG]: Secondary | ICD-10-CM | POA: Diagnosis not present

## 2022-03-21 DIAGNOSIS — R000 Tachycardia, unspecified: Secondary | ICD-10-CM | POA: Diagnosis not present

## 2022-03-21 LAB — PROTIME-INR
INR: 1.1 (ref 0.8–1.2)
Prothrombin Time: 14.4 seconds (ref 11.4–15.2)

## 2022-03-21 LAB — CBC WITH DIFFERENTIAL/PLATELET
Abs Immature Granulocytes: 0.09 10*3/uL — ABNORMAL HIGH (ref 0.00–0.07)
Basophils Absolute: 0 10*3/uL (ref 0.0–0.1)
Basophils Relative: 0 %
Eosinophils Absolute: 0.1 10*3/uL (ref 0.0–0.5)
Eosinophils Relative: 1 %
HCT: 47.5 % (ref 39.0–52.0)
Hemoglobin: 16.4 g/dL (ref 13.0–17.0)
Immature Granulocytes: 1 %
Lymphocytes Relative: 18 %
Lymphs Abs: 1.6 10*3/uL (ref 0.7–4.0)
MCH: 31.3 pg (ref 26.0–34.0)
MCHC: 34.5 g/dL (ref 30.0–36.0)
MCV: 90.6 fL (ref 80.0–100.0)
Monocytes Absolute: 0.8 10*3/uL (ref 0.1–1.0)
Monocytes Relative: 9 %
Neutro Abs: 6.3 10*3/uL (ref 1.7–7.7)
Neutrophils Relative %: 71 %
Platelets: 239 10*3/uL (ref 150–400)
RBC: 5.24 MIL/uL (ref 4.22–5.81)
RDW: 13.8 % (ref 11.5–15.5)
WBC: 8.8 10*3/uL (ref 4.0–10.5)
nRBC: 0 % (ref 0.0–0.2)

## 2022-03-21 LAB — TYPE AND SCREEN
ABO/RH(D): O POS
Antibody Screen: NEGATIVE

## 2022-03-21 LAB — TSH: TSH: 1.084 u[IU]/mL (ref 0.350–4.500)

## 2022-03-21 LAB — CK: Total CK: 50 U/L (ref 49–397)

## 2022-03-21 LAB — BASIC METABOLIC PANEL
Anion gap: 8 (ref 5–15)
BUN: 15 mg/dL (ref 8–23)
CO2: 25 mmol/L (ref 22–32)
Calcium: 8.8 mg/dL — ABNORMAL LOW (ref 8.9–10.3)
Chloride: 102 mmol/L (ref 98–111)
Creatinine, Ser: 1.17 mg/dL (ref 0.61–1.24)
GFR, Estimated: >60 mL/min (ref >60)
Glucose, Bld: 155 mg/dL — ABNORMAL HIGH (ref 70–99)
Potassium: 4.2 mmol/L (ref 3.5–5.1)
Sodium: 135 mmol/L (ref 135–145)

## 2022-03-21 LAB — HEPATIC FUNCTION PANEL
ALT: 24 U/L (ref 0–44)
AST: 20 U/L (ref 15–41)
Albumin: 3.1 g/dL — ABNORMAL LOW (ref 3.5–5.0)
Alkaline Phosphatase: 84 U/L (ref 38–126)
Bilirubin, Direct: 0.1 mg/dL (ref 0.0–0.2)
Indirect Bilirubin: 0.7 mg/dL (ref 0.3–0.9)
Total Bilirubin: 0.8 mg/dL (ref 0.3–1.2)
Total Protein: 6 g/dL — ABNORMAL LOW (ref 6.5–8.1)

## 2022-03-21 LAB — TROPONIN I (HIGH SENSITIVITY): Troponin I (High Sensitivity): 5 ng/L (ref <18)

## 2022-03-21 LAB — MAGNESIUM: Magnesium: 1.9 mg/dL (ref 1.7–2.4)

## 2022-03-21 LAB — HEMOGLOBIN A1C
Hgb A1c MFr Bld: 5.4 % (ref 4.8–5.6)
Mean Plasma Glucose: 108.28 mg/dL

## 2022-03-21 LAB — PHOSPHORUS: Phosphorus: 2.6 mg/dL (ref 2.5–4.6)

## 2022-03-21 MED ORDER — SODIUM CHLORIDE 0.9 % IV BOLUS
500.0000 mL | Freq: Once | INTRAVENOUS | Status: AC
  Administered 2022-03-21: 500 mL via INTRAVENOUS

## 2022-03-21 MED ORDER — FENTANYL CITRATE PF 50 MCG/ML IJ SOSY
50.0000 ug | PREFILLED_SYRINGE | Freq: Once | INTRAMUSCULAR | Status: AC
  Administered 2022-03-21: 50 ug via INTRAVENOUS
  Filled 2022-03-21: qty 1

## 2022-03-21 MED ORDER — HYDROMORPHONE HCL 1 MG/ML IJ SOLN
0.5000 mg | INTRAMUSCULAR | Status: DC | PRN
  Administered 2022-03-21 – 2022-03-22 (×3): 0.5 mg via INTRAVENOUS
  Filled 2022-03-21: qty 1
  Filled 2022-03-21: qty 0.5
  Filled 2022-03-21: qty 1

## 2022-03-21 MED ORDER — HYDROCODONE-ACETAMINOPHEN 5-325 MG PO TABS
1.0000 | ORAL_TABLET | Freq: Four times a day (QID) | ORAL | Status: DC | PRN
  Administered 2022-03-21: 1 via ORAL
  Administered 2022-03-22: 2 via ORAL
  Administered 2022-03-22: 1 via ORAL
  Filled 2022-03-21: qty 2
  Filled 2022-03-21 (×2): qty 1

## 2022-03-21 MED ORDER — MORPHINE SULFATE (PF) 2 MG/ML IV SOLN: 0.5000 mg | INTRAVENOUS | Status: DC | PRN

## 2022-03-21 MED ORDER — DEXTROSE 5 % IV SOLN: 500.0000 mg | Freq: Four times a day (QID) | INTRAVENOUS | Status: DC | PRN

## 2022-03-21 MED ORDER — ATORVASTATIN CALCIUM 10 MG PO TABS
20.0000 mg | ORAL_TABLET | Freq: Every day | ORAL | Status: DC
  Administered 2022-03-22 – 2022-03-29 (×8): 20 mg via ORAL
  Filled 2022-03-21 (×8): qty 2

## 2022-03-21 MED ORDER — METHOCARBAMOL 500 MG PO TABS
500.0000 mg | ORAL_TABLET | Freq: Four times a day (QID) | ORAL | Status: DC | PRN
  Administered 2022-03-21 – 2022-03-22 (×4): 500 mg via ORAL
  Filled 2022-03-21 (×3): qty 1

## 2022-03-21 NOTE — ED Notes (Signed)
Bladder scan shows 130m of urine in bladder.

## 2022-03-21 NOTE — Assessment & Plan Note (Signed)
Continue Lipitor 20 mg p.o. daily 

## 2022-03-21 NOTE — Progress Notes (Signed)
Patient discussed with EDP.  Imaging reviewed.  There is a displaced femoral neck fx.  Patient will likely benefit from THA vs hemi.  Will need to delay surgery given patient on Xarelto last dose 03/20/2022 PM.  Admit to medicine.  NWB LLE.  Full consult to follow.  Georgeanna Harrison M.D. Orthopaedic Surgery [facility]

## 2022-03-21 NOTE — Consult Note (Signed)
Orthopaedic Consult  Date/Time: 03/21/22 7:58 PM  Patient Name: Alex [name]  Attending Physician: Toy Baker, MD    ASSESSMENT & PLAN  Orthopaedic Assessment: 71 y.o. male with left displaced femoral neck fracture and bilateral hip osteoarthritis.  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  Reductions/Procedures/Splinting/Anesthesia Performed: Reductions: None Splinting/casting: None Procedure(s): None Anesthesia: N/A  Plan: Explained the nature of the injury to the patient and his family member who was present at bedside, and also participated in giving the history.  I explained that he has sustained a displaced fracture of the left femoral neck, and will likely benefit from hemiarthroplasty versus total hip arthroplasty.  He is well-established with the [facility] practice and is a patient of Dr. Alvan Dame, and would like to have Dr. Alvan Dame perform his surgery if possible.  All patient and family questions were addressed to their satisfaction.  Management was discussed with the emergency room physician.  Patient will be admitted to medical team for preoperative risk stratification, clearance, and optimization.  He will remain nonweightbearing on the left lower extremity.   Georgeanna Harrison M.D. Orthopaedic Surgery [facility]   Medical Decision Making  Amount/complexity of data: Is there a current pathologic fracture (e.g. neoplastic, osteoporotic insufficiency fracture)? Yes Independent interpretation of radiographic studies: Yes Review of radiology results (e.g. reports): Yes Tests ordered (e.g. additional radiographic studies, labs): No Lab results reviewed: Yes Reviewed old records: Yes History from another source (independent historian, e.g. family/friend/etc.): Yes Discussion of imaging, clinical data, and or management with independent medical provider: Yes Risk: Patient receiving IV controlled  substances for pain: Yes Fracture requiring manipulation: No Urgent or emergent (non-elective) surgery likely this admission: Yes Presence of medical comorbidities and/or surgical risk factors (e.g. current smoker, CAD, diabetes, COPD, CKD, etc.): Yes Closed fracture management WITHOUT manipulation: No Urgent minor procedure (e.g. joint aspiration, compartment pressure measurement, etc.): No Will likely need surgery as an outpatient: No     HPI Alex [name] is a 71 y.o. male. Orthopaedic consultation has specifically been requested to address this patient's current musculoskeletal presentation. He sustained a fall and had immediate pain in the left hip/thigh.  Pain is sharp and severe.  Pain is worse movement and better with rest and immobilization.  Since the fall the patient was not able to ambulate or bear weight due to pain.  At baseline the patient is ambulatory with no assistive devices.  He denies additional injuries.  He has previously seen Dr. Alvan Dame and was told he has bilateral hip osteoarthritis.  He did have pain in this hip prior to this injury, due to underlying osteoarthritis.  Patient and family would prefer Dr. Alvan Dame to perform his surgery if possible.   PMH Past Medical History:  Diagnosis Date   DVT (deep venous thrombosis) ([place])    Headache    Hypercholesterolemia    Hypertension      PSH Past Surgical History:  Procedure Laterality Date   ANTERIOR CERVICAL DECOMP/DISCECTOMY FUSION N/A 11/28/2017   Procedure: ANTERIOR CERVICAL DECOMPRESSION/DISCECTOMY FUSION CERVICAL THREE- CERVICAL FOUR;  Surgeon: Jovita Gamma, MD;  Location: [place];  Service: Neurosurgery;  Laterality: N/A;   ANTERIOR FUSION CLIVUS-C2 EXTRAORAL W/ ODONTOID EXCISION     SHOULDER ARTHROSCOPY     URINARY SURGERY     YRS AGO   Home Medications Prior to Admission medications   Medication Sig Start Date End Date Taking? Authorizing Provider  atorvastatin (LIPITOR) 20 MG tablet Take  20 mg by mouth at  bedtime. 01/30/22  Yes [provider]  cyclobenzaprine (FLEXERIL) 10 MG tablet Take 10 mg by mouth at bedtime. 02/11/22  Yes [provider]  finasteride (PROSCAR) 5 MG tablet Take 5 mg by mouth daily. 12/19/21  Yes [provider]  lisinopril (PRINIVIL,ZESTRIL) 10 MG tablet Take 10 mg by mouth every evening.   Yes [provider]  methocarbamol (ROBAXIN) 500 MG tablet Take 500-1,500 mg by mouth every 8 (eight) hours as needed for muscle spasms.   Yes [provider]  rivaroxaban (XARELTO) 10 MG TABS tablet Take 10 mg by mouth daily.   Yes [provider]  silodosin (RAPAFLO) 8 MG CAPS capsule Take 8 mg by mouth daily. 03/15/22  Yes [provider]  traMADol (ULTRAM) 50 MG tablet Take 50 mg by mouth every 6 (six) hours as needed for severe pain.   Yes [provider]  HYDROcodone-acetaminophen (NORCO/VICODIN) 5-325 MG tablet Take 1-2 tablets by mouth every 4 (four) hours as needed (pain). Patient not taking: Reported on 03/21/2022 11/29/17   Jovita Gamma, MD     Allergies Allergies  Allergen Reactions   Vancomycin Hives     Family History Family History  Problem Relation Age of Onset   Breast cancer Mother    Colon cancer Neg Hx    Rectal cancer Neg Hx    Stomach cancer Neg Hx    Esophageal cancer Neg Hx     Social History Social History   Socioeconomic History   Marital status: Divorced    Spouse name: Not on file   Number of children: Not on file   Years of education: Not on file   Highest education level: Not on file  Occupational History   Not on file  Tobacco Use   Smoking status: Former   Smokeless tobacco: Current    Types: Snuff   Tobacco comments:    quit 30 yrs ago  Vaping Use   Vaping Use: Never used  Substance and Sexual Activity   Alcohol use: Yes    Comment: occassional   Drug use: No   Sexual activity: Not on file  Other Topics Concern   Not on file  Social History Narrative   Not  on file   Social Determinants of Health   Financial Resource Strain: Not on file  Food Insecurity: Not on file  Transportation Needs: Not on file  Physical Activity: Not on file  Stress: Not on file  Social Connections: Not on file  Intimate Partner Violence: Not on file     Review of Systems MSK: As noted per HPI above GI: No current Nausea/vomiting ENT: Denies sore throat, epistaxis CV: Denies chest pain  Resp: No current shortness of breath  Other than mentioned above, there are no Constitutional, Neurological, Psychiatric, ENT, Ophthalmological, Cardiovascular, Respiratory, GI, GU, Musculoskeletal, Integumentary, Lymphatic, Endocrine or Allergic issues.     Imaging  Independent interpretation of orthopaedic-relevant films: Multiple radiographic views of the left hip and pelvis demonstrate displaced fracture of the left femoral neck.  Underlying degenerative changes are present in the hips bilaterally.  Radiographic results: DG HIP UNILAT WITH PELVIS 2-3 VIEWS LEFT  Result Date: 03/21/2022 CLINICAL DATA:  Trauma due to a fall. EXAM: DG HIP (WITH OR WITHOUT PELVIS) 2-3V LEFT COMPARISON:  None Available. FINDINGS: Patient positioning limits examination. There appears to be a transverse fracture of the left femoral neck with varus angulation of the fracture fragment. Pelvis appears grossly intact. SI  joints and symphysis pubis are not displaced. Soft tissues are unremarkable. Vascular calcifications. IMPRESSION: Transverse fracture of the left femoral neck with varus angulation. Electronically Signed   By: Lucienne Capers M.D.   On: 03/21/2022 18:08   MR Lumbar Spine W Wo Contrast  Result Date: 03/01/2022 CLINICAL DATA:  Initial evaluation for L3 compression fracture, left leg pain. EXAM: MRI LUMBAR SPINE WITHOUT AND WITH CONTRAST TECHNIQUE: Multiplanar and multiecho pulse sequences of the lumbar spine were obtained without and with intravenous contrast. CONTRAST:  66m MULTIHANCE  GADOBENATE DIMEGLUMINE 529 MG/ML IV SOLN COMPARISON:  None Available. FINDINGS: Segmentation: Standard. Lowest well-formed disc space labeled the L5-S1 level. Alignment: 3 mm facet mediated anterolisthesis of L5 on S1. Alignment otherwise normal preservation of the normal lumbar lordosis. Vertebrae: Acute to subacute compression fracture involving the superior endplate of L2 with mild 10% height loss without bony retropulsion. Additional acute to subacute burst type compression fracture involving the L3 vertebral body with up to 35% height loss without bony retropulsion. These are benign/mechanical in appearance. Underlying bone marrow signal intensity heterogeneous. Benign hemangioma noted within the L1 vertebral body. No worrisome osseous lesions. Prominent endplate Schmorl's node deformity with associated reactive marrow edema noted at the inferior endplate of TM57 Conus medullaris and cauda equina: Conus extends to the T12 level. Conus and cauda equina appear normal. Paraspinal and other soft tissues: Paraspinous soft tissues demonstrate no acute finding. Small simple cyst noted within the left kidney, benign in appearance, no follow-up imaging recommended. Disc levels: T12-L1: Mild disc bulge with disc desiccation. Mild facet spurring. No stenosis. L1-2: Disc desiccation with minimal disc bulge. No canal or foraminal stenosis. L2-3: Disc desiccation without significant disc bulge. Moderate bilateral facet arthrosis. No significant spinal stenosis. Foramina remain patent. L3-4: Degenerative intervertebral disc space narrowing with disc desiccation and diffuse disc bulge. Mild reactive endplate spurring. Moderate right worse than left facet arthrosis. Resultant mild narrowing of the left lateral recess, descending left L4 nerve root level. Central canal remains patent. Moderate right worse than left L3 foraminal stenosis. L4-5: Disc desiccation with mild disc bulge. Superimposed broad based left foraminal disc  protrusion (series 5, image 32). Moderate bilateral facet hypertrophy. Resultant mild bilateral subarticular stenosis. Central canal remains patent. Moderate left worse than right L4 foraminal stenosis. L5-S1: Anterolisthesis. Disc desiccation with minimal disc bulge. Moderate left worse than right facet arthrosis. No significant spinal stenosis. Mild bilateral L5 foraminal narrowing. IMPRESSION: 1. Acute to subacute compression fracture involving the superior endplate of L2 with mild 10% height loss without bony retropulsion. 2. Additional acute to subacute burst type compression fracture involving the L3 vertebral body with up to 35% height loss without bony retropulsion. 3. Degenerative disc bulge with left foraminal disc protrusion and facet hypertrophy at L4-5, resulting in moderate left worse than right L4 foraminal stenosis. 4. Disc bulge with facet hypertrophy at L3-4 with resultant moderate right worse than left L3 foraminal stenosis. Electronically Signed   By: BJeannine BogaM.D.   On: 03/01/2022 00:25   Labs  Recent Labs    03/21/22 1756  WBC 8.8  HGB 16.4  HCT 47.5  PLT 239   Recent Labs    03/21/22 1756  NA 135  K 4.2  CL 102  CO2 25  BUN 15  CREATININE 1.17  GLUCOSE 155*  CALCIUM 8.8*   Lab Results  Component Value Date   INR 1.1 03/21/2022   INR 1.06 11/28/2017        Physical Examination  Patient is a 71 y.o. year old male who is alert, well appearing, and in no distress, mood is alert.  Orientation: oriented to person, place, time, and general circumstances  Vital Signs: BP (!) 160/90   Pulse (!) 110   Temp 98 F (36.7 C) (Oral)   Resp (!) 29   Ht '6\' 2"'$  (1.88 m)   Wt 103.4 kg   SpO2 98%   BMI 29.27 kg/m    Gait: Unable to ambulate, supine on stretcher  Heart: Normal rate Lungs: Non-labored breathing Abdomen: Soft, Non-tender   Right Upper Extremity: Inspection: Atraumatic Palpation: Nontender ROM: Full, painless Strength:  Normal Sensation: Intact to light touch distally Skin: Intact Peripheral Vascular: Well perfused Joint Stability: No instability Reflexes: No pathologic Lymph Nodes: None Palpable Coordination: Intact, normal   Left Upper Extremity: Inspection: Atraumatic Palpation: Nontender ROM: Full, painless Strength: Normal Sensation: Intact to light touch distally Skin: Intact Peripheral Vascular: Well perfused Joint Stability: No instability Reflexes: No pathologic Lymph Nodes: None Palpable Coordination: Intact, normal    Right Lower Extremity: Inspection: Atraumatic Palpation: Nontender ROM: Full, painless Strength: Normal Sensation: Intact to light touch distally Skin: Intact Peripheral Vascular: Well perfused Joint Stability: No instability Reflexes: No pathologic Lymph Nodes: None Palpable Coordination: Intact, normal   Left Lower Extremity: Inspection: Held in flexed and externally rotated position Palpation: Tender over site of known femoral neck fracture ROM: Hip range of motion severely limited due to injury, and unable to range knee; normal ankle motion including dorsiflexion and plantarflexion Strength: Able to demonstrate intact dorsiflexion, plantarflexion, and EHL function against manual resistance Sensation: Intact to light touch distally in the superficial peroneal, deep peroneal, and tibial distributions Skin: Intact Peripheral Vascular: Normal DP and PT pulses, warm and well-perfused distally Joint Stability: Knee stable to varus and valgus stress Reflexes: No pathologic Lymph Nodes: None Palpable Coordination: Unable to assess    Pelvis: Skin: Intact Palpation: Nontender Stability: No instability      The review of the patient's medications does not in any way constitute an endorsement, by this clinician,  of their use, dosage, indications, route, efficacy, interactions, or other clinical parameters.  This note was generated within the [employer] EMR using  Dragon medical speech recognition software and may contain inherent errors or omissions not intended by the user. Grammatical and punctuation errors, random word insertions, deletions, pronoun errors and incomplete sentences are occasional consequences of this technology due to software limitations. Not all errors are caught or corrected.  Although every attempt is made to root out erroneus and incomplete transcription, the note may still not fully represent the intent or opinion of the author. If there are questions or concerns about the content of this note or information contained within the body of this dictation they should be addressed directly with the author for clarification.

## 2022-03-21 NOTE — ED Provider Notes (Signed)
[facility] EMERGENCY DEPARTMENT Provider Note   CSN: [id number] Arrival date & time: 03/21/22  1740     History  Chief Complaint  Patient presents with   Level 2 - Fall on Thinners   Lt Hip Pain    Alex [name] is a 71 y.o. male.  Patient is a 71 year old male who presents after a fall.  He says he was at [employer] and that she was stepping from the carpet to the linoleum floor, he slipped and fell onto his left hip.  He has pain to his left hip.  He denies any other injuries.  He is on Xarelto but he is adamant that he did not hit his head.  He has some recent compression fractures to his back but does not feel that he is hurting any differently in his back than his baseline pain.  He does not have any neck pain.  He has an abrasion to his left elbow but denies any underlying tenderness to this area.  Per chart review, he has a history of hypertension, hyperlipidemia DVT on Xarelto.       Home Medications Prior to Admission medications   Medication Sig Start Date End Date Taking? Authorizing Provider  cyclobenzaprine (FLEXERIL) 10 MG tablet Take 10 mg by mouth at bedtime. 02/11/22  Yes [provider]  lisinopril (PRINIVIL,ZESTRIL) 10 MG tablet Take 10 mg by mouth every evening.   Yes [provider]  atorvastatin (LIPITOR) 20 MG tablet Take 20 mg by mouth at bedtime. 01/30/22   [provider]  HYDROcodone-acetaminophen (NORCO/VICODIN) 5-325 MG tablet Take 1-2 tablets by mouth every 4 (four) hours as needed (pain). 11/29/17   Jovita Gamma, MD  rivaroxaban (XARELTO) 20 MG TABS tablet Take 20 mg by mouth daily.     [provider]      Allergies    Vancomycin    Review of Systems   Review of Systems  Constitutional:  Negative for fever.  Gastrointestinal:  Negative for nausea and vomiting.  Musculoskeletal:  Positive for arthralgias and back pain (Chronic). Negative for joint swelling and neck pain.  Skin:  Negative  for wound.  Neurological:  Negative for weakness, numbness and headaches.  All other systems reviewed and are negative.   Physical Exam Updated Vital Signs BP (!) 160/90   Pulse (!) 110   Temp 98 F (36.7 C) (Oral)   Resp (!) 29   Ht '6\' 2"'$  (1.88 m)   Wt 103.4 kg   SpO2 98%   BMI 29.27 kg/m  Physical Exam Constitutional:      Appearance: He is well-developed.  HENT:     Head: Normocephalic and atraumatic.  Eyes:     Pupils: Pupils are equal, round, and reactive to light.  Neck:     Comments: No pain along the cervical, thoracic or lumbosacral spine Cardiovascular:     Rate and Rhythm: Normal rate and regular rhythm.     Heart sounds: Normal heart sounds.  Pulmonary:     Effort: Pulmonary effort is normal. No respiratory distress.     Breath sounds: Normal breath sounds. No wheezing or rales.  Chest:     Chest wall: No tenderness.  Abdominal:     General: Bowel sounds are normal.     Palpations: Abdomen is soft.     Tenderness: There is no abdominal tenderness. There is no guarding or rebound.  Musculoskeletal:        General: Normal range of  motion.     Cervical back: Normal range of motion and neck supple.     Comments: Positive tenderness on palpation and movement of the left hip, pedal pulses are intact, no pain to the knee or ankle.  No other pain on palpation or range of motion of the extremities  Lymphadenopathy:     Cervical: No cervical adenopathy.  Skin:    General: Skin is warm and dry.     Findings: No rash.  Neurological:     Mental Status: He is alert and oriented to person, place, and time.     ED Results / Procedures / Treatments   Labs (all labs ordered are listed, but only abnormal results are displayed) Labs Reviewed  BASIC METABOLIC PANEL - Abnormal; Notable for the following components:      Result Value   Glucose, Bld 155 (*)    Calcium 8.8 (*)    All other components within normal limits  CBC WITH DIFFERENTIAL/PLATELET - Abnormal;  Notable for the following components:   Abs Immature Granulocytes 0.09 (*)    All other components within normal limits  PROTIME-INR  CK  TSH  HEPATIC FUNCTION PANEL  MAGNESIUM  PHOSPHORUS  URINALYSIS, COMPLETE (UACMP) WITH MICROSCOPIC  PREALBUMIN  HEMOGLOBIN A1C  TYPE AND SCREEN  TROPONIN I (HIGH SENSITIVITY)    EKG EKG Interpretation  Date/Time:  Sunday March 21 2022 19:26:07 EDT Ventricular Rate:  106 PR Interval:  162 QRS Duration: 88 QT Interval:  321 QTC Calculation: 427 R Axis:   -20 Text Interpretation: Sinus tachycardia Probable left atrial enlargement Borderline left axis deviation Abnormal R-wave progression, early transition ST elevation, consider inferior injury Confirmed by Malvin Johns [phone]) on 03/21/2022 7:30:08 PM  Radiology DG HIP UNILAT WITH PELVIS 2-3 VIEWS LEFT  Result Date: 03/21/2022 CLINICAL DATA:  Trauma due to a fall. EXAM: DG HIP (WITH OR WITHOUT PELVIS) 2-3V LEFT COMPARISON:  None Available. FINDINGS: Patient positioning limits examination. There appears to be a transverse fracture of the left femoral neck with varus angulation of the fracture fragment. Pelvis appears grossly intact. SI joints and symphysis pubis are not displaced. Soft tissues are unremarkable. Vascular calcifications. IMPRESSION: Transverse fracture of the left femoral neck with varus angulation. Electronically Signed   By: Lucienne Capers M.D.   On: 03/21/2022 18:08    Procedures Procedures    Medications Ordered in ED Medications  sodium chloride 0.9 % bolus 500 mL (has no administration in time range)  fentaNYL (SUBLIMAZE) injection 50 mcg (50 mcg Intravenous Given 03/21/22 1805)  fentaNYL (SUBLIMAZE) injection 50 mcg (50 mcg Intravenous Given 03/21/22 1928)    ED Course/ Medical Decision Making/ A&P                           Medical Decision Making Amount and/or Complexity of Data Reviewed Labs: ordered. Radiology: ordered.  Risk Prescription drug  management. Decision regarding hospitalization.   Patient is a 71 year old who presents after a mechanical fall.  He presented as a level 2 trauma given that he is on anticoagulants.  He complains of pain to his left hip.  He denies any other injuries other than an abrasion to his left elbow but no underlying bony tenderness.  He is adamant that he did not hit his head.  He says he caught himself with his arms and did not have any trauma to his head or neck.  He denies any spinal pain other than some chronic  back issues that is unchanged from his baseline.  X-rays reveal evidence of a left hip fracture.  These were interpreted by me and confirmed by the radiologist.  His labs are nonconcerning.  His glucose is mildly elevated.  He is mildly tachycardic.  His EKG shows a sinus tachycardia.  He was given IV fluids and pain medications.  I spoke with Dr. Mable Fill with orthopedic surgery who will plan on doing operative repair of the hip.  He plans on doing the surgery tomorrow.  I spoke with Dr. Roel Cluck who will admit the patient for further treatment.  Final Clinical Impression(s) / ED Diagnoses Final diagnoses:  Closed left hip fracture, initial encounter [facility])    Rx / [place] Orders ED Discharge Orders     None         Malvin Johns, MD 03/21/22 1932

## 2022-03-21 NOTE — ED Notes (Signed)
X-ray at bedside

## 2022-03-21 NOTE — Progress Notes (Signed)
Orthopedic Tech Progress Note Patient Details:  Alex [name] 1951/05/14 [id number]  Level 2 trauma.  Patient ID: Alex [name], male   DOB: 1951/01/14, 71 y.o.   MRN: [id number]  [name] 03/21/2022, 5:58 PM

## 2022-03-21 NOTE — Assessment & Plan Note (Signed)
Given slight bump in creatinine hold off on lisinopril for tonight

## 2022-03-21 NOTE — ED Notes (Signed)
Report received from [place], [place]. Patient resting in bed. Family at bedside.

## 2022-03-21 NOTE — H&P (Addendum)
Alex [name] WRU:[id number] DOB: [birth year] DOA: 03/21/2022     PCP: Velna Hatchet, MD   Outpatient Specialists:     Oncology  [facility]  Patient arrived to ER on 03/21/22 at 78 Referred by Attending Malvin Johns, MD   Patient coming from:    home Lives alone,         Chief Complaint:   Chief Complaint  Patient presents with   Level 2 - Fall on Thinners   Lt Hip Pain    HPI: Alex [name] is a 71 y.o. 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    CXR -  NON acute  Following Medications were ordered in ER: Medications  fentaNYL (SUBLIMAZE) injection 50 mcg (50 mcg Intravenous Given 03/21/22 1805)    _______________________________________________________ ER Provider Called:  orthopedics   Dr.Looney They Recommend admit to medicine  Will see in AM     ED Triage Vitals  Enc Vitals Group     BP 03/21/22 1755 (!) 159/87     Pulse Rate 03/21/22 1755 (!) 114      Resp 03/21/22 1755 (!) 27     Temp 03/21/22 1755 98 F (36.7 C)     Temp Source 03/21/22 1755 Oral     SpO2 03/21/22 1754 94 %     Weight 03/21/22 1747 228 lb (103.4 kg)     Height 03/21/22 1747 '6\' 2"'$  (1.88 m)     Head Circumference --      Peak Flow --      Pain Score 03/21/22 1758 10     Pain Loc --      Pain Edu? --      Excl. in [place]? --   TMAX(24)@     _________________________________________ Significant initial  Findings: Abnormal Labs Reviewed  BASIC METABOLIC PANEL - Abnormal; Notable for the following components:      Result Value   Glucose, Bld 155 (*)    Calcium 8.8 (*)    All other components within normal limits  CBC WITH DIFFERENTIAL/PLATELET - Abnormal; Notable for the following components:   Abs Immature Granulocytes 0.09 (*)    All other components within normal limits     _________________________ Troponin 5 ECG: Ordered Personally reviewed and interpreted by me showing: HR : 106  Rhythm:Sinus tachycardia Probable left atrial enlargement Borderline left axis deviation Abnormal R-wave progression, early transition ST elevation, consider inferior injury QTC 426   The recent clinical data is shown below. Vitals:   03/21/22 1815 03/21/22 1830 03/21/22 1845 03/21/22 1900  BP: 140/82 (!) 154/83 (!) 149/87 (!) 160/90  Pulse: (!) 109 (!) 109 (!) 110 (!) 110  Resp: (!) 30 (!) 24 (!) 26 (!) 29  Temp:      TempSrc:      SpO2: 91% 98% 95% 98%  Weight:      Height:        WBC     Component Value Date/Time   WBC 8.8 03/21/2022 1756   LYMPHSABS 1.6 03/21/2022 1756   MONOABS 0.8 03/21/2022 1756   EOSABS 0.1 03/21/2022 1756   BASOSABS 0.0 03/21/2022 1756      Results for orders placed or performed during the hospital encounter of 11/18/17  Surgical pcr screen     Status: Abnormal   Collection Time: 11/18/17  3:51 PM   Specimen: Nasal Mucosa; Nasal Swab  Result Value Ref Range Status   MRSA, PCR NEGATIVE NEGATIVE Final   Staphylococcus aureus  POSITIVE (A) NEGATIVE Final    Comment: (NOTE) The Xpert SA Assay (FDA approved for NASAL specimens in patients 9 years of age and older), is one component of a comprehensive surveillance program. It is not intended to diagnose infection nor to guide or monitor treatment. Performed at [facility], [place] [address] [place], [place] [address]   _______________________________________________ Hospitalist was called for admission for left femoral neck fracture   The following Work up has been ordered so far:  Orders Placed This Encounter  Procedures   DG HIP UNILAT WITH PELVIS 2-3 VIEWS LEFT   Basic metabolic panel   CBC with Differential   Protime-INR   Consult to orthopedic surgery   Consult to hospitalist   Type and screen [place] initial  Findings:  labs showing:    Recent Labs  Lab 03/21/22 1756  NA 135  K 4.2  CO2 25  GLUCOSE 155*  BUN 15  CREATININE 1.17  CALCIUM 8.8*    Cr   stable,  Lab Results  Component Value Date   CREATININE 1.17 03/21/2022   CREATININE 0.83 04/19/2019   CREATININE 0.90 11/18/2017    Recent Labs  Lab 03/21/22 1756  AST 20  ALT 24  ALKPHOS 84  BILITOT 0.8  PROT 6.0*  ALBUMIN 3.1*   Lab Results  Component Value Date   CALCIUM 8.8 (L) 03/21/2022       Plt: Lab Results  Component Value Date   PLT 239 03/21/2022       Recent Labs  Lab 03/21/22 1756  WBC 8.8  NEUTROABS 6.3  HGB 16.4  HCT 47.5  MCV 90.6  PLT 239    HG/HCT   stable,       Component Value Date/Time   HGB 16.4 03/21/2022 1756   HCT 47.5 03/21/2022 1756   MCV 90.6 03/21/2022 1756      Cardiac Panel (last 3 results) Recent Labs    03/21/22 1756  CKTOTAL 50      DM  labs:  HbA1C: Recent Labs    03/21/22 1756  HGBA1C 5.4         Cultures:    Component Value Date/Time   SDES BLOOD LEFT ANTECUBITAL 05/13/2017 2325   SPECREQUEST  05/13/2017 2325  BOTTLES DRAWN AEROBIC AND  ANAEROBIC Blood Culture adequate volume   CULT NO GROWTH 5 DAYS 05/13/2017 2325   REPTSTATUS 05/19/2017 FINAL 05/13/2017 2325     Radiological Exams on Admission: DG CHEST PORT 1 VIEW  Result Date: 03/21/2022 CLINICAL DATA:  Preop chest x-ray.  Fall. EXAM: PORTABLE CHEST 1 VIEW COMPARISON:  None Available. FINDINGS: The heart size and mediastinal contours are within normal limits. There is atherosclerotic calcification of the aorta. No consolidation, effusion, or pneumothorax. Cervical spinal fusion hardware is noted. No acute osseous abnormality. IMPRESSION: No active disease. Electronically Signed   By: Brett Fairy M.D.   On: 03/21/2022 20:12   DG HIP UNILAT WITH PELVIS 2-3 VIEWS LEFT  Result Date: 03/21/2022 CLINICAL DATA:  Trauma due to a fall. EXAM: DG HIP (WITH OR WITHOUT PELVIS) 2-3V LEFT COMPARISON:  None Available. FINDINGS: Patient positioning limits examination. There appears to be a transverse fracture of the left femoral neck with varus angulation of the fracture fragment. Pelvis appears grossly intact. SI joints and symphysis pubis are not displaced. Soft tissues are unremarkable. Vascular calcifications. IMPRESSION: Transverse fracture of the left femoral neck with varus angulation. Electronically Signed   By: Lucienne Capers M.D.   On: 03/21/2022 18:08   _______________________________________________________________________________________________________ Latest  Blood pressure (!) 160/90, pulse (!) 110, temperature 98 F (36.7 C), temperature source Oral, resp. rate (!) 29, height '6\' 2"'$  (1.88 m), weight 103.4 kg, SpO2 98 %.   Vitals  labs and radiology finding personally reviewed  Review of Systems:    Pertinent positives include:  back pain left hip pain  joint pain   Constitutional:  No weight loss, night sweats, Fevers, chills, fatigue, weight loss  HEENT:  No headaches, Difficulty swallowing,Tooth/dental problems,Sore throat,  No sneezing, itching, ear ache, nasal  congestion, post nasal drip,  Cardio-vascular:  No chest pain, Orthopnea, PND, anasarca, dizziness, palpitations.no Bilateral lower extremity swelling  GI:  No heartburn, indigestion, abdominal pain, nausea, vomiting, diarrhea, change in bowel habits, loss of appetite, melena, blood in stool, hematemesis Resp:  no shortness of breath at rest. No dyspnea on exertion, No excess mucus, no productive cough, No non-productive cough, No coughing up of blood.No change in color of mucus.No wheezing. Skin:  no rash or lesions. No jaundice GU:  no dysuria, change in color of urine, no urgency or frequency. No straining to urinate.  No flank pain.  Musculoskeletal:   no joint swelling. No decreased range of motion. No back pain.  Psych:  No change in mood or affect. No depression or anxiety. No memory loss.  Neuro: no localizing neurological complaints, no tingling, no weakness, no double vision, no gait abnormality, no slurred speech, no confusion  All systems reviewed and apart from [place] all are negative _______________________________________________________________________________________________ Past Medical History:   Past Medical History:  Diagnosis Date   DVT (deep venous thrombosis) ([place])    Headache    Hypercholesterolemia    Hypertension       Past Surgical History:  Procedure Laterality Date   ANTERIOR CERVICAL DECOMP/DISCECTOMY FUSION N/A 11/28/2017   Procedure: ANTERIOR CERVICAL DECOMPRESSION/DISCECTOMY FUSION CERVICAL THREE- CERVICAL FOUR;  Surgeon: Jovita Gamma, MD;  Location: [facility];  Service: Neurosurgery;  Laterality: N/A;   ANTERIOR FUSION CLIVUS-C2 EXTRAORAL W/ ODONTOID EXCISION     SHOULDER ARTHROSCOPY     URINARY SURGERY     YRS AGO    Social History:  Ambulatory   walker      reports that he has quit smoking.  His smokeless tobacco use includes snuff. He reports current alcohol use. He reports that he does not use drugs.     Family History:   Family  History  Problem Relation Age of Onset   Breast cancer Mother    Colon cancer Neg Hx    Rectal cancer Neg Hx    Stomach cancer Neg Hx    Esophageal cancer Neg Hx    ______________________________________________________________________________________________ Allergies: Allergies  Allergen Reactions   Vancomycin Hives     Prior to Admission medications   Medication Sig Start Date End Date Taking? Authorizing Provider  HYDROcodone-acetaminophen (NORCO/VICODIN) 5-325 MG tablet Take 1-2 tablets by mouth every 4 (four) hours as needed (pain). 11/29/17   Jovita Gamma, MD  lisinopril (PRINIVIL,ZESTRIL) 10 MG tablet Take 5 mg by mouth daily.     [provider]  rivaroxaban (XARELTO) 20 MG TABS tablet Take 20 mg by mouth daily.     [provider]    ___________________________________________________________________________________________________ Physical Exam:    03/21/2022    7:00 PM 03/21/2022    6:45 PM 03/21/2022    6:30 PM  Vitals with BMI  Systolic 237 628 315  Diastolic 90 87 83  Pulse 176 110 109     1. General:  in No  Acute distress    Chronically ill   -appearing 2. Psychological: Alert and   Oriented 3. Head/ENT:    Dry Mucous Membranes                          Head Non traumatic, neck supple                          Poor Dentition 4. SKIN:  decreased Skin turgor,  Skin clean Dry and intact no rash 5. Heart: Regular rate and rhythm no  Murmur, no Rub or gallop 6. Lungs:  , no wheezes or crackles   7. Abdomen: Soft,  non-tender,   distended   obese  bowel sounds present 8. Lower extremities: no clubbing, cyanosis, no  edema 9. Neurologically Grossly intact, moving all 4 extremities equally   10. MSK: Normal range of motion limited in left leg    Chart has been reviewed  ______________________________________________________________________________________________  Assessment/Plan 71 y.o. 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 Would resume when orthopedics considered to be safe  Closed left hip fracture ([place])  - management as per orthopedics,  plan to operate    Keep nothing by mouth post midnight. Patient on anticoagulation  on hold Ordered type and screen, Place Foley if retaining, order a vitamin D level  Patient at baseline  able to walk a flight of stairs or 100 feet    Although have been having  Back pain    Patient denies any chest pain or shortness of breath currently and/or with exertion,  ECG showing non-specific changes  no known history of coronary artery disease, COPD   Liver failure  CKD  Given advanced age patient is at least moderate  Risk  has been discussed with family  will order echo and one set of CE  If echo abnormal will need cardiology consult     Hyperlipidemia Continue Lipitor 20 mg po daily  Acute urinary retention Noted to have urinary retention Patient unable to urinate bladder scan 500 Place Foley catheter may need to follow-up with urology   Other plan as per orders.  DVT prophylaxis:  SCD      Code Status:    Code Status: Prior FULL CODE  as per patient    I had personally discussed CODE STATUS with patient     Family Communication:   Family at  Bedside  plan of care was discussed   with   Daughter,   Disposition Plan:     likely will need placement for rehabilitation                             Following barriers for discharge:                                                         Pain controlled with PO medications                          Hip repaired                           Will need consultants to evaluate patient prior to discharge   Consults called: orthopedics are aware  Admission status:  ED Disposition     ED Disposition  [facility]: [place] [100100]  Level of Care: Progressive [102]  Admit to Progressive based on following criteria: CARDIOVASCULAR & THORACIC of moderate stability with acute coronary syndrome symptoms/low risk myocardial infarction/hypertensive urgency/arrhythmias/heart failure potentially compromising stability and stable post cardiovascular intervention patients.  May admit patient to [facility] or [facility] if equivalent level of care is available:: No  Covid Evaluation: Asymptomatic - no recent exposure (last 10 days) testing not required  Diagnosis: Closed left hip fracture [facility]) [[id number]]  Admitting Physician: Toy Baker [[id number]]  Attending Physician: Toy Baker [[id number]]  Certification:: I certify this patient will need inpatient services for at least 2 midnights  Estimated Length of Stay: 2            inpatient     I Expect 2 midnight stay secondary to severity of patient's current illness need for inpatient interventions justified by the following:  hemodynamic instability despite optimal treatment (tachycardia  )  Severe lab/radiological/exam abnormalities including:    Left hip fracture and extensive comorbidities including:   Chronic anticoagulation  That are currently affecting medical management.   I expect  patient to be hospitalized for 2 midnights requiring inpatient medical care.  Patient is at high risk for adverse outcome (such as loss of life or disability) if not treated.  Indication for inpatient stay as follows:    severe pain requiring acute inpatient management,  inability to maintain oral hydration    Need for operative/procedural  intervention     Need for  IV fluids, I   IV pain medications,      Level of care     progressive  tele indefinitely please discontinue once patient no longer qualifies COVID-19 Labs    Vallory Oetken 03/22/2022, 12:42 AM    [facility]     after 2 AM please page floor coverage PA If 7AM-7PM, please contact the day team taking care of the patient using [email or web address]   Patient was evaluated in the context of the global COVID-19 pandemic, which necessitated consideration that the patient might be at risk for infection with the SARS-CoV-2 virus that causes COVID-19. Institutional protocols and algorithms that pertain to the evaluation of patients at risk for COVID-19 are in a state of rapid change based on information released by regulatory bodies including the CDC and federal and state organizations. These policies and algorithms were followed during the patient's care.

## 2022-03-21 NOTE — Subjective & Objective (Signed)
Patient fell down today while at a restaurant was walking to the [employer] out using his walker he is usually on Xarelto Head no LOC but had severe left hip pain with rotation EMS was called on arrival vitals 178/94 heart rate 129 satting 94% on room air and CBG 212 He is on Xarelto dose was yesterday

## 2022-03-21 NOTE — Assessment & Plan Note (Signed)
Hold Xarelto for the time of procedure. Would resume when orthopedics considered to be safe

## 2022-03-21 NOTE — ED Triage Notes (Signed)
Pt BIB [facility] from restaurant as Level 2 fall on thinners while he was walking to the buffet without his walker. Is on Xarelto, no LOC or n/v/dizziness. A/Ox4, has sever Lt Hip Pain with lateral rotation noted to that extremely. 178/94, 129 bpm, 22 resp, 94% on RA, CBG 216. Usually uses a walker.

## 2022-03-21 NOTE — Assessment & Plan Note (Addendum)
-   management as per orthopedics,  plan to operate    Keep nothing by mouth post midnight. Patient on anticoagulation  on hold Ordered type and screen, Place Foley if retaining, order a vitamin D level  Patient at baseline  able to walk a flight of stairs or 100 feet    Although have been having  Back pain    Patient denies any chest pain or shortness of breath currently and/or with exertion,  ECG showing non-specific changes  no known history of coronary artery disease, COPD   Liver failure  CKD  Given advanced age patient is at least moderate  Risk  has been discussed with family   will order echo and one set of CE  If echo abnormal will need cardiology consult

## 2022-03-22 ENCOUNTER — Inpatient Hospital Stay (HOSPITAL_COMMUNITY): Payer: No Typology Code available for payment source

## 2022-03-22 ENCOUNTER — Other Ambulatory Visit: Payer: Self-pay

## 2022-03-22 ENCOUNTER — Encounter (HOSPITAL_COMMUNITY): Payer: Self-pay | Admitting: Internal Medicine

## 2022-03-22 ENCOUNTER — Inpatient Hospital Stay (HOSPITAL_COMMUNITY): Payer: No Typology Code available for payment source | Admitting: Anesthesiology

## 2022-03-22 ENCOUNTER — Inpatient Hospital Stay (HOSPITAL_COMMUNITY): Payer: No Typology Code available for payment source | Admitting: Certified Registered Nurse Anesthetist

## 2022-03-22 ENCOUNTER — Encounter (HOSPITAL_COMMUNITY)
Admission: EM | Disposition: A | Discharge status: Skilled Nursing Facility | Payer: Self-pay | Source: Home / Self Care | Attending: Internal Medicine

## 2022-03-22 DIAGNOSIS — R338 Other retention of urine: Secondary | ICD-10-CM | POA: Diagnosis present

## 2022-03-22 DIAGNOSIS — R9431 Abnormal electrocardiogram [ECG] [EKG]: Secondary | ICD-10-CM

## 2022-03-22 DIAGNOSIS — I82409 Acute embolism and thrombosis of unspecified deep veins of unspecified lower extremity: Secondary | ICD-10-CM | POA: Diagnosis not present

## 2022-03-22 DIAGNOSIS — S72002A Fracture of unspecified part of neck of left femur, initial encounter for closed fracture: Secondary | ICD-10-CM

## 2022-03-22 DIAGNOSIS — I10 Essential (primary) hypertension: Secondary | ICD-10-CM

## 2022-03-22 DIAGNOSIS — Z96642 Presence of left artificial hip joint: Secondary | ICD-10-CM

## 2022-03-22 HISTORY — PX: TOTAL HIP ARTHROPLASTY: SHX124

## 2022-03-22 LAB — PREALBUMIN: Prealbumin: 18 mg/dL (ref 18–38)

## 2022-03-22 LAB — CBC
HCT: 45.7 % (ref 39.0–52.0)
Hemoglobin: 15.6 g/dL (ref 13.0–17.0)
MCH: 31 pg (ref 26.0–34.0)
MCHC: 34.1 g/dL (ref 30.0–36.0)
MCV: 90.7 fL (ref 80.0–100.0)
Platelets: 203 10*3/uL (ref 150–400)
RBC: 5.04 MIL/uL (ref 4.22–5.81)
RDW: 13.9 % (ref 11.5–15.5)
WBC: 10 10*3/uL (ref 4.0–10.5)
nRBC: 0 % (ref 0.0–0.2)

## 2022-03-22 LAB — BASIC METABOLIC PANEL WITH GFR
Anion gap: 7 (ref 5–15)
BUN: 12 mg/dL (ref 8–23)
CO2: 27 mmol/L (ref 22–32)
Calcium: 8.8 mg/dL — ABNORMAL LOW (ref 8.9–10.3)
Chloride: 100 mmol/L (ref 98–111)
Creatinine, Ser: 0.93 mg/dL (ref 0.61–1.24)
GFR, Estimated: >60 mL/min
Glucose, Bld: 122 mg/dL — ABNORMAL HIGH (ref 70–99)
Potassium: 4.1 mmol/L (ref 3.5–5.1)
Sodium: 134 mmol/L — ABNORMAL LOW (ref 135–145)

## 2022-03-22 LAB — URINALYSIS, COMPLETE (UACMP) WITH MICROSCOPIC
Bacteria, UA: NONE SEEN
Bilirubin Urine: NEGATIVE
Glucose, UA: NEGATIVE mg/dL
Ketones, ur: NEGATIVE mg/dL
Leukocytes,Ua: NEGATIVE
Nitrite: NEGATIVE
Protein, ur: NEGATIVE mg/dL
Specific Gravity, Urine: 1.02 (ref 1.005–1.030)
pH: 6 (ref 5.0–8.0)

## 2022-03-22 LAB — ECHOCARDIOGRAM COMPLETE
Area-P 1/2: 4.49 cm2
Height: 74 in
S' Lateral: 3.5 cm
Weight: 3648 oz

## 2022-03-22 LAB — VITAMIN D 25 HYDROXY (VIT D DEFICIENCY, FRACTURES): Vit D, 25-Hydroxy: 11.29 ng/mL — ABNORMAL LOW (ref 30–100)

## 2022-03-22 LAB — SURGICAL PCR SCREEN
MRSA, PCR: NEGATIVE
Staphylococcus aureus: NEGATIVE

## 2022-03-22 LAB — HIV ANTIBODY (ROUTINE TESTING W REFLEX): HIV Screen 4th Generation wRfx: NONREACTIVE

## 2022-03-22 SURGERY — ARTHROPLASTY, HIP, TOTAL,POSTERIOR APPROACH
Anesthesia: General | Site: Hip | Laterality: Left

## 2022-03-22 MED ORDER — PROPOFOL 10 MG/ML IV BOLUS
INTRAVENOUS | Status: AC
  Filled 2022-03-22: qty 20

## 2022-03-22 MED ORDER — TAMSULOSIN HCL 0.4 MG PO CAPS
0.4000 mg | ORAL_CAPSULE | Freq: Every day | ORAL | Status: DC
  Administered 2022-03-22 – 2022-03-29 (×8): 0.4 mg via ORAL
  Filled 2022-03-22 (×8): qty 1

## 2022-03-22 MED ORDER — FENTANYL CITRATE (PF) 250 MCG/5ML IJ SOLN
INTRAMUSCULAR | Status: AC
  Filled 2022-03-22: qty 5

## 2022-03-22 MED ORDER — ACETAMINOPHEN 325 MG PO TABS: 325.0000 mg | ORAL_TABLET | Freq: Four times a day (QID) | ORAL | Status: DC | PRN

## 2022-03-22 MED ORDER — ONDANSETRON HCL 4 MG/2ML IJ SOLN: 4.0000 mg | Freq: Four times a day (QID) | INTRAMUSCULAR | Status: DC | PRN

## 2022-03-22 MED ORDER — LIDOCAINE 2% (20 MG/ML) 5 ML SYRINGE
INTRAMUSCULAR | Status: AC
  Filled 2022-03-22: qty 5

## 2022-03-22 MED ORDER — SODIUM CHLORIDE 0.9 % IR SOLN
Status: DC | PRN
  Administered 2022-03-22: 1000 mL

## 2022-03-22 MED ORDER — TRANEXAMIC ACID-NACL 1000-0.7 MG/100ML-% IV SOLN
1000.0000 mg | Freq: Once | INTRAVENOUS | Status: AC
  Administered 2022-03-22: 1000 mg via INTRAVENOUS
  Filled 2022-03-22: qty 100

## 2022-03-22 MED ORDER — CHLORHEXIDINE GLUCONATE 4 % EX LIQD: 60.0000 mL | Freq: Once | CUTANEOUS | Status: DC

## 2022-03-22 MED ORDER — METHOCARBAMOL 1000 MG/10ML IJ SOLN: 500.0000 mg | Freq: Four times a day (QID) | INTRAVENOUS | Status: DC | PRN

## 2022-03-22 MED ORDER — CEFAZOLIN SODIUM-DEXTROSE 2-4 GM/100ML-% IV SOLN
2.0000 g | Freq: Four times a day (QID) | INTRAVENOUS | Status: AC
  Administered 2022-03-22 – 2022-03-23 (×2): 2 g via INTRAVENOUS
  Filled 2022-03-22 (×2): qty 100

## 2022-03-22 MED ORDER — FENTANYL CITRATE (PF) 250 MCG/5ML IJ SOLN
INTRAMUSCULAR | Status: DC | PRN
  Administered 2022-03-22: 50 ug via INTRAVENOUS
  Administered 2022-03-22: 150 ug via INTRAVENOUS
  Administered 2022-03-22: 50 ug via INTRAVENOUS

## 2022-03-22 MED ORDER — SUGAMMADEX SODIUM 200 MG/2ML IV SOLN
INTRAVENOUS | Status: DC | PRN
  Administered 2022-03-22: 200 mg via INTRAVENOUS

## 2022-03-22 MED ORDER — METOCLOPRAMIDE HCL 5 MG/ML IJ SOLN: 5.0000 mg | Freq: Three times a day (TID) | INTRAMUSCULAR | Status: DC | PRN

## 2022-03-22 MED ORDER — POVIDONE-IODINE 10 % EX SWAB
2.0000 | Freq: Once | CUTANEOUS | Status: AC
  Administered 2022-03-22: 2 via TOPICAL

## 2022-03-22 MED ORDER — METHOCARBAMOL 500 MG PO TABS
500.0000 mg | ORAL_TABLET | Freq: Four times a day (QID) | ORAL | Status: DC | PRN
  Administered 2022-03-23: 500 mg via ORAL
  Filled 2022-03-22 (×2): qty 1

## 2022-03-22 MED ORDER — OXYCODONE HCL 5 MG PO TABS: 5.0000 mg | ORAL_TABLET | Freq: Once | ORAL | Status: DC | PRN

## 2022-03-22 MED ORDER — DEXAMETHASONE SODIUM PHOSPHATE 10 MG/ML IJ SOLN
INTRAMUSCULAR | Status: AC
  Filled 2022-03-22: qty 1

## 2022-03-22 MED ORDER — ROCURONIUM BROMIDE 10 MG/ML (PF) SYRINGE
PREFILLED_SYRINGE | INTRAVENOUS | Status: AC
  Filled 2022-03-22: qty 10

## 2022-03-22 MED ORDER — ROCURONIUM BROMIDE 10 MG/ML (PF) SYRINGE
PREFILLED_SYRINGE | INTRAVENOUS | Status: DC | PRN
  Administered 2022-03-22: 10 mg via INTRAVENOUS
  Administered 2022-03-22: 20 mg via INTRAVENOUS
  Administered 2022-03-22: 70 mg via INTRAVENOUS

## 2022-03-22 MED ORDER — METOCLOPRAMIDE HCL 5 MG PO TABS: 5.0000 mg | ORAL_TABLET | Freq: Three times a day (TID) | ORAL | Status: DC | PRN

## 2022-03-22 MED ORDER — CHLORHEXIDINE GLUCONATE 0.12 % MT SOLN: 15.0000 mL | Freq: Once | OROMUCOSAL | Status: DC

## 2022-03-22 MED ORDER — MIDAZOLAM HCL 2 MG/2ML IJ SOLN
INTRAMUSCULAR | Status: AC
  Filled 2022-03-22: qty 2

## 2022-03-22 MED ORDER — ONDANSETRON HCL 4 MG/2ML IJ SOLN
INTRAMUSCULAR | Status: DC | PRN
  Administered 2022-03-22: 4 mg via INTRAVENOUS

## 2022-03-22 MED ORDER — PERFLUTREN LIPID MICROSPHERE
1.0000 mL | INTRAVENOUS | Status: AC | PRN
  Administered 2022-03-22: 3 mL via INTRAVENOUS

## 2022-03-22 MED ORDER — ONDANSETRON HCL 4 MG/2ML IJ SOLN
INTRAMUSCULAR | Status: AC
  Filled 2022-03-22: qty 2

## 2022-03-22 MED ORDER — CHLORHEXIDINE GLUCONATE 0.12 % MT SOLN
OROMUCOSAL | Status: AC
  Administered 2022-03-22: 15 mL
  Filled 2022-03-22: qty 15

## 2022-03-22 MED ORDER — ORAL CARE MOUTH RINSE: 15.0000 mL | Freq: Once | OROMUCOSAL | Status: DC

## 2022-03-22 MED ORDER — DEXAMETHASONE SODIUM PHOSPHATE 10 MG/ML IJ SOLN
INTRAMUSCULAR | Status: DC | PRN
  Administered 2022-03-22: 5 mg via INTRAVENOUS

## 2022-03-22 MED ORDER — ONDANSETRON HCL 4 MG PO TABS: 4.0000 mg | ORAL_TABLET | Freq: Four times a day (QID) | ORAL | Status: DC | PRN

## 2022-03-22 MED ORDER — LACTATED RINGERS IV SOLN: INTRAVENOUS | Status: DC

## 2022-03-22 MED ORDER — TRANEXAMIC ACID-NACL 1000-0.7 MG/100ML-% IV SOLN
1000.0000 mg | INTRAVENOUS | Status: AC
  Administered 2022-03-22: 1000 mg via INTRAVENOUS
  Filled 2022-03-22: qty 100

## 2022-03-22 MED ORDER — FINASTERIDE 5 MG PO TABS
5.0000 mg | ORAL_TABLET | Freq: Every day | ORAL | Status: DC
  Administered 2022-03-22 – 2022-03-29 (×8): 5 mg via ORAL
  Filled 2022-03-22 (×8): qty 1

## 2022-03-22 MED ORDER — PROPOFOL 10 MG/ML IV BOLUS
INTRAVENOUS | Status: DC | PRN
  Administered 2022-03-22: 140 mg via INTRAVENOUS

## 2022-03-22 MED ORDER — OXYCODONE HCL 5 MG/5ML PO SOLN: 5.0000 mg | Freq: Once | ORAL | Status: DC | PRN

## 2022-03-22 MED ORDER — ROPIVACAINE HCL 5 MG/ML IJ SOLN
INTRAMUSCULAR | Status: DC | PRN
  Administered 2022-03-22: 30 mL via PERINEURAL

## 2022-03-22 MED ORDER — HYDROCODONE-ACETAMINOPHEN 7.5-325 MG PO TABS
1.0000 | ORAL_TABLET | ORAL | Status: DC | PRN
  Administered 2022-03-26: 1 via ORAL
  Filled 2022-03-22 (×2): qty 2

## 2022-03-22 MED ORDER — CEFAZOLIN SODIUM-DEXTROSE 2-4 GM/100ML-% IV SOLN
2.0000 g | INTRAVENOUS | Status: AC
  Administered 2022-03-22: 2 g via INTRAVENOUS
  Filled 2022-03-22: qty 100

## 2022-03-22 MED ORDER — PHENYLEPHRINE HCL-NACL 20-0.9 MG/250ML-% IV SOLN
INTRAVENOUS | Status: DC | PRN
  Administered 2022-03-22: 25 ug/min via INTRAVENOUS

## 2022-03-22 MED ORDER — CYCLOBENZAPRINE HCL 10 MG PO TABS
10.0000 mg | ORAL_TABLET | Freq: Every day | ORAL | Status: DC
Start: 2022-03-22 — End: 2022-03-23
  Administered 2022-03-22: 10 mg via ORAL
  Filled 2022-03-22: qty 1

## 2022-03-22 MED ORDER — HYDROCODONE-ACETAMINOPHEN 5-325 MG PO TABS
1.0000 | ORAL_TABLET | ORAL | Status: DC | PRN
  Administered 2022-03-24: 2 via ORAL
  Administered 2022-03-26 – 2022-03-27 (×2): 1 via ORAL
  Administered 2022-03-28: 2 via ORAL
  Filled 2022-03-22 (×2): qty 2
  Filled 2022-03-22: qty 1
  Filled 2022-03-22: qty 2
  Filled 2022-03-22: qty 1
  Filled 2022-03-22: qty 2

## 2022-03-22 MED ORDER — FENTANYL CITRATE (PF) 100 MCG/2ML IJ SOLN: 25.0000 ug | INTRAMUSCULAR | Status: DC | PRN

## 2022-03-22 MED ORDER — CHLORHEXIDINE GLUCONATE CLOTH 2 % EX PADS
6.0000 | MEDICATED_PAD | Freq: Every day | CUTANEOUS | Status: DC
  Administered 2022-03-24 – 2022-03-29 (×3): 6 via TOPICAL

## 2022-03-22 SURGICAL SUPPLY — 67 items
BAG COUNTER SPONGE SURGICOUNT (BAG) ×2 IMPLANT
BLADE CLIPPER SURG (BLADE) IMPLANT
BLADE SAW SGTL 18X1.27X75 (BLADE) ×2 IMPLANT
CELLS DAT CNTRL 66122 CELL SVR (MISCELLANEOUS) IMPLANT
COVER BACK TABLE 24X17X13 BIG (DRAPES) IMPLANT
COVER SURGICAL LIGHT HANDLE (MISCELLANEOUS) ×2 IMPLANT
CUP ACET PINNACLE SECTR 56MM (Hips) IMPLANT
DERMABOND ADVANCED (GAUZE/BANDAGES/DRESSINGS) ×1
DERMABOND ADVANCED .7 DNX12 (GAUZE/BANDAGES/DRESSINGS) ×1 IMPLANT
DRAPE C-ARM 42X72 X-RAY (DRAPES) ×2 IMPLANT
DRAPE IMP U-DRAPE 54X76 (DRAPES) ×2 IMPLANT
DRAPE STERI IOBAN 125X83 (DRAPES) ×2 IMPLANT
DRAPE U-SHAPE 47X51 STRL (DRAPES) ×5 IMPLANT
DRSG AQUACEL AG ADV 3.5X10 (GAUZE/BANDAGES/DRESSINGS) ×2 IMPLANT
DRSG TEGADERM 4X4.75 (GAUZE/BANDAGES/DRESSINGS) ×1 IMPLANT
DURAPREP 26ML APPLICATOR (WOUND CARE) ×2 IMPLANT
ELECT BLADE TIP CTD 4 INCH (ELECTRODE) ×1 IMPLANT
ELECT REM PT RETURN 9FT ADLT (ELECTROSURGICAL) ×2
ELECTRODE REM PT RTRN 9FT ADLT (ELECTROSURGICAL) ×1 IMPLANT
ELIMINATOR HOLE APEX DEPUY (Hips) ×1 IMPLANT
EVACUATOR 1/8 PVC DRAIN (DRAIN) ×1 IMPLANT
FACESHIELD WRAPAROUND (MASK) ×4 IMPLANT
FACESHIELD WRAPAROUND OR TEAM (MASK) ×1 IMPLANT
GAUZE SPONGE 2X2 8PLY STRL LF (GAUZE/BANDAGES/DRESSINGS) ×1 IMPLANT
GLOVE BIOGEL PI IND STRL 7.5 (GLOVE) ×1 IMPLANT
GLOVE BIOGEL PI IND STRL 8 (GLOVE) ×1 IMPLANT
GLOVE BIOGEL PI INDICATOR 7.5 (GLOVE) ×1
GLOVE BIOGEL PI INDICATOR 8 (GLOVE) ×1
GLOVE ECLIPSE 8.0 STRL XLNG CF (GLOVE) ×4 IMPLANT
GLOVE ORTHO TXT STRL SZ7.5 (GLOVE) ×2 IMPLANT
GLOVE SURG ENC MOIS LTX SZ6 (GLOVE) ×2 IMPLANT
GLOVE SURG UNDER POLY LF SZ6.5 (GLOVE) ×4 IMPLANT
GOWN BRE IMP SLV AUR LG STRL (GOWN DISPOSABLE) ×2 IMPLANT
GOWN STRL REIN 3XL XLG LVL4 (GOWN DISPOSABLE) ×2 IMPLANT
GOWN STRL REUS W/ TWL LRG LVL3 (GOWN DISPOSABLE) ×2 IMPLANT
GOWN STRL REUS W/TWL LRG LVL3 (GOWN DISPOSABLE) ×4
HEAD CERAMIC 36 PLUS5 (Hips) ×1 IMPLANT
KIT BASIN OR (CUSTOM PROCEDURE TRAY) ×2 IMPLANT
KIT TURNOVER KIT B (KITS) ×2 IMPLANT
MANIFOLD NEPTUNE II (INSTRUMENTS) ×2 IMPLANT
NS IRRIG 1000ML POUR BTL (IV SOLUTION) ×2 IMPLANT
PACK TOTAL JOINT (CUSTOM PROCEDURE TRAY) ×2 IMPLANT
PACK UNIVERSAL I (CUSTOM PROCEDURE TRAY) ×2 IMPLANT
PAD ARMBOARD 7.5X6 YLW CONV (MISCELLANEOUS) ×3 IMPLANT
PINNACLE ALTRX PLUS 4 N 36X56 (Hips) ×1 IMPLANT
PINNACLE SECTOR CUP 56MM (Hips) ×2 IMPLANT
RESTRAINT LIMB HOLDER UNIV (RESTRAINTS) ×2 IMPLANT
RETRACTOR WND ALEXIS 18 MED (MISCELLANEOUS) ×1 IMPLANT
RTRCTR WOUND ALEXIS 18CM MED (MISCELLANEOUS)
SCREW 6.5MMX30MM (Screw) ×1 IMPLANT
SPONGE GAUZE 2X2 STER 10/PKG (GAUZE/BANDAGES/DRESSINGS)
SPONGE T-LAP 4X18 ~~LOC~~+RFID (SPONGE) IMPLANT
STAPLER VISISTAT 35W (STAPLE) ×1 IMPLANT
STEM FEM ACTIS HIGH SZ7 (Stem) ×1 IMPLANT
SUCTION FRAZIER HANDLE 10FR (MISCELLANEOUS)
SUCTION TUBE FRAZIER 10FR DISP (MISCELLANEOUS) ×1 IMPLANT
SUT MNCRL AB 4-0 PS2 18 (SUTURE) ×2 IMPLANT
SUT VIC AB 1 CT1 27 (SUTURE) ×4
SUT VIC AB 1 CT1 27XBRD ANBCTR (SUTURE) ×4 IMPLANT
SUT VIC AB 2-0 CT1 27 (SUTURE) ×4
SUT VIC AB 2-0 CT1 TAPERPNT 27 (SUTURE) ×2 IMPLANT
SUT VLOC 180 0 24IN GS25 (SUTURE) ×2 IMPLANT
TOWEL GREEN STERILE (TOWEL DISPOSABLE) ×2 IMPLANT
TOWEL GREEN STERILE FF (TOWEL DISPOSABLE) ×2 IMPLANT
TRAY CATH 16FR W/PLASTIC CATH (SET/KITS/TRAYS/PACK) IMPLANT
TRAY FOLEY MTR SLVR 16FR STAT (SET/KITS/TRAYS/PACK) IMPLANT
WATER STERILE IRR 1000ML POUR (IV SOLUTION) ×6 IMPLANT

## 2022-03-22 NOTE — ED Notes (Signed)
Breakfast order placed ?

## 2022-03-22 NOTE — Anesthesia Procedure Notes (Signed)
Procedure Name: Intubation Date/Time: 03/22/2022 5:12 PM  Performed by: Eligha Bridegroom, CRNAPre-anesthesia Checklist: Patient identified, Emergency Drugs available, Suction available, Patient being monitored and Timeout performed Patient Re-evaluated:Patient Re-evaluated prior to induction Oxygen Delivery Method: Circle system utilized Preoxygenation: Pre-oxygenation with 100% oxygen Induction Type: IV induction Laryngoscope Size: Mac and 4 Grade View: Grade II Tube type: Oral Tube size: 7.5 mm Number of attempts: 1 Airway Equipment and Method: Stylet Placement Confirmation: ETT inserted through vocal cords under direct vision, positive ETCO2 and breath sounds checked- equal and bilateral Secured at: 22 cm Tube secured with: Tape Dental Injury: Teeth and Oropharynx as per pre-operative assessment

## 2022-03-22 NOTE — Transfer of Care (Signed)
Immediate Anesthesia Transfer of Care Note  Patient: Alex [name]  Procedure(s) Performed: ANTERIOR TOTAL HIP ARTHROPLASTY (Left: Hip)  Patient Location: PACU  Anesthesia Type:GA combined with regional for post-op pain  Level of Consciousness: drowsy  Airway & Oxygen Therapy: Patient Spontanous Breathing and Patient connected to face mask oxygen  Post-op Assessment: Report given to RN and Post -op Vital signs reviewed and stable  Post vital signs: Reviewed and stable  Last Vitals:  Vitals Value Taken Time  BP 154/103 03/22/22 1910  Temp    Pulse 107 03/22/22 1913  Resp 21 03/22/22 1914  SpO2 92 % 03/22/22 1913  Vitals shown include unvalidated device data.  Last Pain:  Vitals:   03/22/22 1503  TempSrc:   PainSc: 9          Complications: No notable events documented.

## 2022-03-22 NOTE — Consult Note (Signed)
Reason for Consult:left femoral neck fracture Referring Physician: Roel Cluck, MD  Alex [name] is an 71 y.o. male.  HPI: 71 yo male with history of bilateral hip osteoarthritis fell while at a restaurant yesterday.  He had immediate onset of pain requiring transportation to the ER.  Past Medical History:  Diagnosis Date   DVT (deep venous thrombosis) ([place])    Headache    Hypercholesterolemia    Hypertension     Past Surgical History:  Procedure Laterality Date   ANTERIOR CERVICAL DECOMP/DISCECTOMY FUSION N/A 11/28/2017   Procedure: ANTERIOR CERVICAL DECOMPRESSION/DISCECTOMY FUSION CERVICAL THREE- CERVICAL FOUR;  Surgeon: Jovita Gamma, MD;  Location: [facility];  Service: Neurosurgery;  Laterality: N/A;   ANTERIOR FUSION CLIVUS-C2 EXTRAORAL W/ ODONTOID EXCISION     SHOULDER ARTHROSCOPY     URINARY SURGERY     YRS AGO    Family History  Problem Relation Age of Onset   Breast cancer Mother    Colon cancer Neg Hx    Rectal cancer Neg Hx    Stomach cancer Neg Hx    Esophageal cancer Neg Hx     Social History:  reports that he has quit smoking. His smokeless tobacco use includes snuff. He reports current alcohol use. He reports that he does not use drugs.  Allergies:  Allergies  Allergen Reactions   Vancomycin Hives    Medications: I have reviewed the patient's current medications. Scheduled:  atorvastatin  20 mg Oral Daily   chlorhexidine  60 mL Topical Once   cyclobenzaprine  10 mg Oral QHS   finasteride  5 mg Oral Daily   povidone-iodine  2 Application Topical Once   tamsulosin  0.4 mg Oral QPC breakfast    Results for orders placed or performed during the hospital encounter of 03/21/22 (from the past 24 hour(s))  Basic metabolic panel     Status: Abnormal   Collection Time: 03/21/22  5:56 PM  Result Value Ref Range   Sodium 135 135 - 145 mmol/L   Potassium 4.2 3.5 - 5.1 mmol/L   Chloride 102 98 - 111 mmol/L   CO2 25 22 - 32 mmol/L   Glucose, Bld 155 (H) 70 - 99  mg/dL   BUN 15 8 - 23 mg/dL   Creatinine, Ser 1.17 0.61 - 1.24 mg/dL   Calcium 8.8 (L) 8.9 - 10.3 mg/dL   GFR, Estimated >60 >60 mL/min   Anion gap 8 5 - 15  CBC with Differential     Status: Abnormal   Collection Time: 03/21/22  5:56 PM  Result Value Ref Range   WBC 8.8 4.0 - 10.5 K/uL   RBC 5.24 4.22 - 5.81 MIL/uL   Hemoglobin 16.4 13.0 - 17.0 g/dL   HCT 47.5 39.0 - 52.0 %   MCV 90.6 80.0 - 100.0 fL   MCH 31.3 26.0 - 34.0 pg   MCHC 34.5 30.0 - 36.0 g/dL   RDW 13.8 11.5 - 15.5 %   Platelets 239 150 - 400 K/uL   nRBC 0.0 0.0 - 0.2 %   Neutrophils Relative % 71 %   Neutro Abs 6.3 1.7 - 7.7 K/uL   Lymphocytes Relative 18 %   Lymphs Abs 1.6 0.7 - 4.0 K/uL   Monocytes Relative 9 %   Monocytes Absolute 0.8 0.1 - 1.0 K/uL   Eosinophils Relative 1 %   Eosinophils Absolute 0.1 0.0 - 0.5 K/uL   Basophils Relative 0 %   Basophils Absolute 0.0 0.0 - 0.1 K/uL  Immature Granulocytes 1 %   Abs Immature Granulocytes 0.09 (H) 0.00 - 0.07 K/uL  Protime-INR     Status: None   Collection Time: 03/21/22  5:56 PM  Result Value Ref Range   Prothrombin Time 14.4 11.4 - 15.2 seconds   INR 1.1 0.8 - 1.2  Type and screen [place]     Status: None   Collection Time: 03/21/22  5:56 PM  Result Value Ref Range   ABO/RH(D) O POS    Antibody Screen NEG    Sample Expiration      03/24/2022,2359 Performed at [facility], [address], [place], [place] [address]   CK     Status: None   Collection Time: 03/21/22  5:56 PM  Result Value Ref Range   Total CK 50 49 - 397 U/L  TSH     Status: None   Collection Time: 03/21/22  5:56 PM  Result Value Ref Range   TSH 1.084 0.350 - 4.500 uIU/mL  Troponin I (High Sensitivity)     Status: None   Collection Time: 03/21/22  5:56 PM  Result Value Ref Range   Troponin I (High Sensitivity) 5 <18 ng/L  Hepatic function panel     Status: Abnormal   Collection Time: 03/21/22  5:56 PM  Result Value Ref Range   Total Protein 6.0 (L)  6.5 - 8.1 g/dL   Albumin 3.1 (L) 3.5 - 5.0 g/dL   AST 20 15 - 41 U/L   ALT 24 0 - 44 U/L   Alkaline Phosphatase 84 38 - 126 U/L   Total Bilirubin 0.8 0.3 - 1.2 mg/dL   Bilirubin, Direct 0.1 0.0 - 0.2 mg/dL   Indirect Bilirubin 0.7 0.3 - 0.9 mg/dL  Magnesium     Status: None   Collection Time: 03/21/22  5:56 PM  Result Value Ref Range   Magnesium 1.9 1.7 - 2.4 mg/dL  Phosphorus     Status: None   Collection Time: 03/21/22  5:56 PM  Result Value Ref Range   Phosphorus 2.6 2.5 - 4.6 mg/dL  Hemoglobin A1c     Status: None   Collection Time: 03/21/22  5:56 PM  Result Value Ref Range   Hgb A1c MFr Bld 5.4 4.8 - 5.6 %   Mean Plasma Glucose 108.28 mg/dL  Urinalysis, Complete w Microscopic Urine, Catheterized     Status: Abnormal   Collection Time: 03/22/22  1:26 AM  Result Value Ref Range   Color, Urine YELLOW YELLOW   APPearance CLEAR CLEAR   Specific Gravity, Urine 1.020 1.005 - 1.030   pH 6.0 5.0 - 8.0   Glucose, UA NEGATIVE NEGATIVE mg/dL   Hgb urine dipstick SMALL (A) NEGATIVE   Bilirubin Urine NEGATIVE NEGATIVE   Ketones, ur NEGATIVE NEGATIVE mg/dL   Protein, ur NEGATIVE NEGATIVE mg/dL   Nitrite NEGATIVE NEGATIVE   Leukocytes,Ua NEGATIVE NEGATIVE   RBC / HPF 21-50 0 - 5 RBC/hpf   WBC, UA 0-5 0 - 5 WBC/hpf   Bacteria, UA NONE SEEN NONE SEEN   Mucus PRESENT   Prealbumin     Status: None   Collection Time: 03/22/22  4:28 AM  Result Value Ref Range   Prealbumin 18 18 - 38 mg/dL  HIV Antibody (routine testing w rflx)     Status: None   Collection Time: 03/22/22  4:28 AM  Result Value Ref Range   HIV Screen 4th Generation wRfx Non Reactive Non Reactive  VITAMIN D 25  Hydroxy (Vit-D Deficiency, Fractures)     Status: Abnormal   Collection Time: 03/22/22  4:28 AM  Result Value Ref Range   Vit D, 25-Hydroxy 11.29 (L) 30 - 100 ng/mL  CBC     Status: None   Collection Time: 03/22/22  4:28 AM  Result Value Ref Range   WBC 10.0 4.0 - 10.5 K/uL   RBC 5.04 4.22 - 5.81 MIL/uL    Hemoglobin 15.6 13.0 - 17.0 g/dL   HCT 45.7 39.0 - 52.0 %   MCV 90.7 80.0 - 100.0 fL   MCH 31.0 26.0 - 34.0 pg   MCHC 34.1 30.0 - 36.0 g/dL   RDW 13.9 11.5 - 15.5 %   Platelets 203 150 - 400 K/uL   nRBC 0.0 0.0 - 0.2 %  Basic metabolic panel     Status: Abnormal   Collection Time: 03/22/22  4:28 AM  Result Value Ref Range   Sodium 134 (L) 135 - 145 mmol/L   Potassium 4.1 3.5 - 5.1 mmol/L   Chloride 100 98 - 111 mmol/L   CO2 27 22 - 32 mmol/L   Glucose, Bld 122 (H) 70 - 99 mg/dL   BUN 12 8 - 23 mg/dL   Creatinine, Ser 0.93 0.61 - 1.24 mg/dL   Calcium 8.8 (L) 8.9 - 10.3 mg/dL   GFR, Estimated >60 >60 mL/min   Anion gap 7 5 - 15    X-ray: CLINICAL DATA:  Trauma due to a fall.   EXAM: DG HIP (WITH OR WITHOUT PELVIS) 2-3V LEFT   COMPARISON:  None Available.   FINDINGS: Patient positioning limits examination. There appears to be a transverse fracture of the left femoral neck with varus angulation of the fracture fragment. Pelvis appears grossly intact. SI joints and symphysis pubis are not displaced. Soft tissues are unremarkable. Vascular calcifications.   IMPRESSION: Transverse fracture of the left femoral neck with varus angulation.     Electronically Signed   By: Lucienne Capers M.D.  ROS: As per HPI  Blood pressure (!) 156/100, pulse (!) 106, temperature 98.1 F (36.7 C), temperature source Oral, resp. rate 20, height '6\' 2"'$  (1.88 m), weight 103.4 kg, SpO2 96 %.  Physical Exam: Constitutional:  No weight loss, night sweats, Fevers, chills, fatigue, weight loss  HEENT:  No headaches, Difficulty swallowing,Tooth/dental problems,Sore throat,  No sneezing, itching, ear ache, nasal congestion, post nasal drip,  Cardio-vascular:  No chest pain, Orthopnea, PND, anasarca, dizziness, palpitations.no Bilateral lower extremity swelling  GI:  No heartburn, indigestion, abdominal pain, nausea, vomiting, diarrhea, change in bowel habits, loss of appetite, melena, blood in  stool, hematemesis Resp:  no shortness of breath at rest. No dyspnea on exertion, No excess mucus, no productive cough, No non-productive cough, No coughing up of blood.No change in color of mucus.No wheezing. Skin:  no rash or lesions. No jaundice GU:  no dysuria, change in color of urine, no urgency or frequency. No straining to urinate.  No flank pain.  Musculoskeletal:  LLE with shortening ER Pain with movement Psych:  No change in mood or affect. No depression or anxiety. No memory loss.  Neuro: no localizing neurological complaints, no tingling, no weakness, no double vision, no gait abnormality, no slurred speech, no confusion    Assessment/Plan: Left hip femoral neck fracture  Plan: Will plan to take him to the OR today for left THR NPO Post op orders to follow Will resume Xarelto post op  Mauri Pole 03/22/2022, 2:26 PM

## 2022-03-22 NOTE — Progress Notes (Signed)
  Echocardiogram 2D Echocardiogram has been performed.  Alex [name] 03/22/2022, 10:31 AM

## 2022-03-22 NOTE — Progress Notes (Addendum)
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 for procedure Postop weightbearing status, pain control, follow-up and anticoagulation as per Ortho Continue for now Robaxin 500 every 6 as needed Norco 1-2 every 6 as needed moderate pain and Dilaudid IV for severe pain Recurrent DVT/PE Xarelto held--NPO as possible surgery later today Patient has a preference for [facility] during the procedure and they will be made aware Urinary retention/BPH Developed retention on admission-resume Flomax 0.4, finasteride 5   DVT prophylaxis: SCD until surgery then resume Xarelto Code Status: Full Family Communication: None present Disposition:  Status is: Inpatient Remains inpatient appropriate because: Needs surgery   Consultants:  Ortho  Procedures: None  Antimicrobials: None   Subjective: Awake coherent no distress  quite hard of hearing-pain is moderate  Objective: Vitals:   03/22/22 0600 03/22/22 0630 03/22/22 0640 03/22/22 0700  BP: (!) 150/95 (!) 135/93  (!) 146/90  Pulse: (!) 107 (!) 101  100  Resp: (!) 28 (!) 22  (!) 22  Temp:   98.4 F (36.9 C)   TempSrc:   Oral   SpO2: 98% 97%  97%  Weight:      Height:        Intake/Output Summary (Last 24 hours) at 03/22/2022 0747 Last data filed at 03/22/2022 0343 Gross per 24 hour  Intake --  Output 650 ml  Net -650 ml   Filed Weights   03/21/22 1747  Weight: 103.4 kg    Examination:  EOMI NCAT no focal deficit no  wheeze no rales no rhonchi Chest is clear no added sound ROM intact heart but is able to left lower extremity pulses intact Abdomen is soft  Data Reviewed: personally reviewed   CBC    Component Value Date/Time   WBC 10.0 03/22/2022 0428   RBC 5.04 03/22/2022 0428   HGB 15.6 03/22/2022 0428   HCT 45.7 03/22/2022 0428   PLT 203 03/22/2022 0428   MCV 90.7 03/22/2022 0428   MCH 31.0 03/22/2022 0428   MCHC 34.1 03/22/2022 0428   RDW 13.9 03/22/2022 0428   LYMPHSABS 1.6 03/21/2022 1756   MONOABS 0.8 03/21/2022 1756   EOSABS 0.1 03/21/2022 1756   BASOSABS 0.0 03/21/2022 1756      Latest Ref Rng & Units 03/22/2022    4:28 AM 03/21/2022    5:56 PM 04/19/2019    7:31 AM  CMP  Glucose 70 - 99 mg/dL 122  155    BUN 8 - 23 mg/dL 12  15    Creatinine 0.61 - 1.24 mg/dL 0.93  1.17  0.83   Sodium 135 - 145 mmol/L 134  135    Potassium 3.5 - 5.1 mmol/L 4.1  4.2    Chloride 98 - 111 mmol/L 100  102    CO2 22 - 32 mmol/L 27  25    Calcium 8.9 - 10.3 mg/dL 8.8  8.8    Total Protein 6.5 - 8.1 g/dL  6.0    Total Bilirubin 0.3 - 1.2 mg/dL  0.8  Alkaline Phos 38 - 126 U/L  84    AST 15 - 41 U/L  20    ALT 0 - 44 U/L  24       Radiology Studies: DG CHEST PORT 1 VIEW  Result Date: 03/21/2022 CLINICAL DATA:  Preop chest x-ray.  Fall. EXAM: PORTABLE CHEST 1 VIEW COMPARISON:  None Available. FINDINGS: The heart size and mediastinal contours are within normal limits. There is atherosclerotic calcification of the aorta. No consolidation, effusion, or pneumothorax. Cervical spinal fusion hardware is noted. No acute osseous abnormality. IMPRESSION: No active disease. Electronically Signed   By: Brett Fairy M.D.   On: 03/21/2022 20:12   DG HIP UNILAT WITH PELVIS 2-3 VIEWS LEFT  Result Date: 03/21/2022 CLINICAL DATA:  Trauma due to a fall. EXAM: DG HIP (WITH OR WITHOUT PELVIS) 2-3V LEFT COMPARISON:  None Available. FINDINGS: Patient positioning limits examination. There appears to be a transverse  fracture of the left femoral neck with varus angulation of the fracture fragment. Pelvis appears grossly intact. SI joints and symphysis pubis are not displaced. Soft tissues are unremarkable. Vascular calcifications. IMPRESSION: Transverse fracture of the left femoral neck with varus angulation. Electronically Signed   By: Lucienne Capers M.D.   On: 03/21/2022 18:08     Scheduled Meds:  atorvastatin  20 mg Oral Daily   Continuous Infusions:  methocarbamol (ROBAXIN) IV       LOS: 1 day   Time spent: [place], MD [facility] To contact the attending provider between 7A-7P or the covering provider during after hours 7P-7A, please log into the web site [email or web address] and access using universal [place] password for that web site. If you do not have the password, please call the hospital operator.  03/22/2022, 7:47 AM

## 2022-03-22 NOTE — Anesthesia Preprocedure Evaluation (Addendum)
Anesthesia Evaluation  Patient identified by MRN, date of birth, ID band Patient awake    Reviewed: Allergy & Precautions, Patient's Chart, lab work & pertinent test results  Airway Mallampati: II  TM Distance: >3 FB Neck ROM: Full    Dental no notable dental hx.    Pulmonary former smoker,    Pulmonary exam normal        Cardiovascular hypertension, Pt. on medications  Rhythm:Regular Rate:Normal     Neuro/Psych  Headaches,    GI/Hepatic   Endo/Other    Renal/GU      Musculoskeletal   Abdominal Normal abdominal exam  (+)   Peds  Hematology   Anesthesia Other Findings   Reproductive/Obstetrics                            Anesthesia Physical Anesthesia Plan  ASA: 2  Anesthesia Plan: Regional   Post-op Pain Management:    Induction:   PONV Risk Score and Plan: 0  Airway Management Planned:   Additional Equipment:   Intra-op Plan:   Post-operative Plan:   Informed Consent: I have reviewed the patients History and Physical, chart, labs and discussed the procedure including the risks, benefits and alternatives for the proposed anesthesia with the patient or authorized representative who has indicated his/her understanding and acceptance.       Plan Discussed with:   Anesthesia Plan Comments: (Lab Results      Component                Value               Date                      WBC                      10.0                03/22/2022                HGB                      15.6                03/22/2022                HCT                      45.7                03/22/2022                MCV                      90.7                03/22/2022                PLT                      203                 03/22/2022           )       Anesthesia Quick Evaluation

## 2022-03-22 NOTE — Assessment & Plan Note (Signed)
Noted to have urinary retention Patient unable to urinate bladder scan 500 Place Foley catheter may need to follow-up with urology

## 2022-03-22 NOTE — Anesthesia Preprocedure Evaluation (Signed)
Anesthesia Evaluation  Patient identified by MRN, date of birth, ID band Patient awake    Reviewed: Allergy & Precautions, H&P , NPO status , Patient's Chart, lab work & pertinent test results  Airway Mallampati: III   Neck ROM: limited    Dental   Pulmonary former smoker,    breath sounds clear to auscultation       Cardiovascular hypertension, + DVT   Rhythm:regular Rate:Normal  Takes Xarelto   Neuro/Psych  Headaches,    GI/Hepatic   Endo/Other    Renal/GU      Musculoskeletal   Abdominal   Peds  Hematology   Anesthesia Other Findings   Reproductive/Obstetrics                             Anesthesia Physical Anesthesia Plan  ASA: 3  Anesthesia Plan: General   Post-op Pain Management:    Induction: Intravenous  PONV Risk Score and Plan: 2 and Ondansetron, Dexamethasone, Treatment may vary due to age or medical condition and Midazolam  Airway Management Planned: Oral ETT  Additional Equipment:   Intra-op Plan:   Post-operative Plan: Extubation in OR  Informed Consent: I have reviewed the patients History and Physical, chart, labs and discussed the procedure including the risks, benefits and alternatives for the proposed anesthesia with the patient or authorized representative who has indicated his/her understanding and acceptance.     Dental advisory given  Plan Discussed with: CRNA, Anesthesiologist and Surgeon  Anesthesia Plan Comments:         Anesthesia Quick Evaluation

## 2022-03-22 NOTE — Plan of Care (Signed)

## 2022-03-22 NOTE — ED Notes (Signed)
Tried to get patient to urinate, patient states he still can't pee. Bladder scanner showed 573m of urine in bladder. Dr. DRoel Cluckmade aware and order given to place foley cath.

## 2022-03-22 NOTE — ED Notes (Signed)
Pt resting comfortably at this time. Visible rise and fall of chest noted. Pt appears in NAD.  

## 2022-03-22 NOTE — Op Note (Signed)
NAME:  Alex SCHWAGER.: [id number]      MEDICAL RECORD NO.: [id number]      FACILITY:  [facility]      PHYSICIAN:  Mauri Pole  DATE OF BIRTH:  March 16, 1951     DATE OF PROCEDURE:  03/22/2022                                 OPERATIVE REPORT         PREOPERATIVE DIAGNOSIS: Left  hip osteoarthritis.      POSTOPERATIVE DIAGNOSIS:  Left hip osteoarthritis.      PROCEDURE:  Left total hip replacement through an anterior approach   utilizing DePuy THR system, component size 56 mm pinnacle cup, a size 36+4 neutral   Altrex liner, a size 7 Hi Actis stem with a 36+5 delta ceramic   ball.      SURGEON:  Pietro Cassis. Alvan Dame, M.D.      ASSISTANT:  Costella Hatcher, PA-C     ANESTHESIA:  General.      SPECIMENS:  None.      COMPLICATIONS:  None.      BLOOD LOSS:  1000 cc     DRAINS:  None.      INDICATION OF THE PROCEDURE:  Alex [name] is a 71 y.o. male who presented to the emergency room after a ground level fall at [facility].  He had immediate onset of left hip pain and inability to bear weight.  Radiographs revealed a displaced femoral neck fracture.  I reviewed with him and his daughter the indications to manage his hip fracture in the setting of left hip osteoarthritis.Consent was obtained for   benefit of pain relief.  Specific risks of infection, DVT, component   failure, dislocation, neurovascular injury, and need for revision surgery were reviewed in the office.Marland Kitchen     PROCEDURE IN DETAIL:  The patient was brought to operative theater.   Once adequate anesthesia, preoperative antibiotics, 2 gm of Ancef, 1 gm of Tranexamic Acid, and 10 mg of Decadron were administered, the patient was positioned supine on the [employer] table.  Once the patient was safely positioned with adequate padding of boney prominences we predraped out the hip, and used fluoroscopy to confirm orientation of the pelvis.      The left hip was then prepped and draped  from proximal iliac crest to   mid thigh with a shower curtain technique.      Time-out was performed identifying the patient, planned procedure, and the appropriate extremity.     An incision was then made 2 cm lateral to the   anterior superior iliac spine extending over the orientation of the   tensor fascia lata muscle and sharp dissection was carried down to the   fascia of the muscle.      The fascia was then incised.  The muscle belly was identified and swept   laterally and retractor placed along the superior neck.  Following   cauterization of the circumflex vessels and removing some pericapsular   fat, a second cobra retractor was placed on the inferior neck.  A T-capsulotomy was made along the line of the   superior neck to the trochanteric fossa, then extended proximally and   distally.  Tag sutures were placed and the retractors were  then placed   intracapsular.  We then identified the trochanteric fossa and the femoral neck fracture.  A neck cut was made with the femur on traction.  The femoral neck fractured segment and head were removed without difficulty or complication.  Traction was let   off and retractors were placed posterior and anterior around the   acetabulum.      The labrum and foveal tissue were debrided.  I began reaming with a 47 mm   reamer and reamed up to 55 mm reamer with good bony bed preparation and a 56 mm  cup was chosen.  The final 56 mm Pinnacle cup was then impacted under fluoroscopy to confirm the depth of penetration and orientation with respect to   Abduction and forward flexion.  A screw was placed into the ilium followed by the hole eliminator.  The final   36+4 neutral Altrex liner was impacted with good visualized rim fit.  The cup was positioned anatomically within the acetabular portion of the pelvis.      At this point, the femur was rolled to 100 degrees.  Further capsule was   released off the inferior aspect of the femoral neck.  I then    released the superior capsule proximally.  With the leg in a neutral position the hook was placed laterally   along the femur under the vastus lateralis origin and elevated manually and then held in position using the hook attachment on the bed.  The leg was then extended and adducted with the leg rolled to 100   degrees of external rotation.  Retractors were placed along the medial calcar and posteriorly over the greater trochanter.  Once the proximal femur was fully   exposed, I used a box osteotome to set orientation.  I then began   broaching with the starting chili pepper broach and passed this by hand and then broached up to 7.  With the 7 broach in place I chose a high offset neck and did several trial reductions.  The offset was appropriate, leg lengths   appeared to be equal best matched with the +5 head ball trial confirmed radiographically.   Given these findings, I went ahead and dislocated the hip, repositioned all   retractors and positioned the right hip in the extended and abducted position.  The final 7 Hi Actis stem was   chosen and it was impacted down to the level of neck cut.  Based on this   and the trial reductions, a final 36+5 delta ceramic ball was chosen and   impacted onto a clean and dry trunnion, and the hip was reduced.  The   hip had been irrigated throughout the case again at this point.  I did   reapproximate the superior capsular leaflet to the anterior leaflet   using #1 Vicryl.  The fascia of the   tensor fascia lata muscle was then reapproximated using #1 Vicryl and #0 Stratafix sutures.  The   remaining wound was closed with 2-0 Vicryl and running 4-0 Monocryl.   The hip was cleaned, dried, and dressed sterilely using Dermabond and   Aquacel dressing.  The patient was then brought   to recovery room in stable condition tolerating the procedure well.    Costella Hatcher, PA-C was present for the entirety of the case involved from   preoperative  positioning, perioperative retractor management, general   facilitation of the case, as well as primary wound closure as assistant.  Pietro Cassis Alvan Dame, M.D.        03/22/2022 4:36 PM

## 2022-03-22 NOTE — Anesthesia Procedure Notes (Addendum)
Anesthesia Regional Block: Femoral nerve block   Pre-Anesthetic Checklist: , timeout performed,  Correct Patient, Correct Site, Correct Laterality,  Correct Procedure, Correct Position, site marked,  Risks and benefits discussed,  Surgical consent,  Pre-op evaluation,  At surgeon's request and post-op pain management  Laterality: Left  Prep: chloraprep       Needles:  Injection technique: Single-shot  Needle Type: Echogenic Stimulator Needle     Needle Length: 9cm  Needle Gauge: 21     Additional Needles:   Procedures:,,,, ultrasound used (permanent image in chart),,    Narrative:  Start time: 03/22/2022 11:05 AM End time: 03/22/2022 11:10 AM Injection made incrementally with aspirations every 5 mL.  Performed by: Personally  Anesthesiologist: Effie Berkshire, MD  Additional Notes: Patient tolerated the procedure well. Local anesthetic introduced in an incremental fashion under minimal resistance after negative aspirations. No paresthesias were elicited. After completion of the procedure, no acute issues were identified and patient continued to be monitored by RN.

## 2022-03-22 NOTE — Discharge Instructions (Signed)

## 2022-03-22 NOTE — ED Notes (Signed)
Patient resting in bed, foley cath draining to gravity, cardiac monitor in place. NAD noted. Call light in reach.

## 2022-03-22 NOTE — Anesthesia Postprocedure Evaluation (Signed)
Anesthesia Post Note  Patient: Alex [name]  Procedure(s) Performed: AN AD [place]     Patient location during evaluation: PACU Anesthesia Type: Regional Level of consciousness: awake Pain management: pain level controlled Vital Signs Assessment: post-procedure vital signs reviewed and stable Cardiovascular status: blood pressure returned to baseline Anesthetic complications: no   No notable events documented.  Last Vitals:  Vitals:   03/22/22 1030 03/22/22 1050  BP: (!) 144/99 (!) 164/96  Pulse: (!) 103 (!) 106  Resp: (!) 25 (!) 26  Temp:    SpO2: 96% 94%    Last Pain:  Vitals:   03/22/22 1032  TempSrc:   PainSc: [place] Alishah Schulte

## 2022-03-23 ENCOUNTER — Encounter (HOSPITAL_COMMUNITY): Payer: Self-pay | Admitting: Orthopedic Surgery

## 2022-03-23 DIAGNOSIS — S72002A Fracture of unspecified part of neck of left femur, initial encounter for closed fracture: Secondary | ICD-10-CM | POA: Diagnosis not present

## 2022-03-23 LAB — CBC WITH DIFFERENTIAL/PLATELET
Abs Immature Granulocytes: 0.09 10*3/uL — ABNORMAL HIGH (ref 0.00–0.07)
Basophils Absolute: 0 10*3/uL (ref 0.0–0.1)
Basophils Relative: 0 %
Eosinophils Absolute: 0 10*3/uL (ref 0.0–0.5)
Eosinophils Relative: 0 %
HCT: 39.8 % (ref 39.0–52.0)
Hemoglobin: 13.7 g/dL (ref 13.0–17.0)
Immature Granulocytes: 1 %
Lymphocytes Relative: 6 %
Lymphs Abs: 0.7 10*3/uL (ref 0.7–4.0)
MCH: 30.7 pg (ref 26.0–34.0)
MCHC: 34.4 g/dL (ref 30.0–36.0)
MCV: 89.2 fL (ref 80.0–100.0)
Monocytes Absolute: 1 10*3/uL (ref 0.1–1.0)
Monocytes Relative: 8 %
Neutro Abs: 10.2 10*3/uL — ABNORMAL HIGH (ref 1.7–7.7)
Neutrophils Relative %: 85 %
Platelets: 170 10*3/uL (ref 150–400)
RBC: 4.46 MIL/uL (ref 4.22–5.81)
RDW: 13.9 % (ref 11.5–15.5)
WBC: 12 10*3/uL — ABNORMAL HIGH (ref 4.0–10.5)
nRBC: 0 % (ref 0.0–0.2)

## 2022-03-23 LAB — BASIC METABOLIC PANEL
Anion gap: 10 (ref 5–15)
BUN: 12 mg/dL (ref 8–23)
CO2: 22 mmol/L (ref 22–32)
Calcium: 8.4 mg/dL — ABNORMAL LOW (ref 8.9–10.3)
Chloride: 99 mmol/L (ref 98–111)
Creatinine, Ser: 0.81 mg/dL (ref 0.61–1.24)
GFR, Estimated: >60 mL/min (ref >60)
Glucose, Bld: 139 mg/dL — ABNORMAL HIGH (ref 70–99)
Potassium: 4.6 mmol/L (ref 3.5–5.1)
Sodium: 131 mmol/L — ABNORMAL LOW (ref 135–145)

## 2022-03-23 MED ORDER — POLYETHYLENE GLYCOL 3350 17 G PO PACK: 17.0000 g | PACK | Freq: Every day | ORAL | Status: DC | PRN

## 2022-03-23 MED ORDER — DEXAMETHASONE SODIUM PHOSPHATE 10 MG/ML IJ SOLN
10.0000 mg | Freq: Once | INTRAMUSCULAR | Status: AC
  Administered 2022-03-23: 10 mg via INTRAVENOUS
  Filled 2022-03-23: qty 1

## 2022-03-23 MED ORDER — SODIUM CHLORIDE 0.9 % IV SOLN
INTRAVENOUS | Status: DC
Start: 2022-03-23 — End: 2022-03-29

## 2022-03-23 MED ORDER — DIPHENHYDRAMINE HCL 12.5 MG/5ML PO ELIX: 12.5000 mg | ORAL_SOLUTION | ORAL | Status: DC | PRN

## 2022-03-23 MED ORDER — BISACODYL 10 MG RE SUPP: 10.0000 mg | Freq: Every day | RECTAL | Status: DC | PRN

## 2022-03-23 MED ORDER — DOCUSATE SODIUM 100 MG PO CAPS
100.0000 mg | ORAL_CAPSULE | Freq: Two times a day (BID) | ORAL | Status: DC
  Administered 2022-03-23 – 2022-03-29 (×14): 100 mg via ORAL
  Filled 2022-03-23 (×14): qty 1

## 2022-03-23 MED ORDER — PHENOL 1.4 % MT LIQD: 1.0000 | OROMUCOSAL | Status: DC | PRN

## 2022-03-23 MED ORDER — MENTHOL 3 MG MT LOZG
1.0000 | LOZENGE | OROMUCOSAL | Status: DC | PRN
Start: 2022-03-23 — End: 2022-03-29

## 2022-03-23 MED ORDER — FERROUS SULFATE 325 (65 FE) MG PO TABS
325.0000 mg | ORAL_TABLET | Freq: Three times a day (TID) | ORAL | Status: DC
  Administered 2022-03-23 – 2022-03-29 (×19): 325 mg via ORAL
  Filled 2022-03-23 (×17): qty 1

## 2022-03-23 MED ORDER — HYDROMORPHONE HCL 1 MG/ML IJ SOLN
0.5000 mg | INTRAMUSCULAR | Status: DC | PRN
  Administered 2022-03-23: 0.5 mg via INTRAVENOUS
  Filled 2022-03-23: qty 0.5

## 2022-03-23 MED ORDER — RIVAROXABAN 10 MG PO TABS
10.0000 mg | ORAL_TABLET | Freq: Every day | ORAL | Status: DC
  Administered 2022-03-23 – 2022-03-29 (×7): 10 mg via ORAL
  Filled 2022-03-23 (×7): qty 1

## 2022-03-23 MED ORDER — ASPIRIN 81 MG PO CHEW
81.0000 mg | CHEWABLE_TABLET | Freq: Two times a day (BID) | ORAL | Status: DC
  Administered 2022-03-23 (×2): 81 mg via ORAL
  Filled 2022-03-23 (×2): qty 1

## 2022-03-23 NOTE — TOC Initial Note (Signed)
Transition of Care [facility]) - Initial/Assessment Note    Patient Details  Name: Alex [name] MRN: [id number] Date of Birth: [birth year]  Transition of Care [facility] CM/SW Contact:    Joanne Chars, LCSW Phone Number: 03/23/2022, 4:04 PM  Clinical Narrative:  CSW spoke with pt regarding recommendation for SNF.  Pt agreeable to this.  Pt lives alone in [place], only daughter lives in [place].  Pt wants to discuss [place] vs [place] SNF locations with daughter.  Permission given to send out referral in hub.  Permission given to speak with daughter [place].  Pt is vaccinated for covid with one booster.                  Expected Discharge Plan: Skilled Nursing Facility Barriers to Discharge: Continued Medical Work up, SNF Pending bed offer   Patient Goals and CMS Choice Patient states their goals for this hospitalization and ongoing recovery are:: "get back to work"   Choice offered to / list presented to : Patient (pt wants to discuss with daughter)  Expected Discharge Plan and Services Expected Discharge Plan: [place] In-house Referral: Clinical Social Work   Post Acute Care Choice: [place] Living arrangements for the past 2 months: [place]                                      Prior Living Arrangements/Services Living arrangements for the past 2 months: Single Family Home Lives with:: Self Patient language and need for interpreter reviewed:: Yes        Need for Family Participation in Patient Care: Yes (Comment) Care giver support system in place?: Yes (comment) Current home services: Other (comment) (none) Criminal Activity/Legal Involvement Pertinent to Current Situation/Hospitalization: No - Comment as needed  Activities of Daily Living Home Assistive Devices/Equipment: Eyeglasses, Hearing aid, Walker (specify type) ADL Screening (condition at time of admission) Patient's cognitive ability adequate to safely  complete daily activities?: Yes Is the patient deaf or have difficulty hearing?: No Does the patient have difficulty seeing, even when wearing glasses/contacts?: No Does the patient have difficulty concentrating, remembering, or making decisions?: No Patient able to express need for assistance with ADLs?: Yes Does the patient have difficulty dressing or bathing?: No Independently performs ADLs?: Yes (appropriate for developmental age) Does the patient have difficulty walking or climbing stairs?: Yes Weakness of Legs: Both Weakness of Arms/Hands: None  Permission Sought/Granted Permission sought to share information with : Family Supports Permission granted to share information with : Yes, Verbal Permission Granted  Share Information with NAME: daughter Rojelio Brenner  Permission granted to share info w AGENCY: SNF        Emotional Assessment Appearance:: Appears stated age Attitude/Demeanor/Rapport: Engaged Affect (typically observed): Appropriate, Pleasant Orientation: : Oriented to Self, Oriented to Place, Oriented to  Time, Oriented to Situation Alcohol / Substance Use: Not Applicable Psych Involvement: No (comment)  Admission diagnosis:  Closed left hip fracture ([place]) [S72.002A] Closed left hip fracture, initial encounter ([place]) [S72.002A] Patient Active Problem List   Diagnosis Date Noted   Acute urinary retention 03/22/2022   S/P total left hip arthroplasty 03/22/2022   Closed left hip fracture ([place]) 03/21/2022   Hyperlipidemia 03/21/2022   HNP (herniated nucleus pulposus), cervical 11/28/2017   Personal history of DVT (deep vein thrombosis) 05/14/2017   Essential hypertension 05/14/2017   Cellulitis of left leg 05/14/2017   PCP:  Ardeth Perfect,  Nicki Reaper, MD Pharmacy:   [facility] [place], [address] [place] [address] Phone: [phone] Fax: [phone]     Social Determinants of Health  (SDOH) Interventions    Readmission Risk Interventions     No data to display

## 2022-03-23 NOTE — Progress Notes (Signed)
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 Alvan Dame 7/24 Await therapy eval-really wants to go home We will resume Xarelto for anticoagulation Continue for now Robaxin 500 every 6 as needed Norco 1-2 every 6 as needed moderate pain and Dilaudid IV for severe pain Recurrent DVT/PE Xarelto held initially resumed postprocedure Patient has a preference for [facility] during the procedure and they will be made aware Sinus tachycardia Probably elevated heart rate secondary to pain response Will start Toprol-XL 12.5 >lisinopril (held) and see how he does Urinary retention/BPH Developed retention on admission-resume Flomax 0.4, finasteride 5   DVT prophylaxis: SCD until surgery then resume Xarelto Code Status: Full Family Communication: None present Disposition:  Status is: Inpatient Remains inpatient appropriate because: Needs evaluation.  From therapy services   Consultants:  Ortho  Procedures: None  Antimicrobials: None   Subjective:  No fever no distress Notified of tachycardia but this may be related to pain  Objective: Vitals:   03/22/22 2100 03/22/22 2350 03/23/22 0400 03/23/22 0732  BP: 138/73 116/76 113/79 136/81  Pulse: (!) 107 94 93 (!) 109  Resp: '18 20 16   '$ Temp: 98 F (36.7 C) 98 F (36.7 C) 98.1 F (36.7 C) 99.4 F (37.4 C)  TempSrc: Oral Oral Oral Oral  SpO2: 96% 97% 100% 98%  Weight:      Height:        Intake/Output Summary (Last 24 hours) at 03/23/2022  1016 Last data filed at 03/23/2022 0656 Gross per 24 hour  Intake 1420.33 ml  Output 2675 ml  Net -1254.67 ml    Filed Weights   03/21/22 1747 03/22/22 1446  Weight: 103.4 kg 103.4 kg    Examination:  Pleasant coherent no distress EOMI NCAT no focal deficit CTA B no added sound no rales no rhonchi no wheeze ROM intact Power is 5/5--this is limited by pain in lower extremity   Data Reviewed: personally reviewed   CBC    Component Value Date/Time   WBC 12.0 (H) 03/23/2022 0338   RBC 4.46 03/23/2022 0338   HGB 13.7 03/23/2022 0338   HCT 39.8 03/23/2022 0338   PLT 170 03/23/2022 0338   MCV 89.2 03/23/2022 0338   MCH 30.7 03/23/2022 0338   MCHC 34.4 03/23/2022 0338   RDW 13.9 03/23/2022 0338   LYMPHSABS 0.7 03/23/2022 0338   MONOABS 1.0 03/23/2022 0338   EOSABS 0.0 03/23/2022 0338   BASOSABS 0.0 03/23/2022 0338      Latest Ref Rng & Units 03/23/2022    3:38 AM 03/22/2022    4:28 AM 03/21/2022    5:56 PM  CMP  Glucose 70 - 99 mg/dL 139  122  155   BUN 8 - 23 mg/dL '12  12  15   '$ Creatinine 0.61 - 1.24 mg/dL 0.81  0.93  1.17   Sodium 135 - 145 mmol/L 131  134  135   Potassium 3.5 - 5.1 mmol/L 4.6  4.1  4.2   Chloride 98 - 111 mmol/L 99  100  102  CO2 22 - 32 mmol/L '22  27  25   '$ Calcium 8.9 - 10.3 mg/dL 8.4  8.8  8.8   Total Protein 6.5 - 8.1 g/dL   6.0   Total Bilirubin 0.3 - 1.2 mg/dL   0.8   Alkaline Phos 38 - 126 U/L   84   AST 15 - 41 U/L   20   ALT 0 - 44 U/L   24      Radiology Studies: DG Pelvis Portable  Result Date: 03/22/2022 CLINICAL DATA:  [id number]. Post op films for left total hip arthroplasty EXAM: PORTABLE PELVIS 1-2 VIEWS COMPARISON:  None Available. FINDINGS: Total left hip arthroplasty. There is no evidence of pelvic fracture or diastasis. At least mild degenerative changes of the right hip. No pelvic bone lesions are seen. Subcutaneus soft tissue edema and emphysema overlying the left hip consistent with postsurgical changes. IMPRESSION: Status  post total left hip arthroplasty. Electronically Signed   By: Iven Finn M.D.   On: 03/22/2022 19:43   DG HIP UNILAT WITH PELVIS 1V LEFT  Result Date: 03/22/2022 CLINICAL DATA:  Anterior total hip arthroplasty EXAM: DG HIP (WITH OR WITHOUT PELVIS) 1V*L* COMPARISON:  03/21/2022 FINDINGS: Intraoperative fluoroscopy is obtained for surgical control purposes. Fluoroscopy time is recorded at 16 seconds. Dose 2.73 mGy. A single spot fluoroscopic images provided. Spot fluoroscopic image demonstrates placement of a left hip arthroplasty, incompletely included within the field of view. IMPRESSION: Intraoperative fluoroscopy obtained for surgical control purposes during placement of left hip arthroplasty. Electronically Signed   By: Lucienne Capers M.D.   On: 03/22/2022 19:08   DG C-Arm 1-60 Min-No Report  Result Date: 03/22/2022 Fluoroscopy was utilized by the requesting physician.  No radiographic interpretation.   ECHOCARDIOGRAM COMPLETE  Result Date: 03/22/2022    ECHOCARDIOGRAM REPORT   Patient Name:   Alex [name] Date of Exam: 03/22/2022 Medical Rec #:  [id number]     Height:       74.0 in Accession #:    [id number]    Weight:       228.0 lb Date of Birth:  1950-09-06      BSA:          2.298 m Patient Age:    55 years      BP:           150/96 mmHg Patient Gender: M             HR:           102 bpm. Exam Location:  Inpatient Procedure: 2D Echo, Cardiac Doppler, Color Doppler and Intracardiac            Opacification Agent Indications:    R94.31 Abnormal EKG  History:        Patient has no prior history of Echocardiogram examinations.                 Risk Factors:Hypertension.  Sonographer:    Bernadene Person RDCS Referring Phys: [id number] ANASTASSIA DOUTOVA  Sonographer Comments: Technically difficult study due to poor echo windows. Image acquisition challenging due to respiratory motion. Pt laying on back for echo, unable to turn on side. IMPRESSIONS  1. Limited images patient unable to turn on side.  2.  Left ventricular ejection fraction, by estimation, is 60 to 65%. The left ventricle has normal function. The left ventricle has no regional wall motion abnormalities. Left ventricular diastolic parameters are indeterminate.  3. Right ventricular systolic function is normal. The right  ventricular size is normal.  4. The mitral valve is normal in structure. No evidence of mitral valve regurgitation. No evidence of mitral stenosis.  5. The aortic valve is normal in structure. Aortic valve regurgitation is not visualized. No aortic stenosis is present.  6. The inferior vena cava is normal in size with greater than 50% respiratory variability, suggesting right atrial pressure of 3 mmHg. FINDINGS  Left Ventricle: Left ventricular ejection fraction, by estimation, is 60 to 65%. The left ventricle has normal function. The left ventricle has no regional wall motion abnormalities. Definity contrast agent was given IV to delineate the left ventricular  endocardial borders. The left ventricular internal cavity size was normal in size. There is no left ventricular hypertrophy. Left ventricular diastolic parameters are indeterminate. Right Ventricle: The right ventricular size is normal. No increase in right ventricular wall thickness. Right ventricular systolic function is normal. Left Atrium: Left atrial size was normal in size. Right Atrium: Right atrial size was normal in size. Pericardium: There is no evidence of pericardial effusion. Mitral Valve: The mitral valve is normal in structure. No evidence of mitral valve regurgitation. No evidence of mitral valve stenosis. Tricuspid Valve: The tricuspid valve is normal in structure. Tricuspid valve regurgitation is not demonstrated. No evidence of tricuspid stenosis. Aortic Valve: The aortic valve is normal in structure. Aortic valve regurgitation is not visualized. No aortic stenosis is present. Pulmonic Valve: The pulmonic valve was normal in structure. Pulmonic valve  regurgitation is not visualized. No evidence of pulmonic stenosis. Aorta: The aortic root is normal in size and structure. Venous: The inferior vena cava is normal in size with greater than 50% respiratory variability, suggesting right atrial pressure of 3 mmHg. IAS/Shunts: No atrial level shunt detected by color flow Doppler. Additional Comments: Limited images patient unable to turn on side.  LEFT VENTRICLE PLAX 2D LVIDd:         4.30 cm   Diastology LVIDs:         3.50 cm   LV e' medial:    7.38 cm/s LV PW:         0.90 cm   LV E/e' medial:  7.4 LV IVS:        1.00 cm   LV e' lateral:   10.10 cm/s LVOT diam:     2.10 cm   LV E/e' lateral: 5.4 LV SV:         59 LV SV Index:   26 LVOT Area:     3.46 cm  RIGHT VENTRICLE RV S prime:     13.60 cm/s TAPSE (M-mode): 1.8 cm LEFT ATRIUM             Index        RIGHT ATRIUM           Index LA diam:        3.80 cm 1.65 cm/m   RA Area:     12.00 cm LA Vol (A2C):   43.3 ml 18.84 ml/m  RA Volume:   24.40 ml  10.62 ml/m LA Vol (A4C):   46.1 ml 20.06 ml/m LA Biplane Vol: 45.6 ml 19.84 ml/m  AORTIC VALVE LVOT Vmax:   114.00 cm/s LVOT Vmean:  72.600 cm/s LVOT VTI:    0.170 m  AORTA Ao Root diam: 3.00 cm Ao Asc diam:  3.60 cm MITRAL VALVE MV Area (PHT): 4.49 cm    SHUNTS MV Decel Time: 169 msec    Systemic VTI:  0.17 m MV E  velocity: 54.50 cm/s  Systemic Diam: 2.10 cm MV A velocity: 69.20 cm/s MV E/A ratio:  0.79 Jenkins Rouge MD Electronically signed by Jenkins Rouge MD Signature Date/Time: 03/22/2022/10:33:36 AM    Final    DG CHEST PORT 1 VIEW  Result Date: 03/21/2022 CLINICAL DATA:  Preop chest x-ray.  Fall. EXAM: PORTABLE CHEST 1 VIEW COMPARISON:  None Available. FINDINGS: The heart size and mediastinal contours are within normal limits. There is atherosclerotic calcification of the aorta. No consolidation, effusion, or pneumothorax. Cervical spinal fusion hardware is noted. No acute osseous abnormality. IMPRESSION: No active disease. Electronically Signed   By:  Brett Fairy M.D.   On: 03/21/2022 20:12   DG HIP UNILAT WITH PELVIS 2-3 VIEWS LEFT  Result Date: 03/21/2022 CLINICAL DATA:  Trauma due to a fall. EXAM: DG HIP (WITH OR WITHOUT PELVIS) 2-3V LEFT COMPARISON:  None Available. FINDINGS: Patient positioning limits examination. There appears to be a transverse fracture of the left femoral neck with varus angulation of the fracture fragment. Pelvis appears grossly intact. SI joints and symphysis pubis are not displaced. Soft tissues are unremarkable. Vascular calcifications. IMPRESSION: Transverse fracture of the left femoral neck with varus angulation. Electronically Signed   By: Lucienne Capers M.D.   On: 03/21/2022 18:08     Scheduled Meds:  aspirin  81 mg Oral BID   atorvastatin  20 mg Oral Daily   Chlorhexidine Gluconate Cloth  6 each Topical Daily   docusate sodium  100 mg Oral BID   ferrous sulfate  325 mg Oral TID PC   finasteride  5 mg Oral Daily   tamsulosin  0.4 mg Oral QPC breakfast   Continuous Infusions:  sodium chloride     methocarbamol (ROBAXIN) IV       LOS: 2 days   Time spent: Big[place], MD [facility] To contact the attending provider between 7A-7P or the covering provider during after hours 7P-7A, please log into the web site [email or web address] and access using universal [place] password for that web site. If you do not have the password, please call the hospital operator.  03/23/2022, 10:16 AM

## 2022-03-23 NOTE — NC FL2 (Signed)
[place] LEVEL OF CARE SCREENING TOOL     IDENTIFICATION  Patient Name: Alex [name] Birthdate: 1950/12/09 Sex: male Admission Date (Current Location): 03/21/2022  [facility] and [place] Number:  Herbalist and Address:  [facility], [place] [address] [place], [place] [address]      Provider Number: [id number]  Attending Physician Name and Address:  Nita Sells, MD  Relative Name and Phone Number:  Concepcion, Kirkpatrick Daughter [phone]    Current Level of Care: Hospital Recommended Level of Care: [place] Prior Approval Number:    Date Approved/Denied:   PASRR Number: [id number]  Discharge Plan: SNF    Current Diagnoses: Patient Active Problem List   Diagnosis Date Noted   Acute urinary retention 03/22/2022   S/P total left hip arthroplasty 03/22/2022   Closed left hip fracture ([place]) 03/21/2022   Hyperlipidemia 03/21/2022   HNP (herniated nucleus pulposus), cervical 11/28/2017   Personal history of DVT (deep vein thrombosis) 05/14/2017   Essential hypertension 05/14/2017   Cellulitis of left leg 05/14/2017    Orientation RESPIRATION BLADDER Height & Weight     Self, Time, Situation, Place  O2 Continent Weight: 227 lb 15.3 oz (103.4 kg) Height:  '6\' 2"'$  (188 cm)  BEHAVIORAL SYMPTOMS/MOOD NEUROLOGICAL BOWEL NUTRITION STATUS      Continent Diet (see discharge summary)  AMBULATORY STATUS COMMUNICATION OF NEEDS Skin   Total Care Verbally Surgical wounds                       Personal Care Assistance Level of Assistance  Bathing, Feeding, Dressing Bathing Assistance: Maximum assistance Feeding assistance: Independent Dressing Assistance: Maximum assistance     Functional Limitations Info  Sight, Hearing, Speech Sight Info: Adequate Hearing Info: Impaired Speech Info: Adequate    SPECIAL CARE FACTORS FREQUENCY  PT (By licensed PT), OT (By licensed OT)     PT Frequency: 5x week OT  Frequency: 5x week            Contractures Contractures Info: Not present    Additional Factors Info  Code Status, Allergies Code Status Info: full Allergies Info: vancomycin           Current Medications (03/23/2022):  This is the current hospital active medication list Current Facility-Administered Medications  Medication Dose Route Frequency Provider Last Rate Last Admin   0.9 %  sodium chloride infusion   Intravenous Continuous Irving Copas, PA-C       acetaminophen (TYLENOL) tablet 325-650 mg  325-650 mg Oral Q6H PRN Irving Copas, PA-C       atorvastatin (LIPITOR) tablet 20 mg  20 mg Oral Daily Irving Copas, PA-C   20 mg at 03/23/22 2482   bisacodyl (DULCOLAX) suppository 10 mg  10 mg Rectal Daily PRN Irving Copas, PA-C       Chlorhexidine Gluconate Cloth 2 % PADS 6 each  6 each Topical Daily Doutova, Anastassia, MD       diphenhydrAMINE (BENADRYL) 12.5 MG/5ML elixir 12.5-25 mg  12.5-25 mg Oral Q4H PRN Irving Copas, PA-C       docusate sodium (COLACE) capsule 100 mg  100 mg Oral BID Irving Copas, PA-C   100 mg at 03/23/22 0813   ferrous sulfate tablet 325 mg  325 mg Oral TID PC Irving Copas, PA-C   325 mg at 03/23/22 1335   finasteride (PROSCAR) tablet 5 mg  5 mg Oral Daily Lu Duffel,  Nicola Girt, PA-C   5 mg at 03/23/22 9373   HYDROcodone-acetaminophen (NORCO) 7.5-325 MG per tablet 1-2 tablet  1-2 tablet Oral Q4H PRN Irving Copas, PA-C       HYDROcodone-acetaminophen (NORCO/VICODIN) 5-325 MG per tablet 1-2 tablet  1-2 tablet Oral Q4H PRN Irving Copas, PA-C       HYDROmorphone (DILAUDID) injection 0.5 mg  0.5 mg Intravenous Q4H PRN Irving Copas, PA-C   0.5 mg at 03/23/22 0134   menthol-cetylpyridinium (CEPACOL) lozenge 3 mg  1 lozenge Oral PRN Irving Copas, PA-C       Or   phenol (CHLORASEPTIC) mouth spray 1 spray  1 spray Mouth/Throat PRN Irving Copas, PA-C       methocarbamol (ROBAXIN) tablet 500 mg  500 mg Oral Q6H PRN  Costella Hatcher R, PA-C   500 mg at 03/23/22 4287   Or   methocarbamol (ROBAXIN) 500 mg in dextrose 5 % 50 mL IVPB  500 mg Intravenous Q6H PRN Irving Copas, PA-C       metoCLOPramide (REGLAN) tablet 5-10 mg  5-10 mg Oral Q8H PRN Irving Copas, PA-C       Or   metoCLOPramide (REGLAN) injection 5-10 mg  5-10 mg Intravenous Q8H PRN Irving Copas, PA-C       ondansetron [facility]) tablet 4 mg  4 mg Oral Q6H PRN Irving Copas, PA-C       Or   ondansetron [facility]) injection 4 mg  4 mg Intravenous Q6H PRN Costella Hatcher R, PA-C       polyethylene glycol (MIRALAX / GLYCOLAX) packet 17 g  17 g Oral Daily PRN Irving Copas, PA-C       rivaroxaban (XARELTO) tablet 10 mg  10 mg Oral Daily Nita Sells, MD   10 mg at 03/23/22 1335   tamsulosin (FLOMAX) capsule 0.4 mg  0.4 mg Oral QPC breakfast Irving Copas, PA-C   0.4 mg at 03/23/22 6811     Discharge Medications: Please see discharge summary for a list of discharge medications.  Relevant Imaging Results:  Relevant Lab Results:   Additional Information SSN: [id number], pt reports he is vaccinated for covid with one booster.  Joanne Chars, LCSW

## 2022-03-23 NOTE — Progress Notes (Signed)
   Subjective: 1 Day Post-Op Procedure(s) (LRB): ANTERIOR TOTAL HIP ARTHROPLASTY (Left) Patient reports pain as moderate.   Patient seen in rounds for Dr. Alvan Dame. Patient is resting in bed on exam this morning. He is hard of hearing. He was asleep when I walked in, but then complains of significant pain in the hip. He tells me he may be able to stay with his daughter at discharge.  We will start therapy today.   Objective: Vital signs in last 24 hours: Temp:  [97.3 F (36.3 C)-98.4 F (36.9 C)] 98.1 F (36.7 C) (07/25 0400) Pulse Rate:  [93-115] 93 (07/25 0400) Resp:  [16-29] 16 (07/25 0400) BP: (113-164)/(73-103) 113/79 (07/25 0400) SpO2:  [92 %-100 %] 100 % (07/25 0400) Weight:  [103.4 kg] 103.4 kg (07/24 1446)  Intake/Output from previous day:  Intake/Output Summary (Last 24 hours) at 03/23/2022 0707 Last data filed at 03/23/2022 0656 Gross per 24 hour  Intake 1420.33 ml  Output 2675 ml  Net -1254.67 ml     Intake/Output this shift: No intake/output data recorded.  Labs: Recent Labs    03/21/22 1756 03/22/22 0428 03/23/22 0338  HGB 16.4 15.6 13.7   Recent Labs    03/22/22 0428 03/23/22 0338  WBC 10.0 12.0*  RBC 5.04 4.46  HCT 45.7 39.8  PLT 203 170   Recent Labs    03/22/22 0428 03/23/22 0338  NA 134* 131*  K 4.1 4.6  CL 100 99  CO2 27 22  BUN 12 12  CREATININE 0.93 0.81  GLUCOSE 122* 139*  CALCIUM 8.8* 8.4*   Recent Labs    03/21/22 1756  INR 1.1    Exam: General - Patient is Alert and Oriented Extremity - Neurologically intact Sensation intact distally Intact pulses distally Dorsiflexion/Plantar flexion intact Dressing - dressing C/D/I Motor Function - intact, moving foot and toes well on exam.   Past Medical History:  Diagnosis Date   DVT (deep venous thrombosis) (HCC)    Headache    Hypercholesterolemia    Hypertension     Assessment/Plan: 1 Day Post-Op Procedure(s) (LRB): ANTERIOR TOTAL HIP ARTHROPLASTY (Left) Principal  Problem:   Closed left hip fracture (HCC) Active Problems:   Personal history of DVT (deep vein thrombosis)   Essential hypertension   Hyperlipidemia   Acute urinary retention   S/P total left hip arthroplasty  Estimated body mass index is 29.27 kg/m as calculated from the following:   Height as of this encounter: '6\' 2"'$  (1.88 m).   Weight as of this encounter: 103.4 kg. Advance diet Up with therapy  DVT Prophylaxis - Aspirin Weight bearing as tolerated.  Hgb stable at 13.7 this AM.  Patient lives alone, but it sounds as if he may be able to stay with his daughter at discharge to avoid SNF.   Plan to get up with PT today.   Will discharge to appropriate venue per medicine when meeting goals with PT and otherwise stable.   Griffith Citron, PA-C Orthopedic Surgery [phone] 03/23/2022, 7:07 AM

## 2022-03-23 NOTE — TOC CAGE-AID Note (Signed)
Transition of Care [facility]) - CAGE-AID Screening   Patient Details  Name: ZAUL HUBERS MRN: [id number] Date of Birth: [birth year]  Clinical Narrative: Patient denies drug/alcohol use.   CAGE-AID Screening:    Have You Ever Felt You Ought to Cut Down on Your Drinking or Drug Use?: No Have People Annoyed You By Critizing Your Drinking Or Drug Use?: No Have You Felt Bad Or Guilty About Your Drinking Or Drug Use?: No Have You Ever Had a Drink or Used Drugs First Thing In The Morning to Steady Your Nerves or to Get Rid of a Hangover?: No CAGE-AID Score: 0  Substance Abuse Education Offered: No

## 2022-03-23 NOTE — Plan of Care (Signed)

## 2022-03-23 NOTE — Anesthesia Postprocedure Evaluation (Signed)
Anesthesia Post Note  Patient: Alex [name]  Procedure(s) Performed: ANTERIOR TOTAL HIP ARTHROPLASTY (Left: Hip)     Patient location during evaluation: PACU Anesthesia Type: General Level of consciousness: awake Pain management: pain level controlled Respiratory status: spontaneous breathing Cardiovascular status: stable Postop Assessment: no apparent nausea or vomiting Anesthetic complications: no   No notable events documented.  Last Vitals:  Vitals:   03/22/22 2100 03/22/22 2350  BP: 138/73 116/76  Pulse: (!) 107 94  Resp: 18 20  Temp: 36.7 C 36.7 C  SpO2: 96% 97%    Last Pain:  Vitals:   03/22/22 2350  TempSrc: Oral  PainSc:                  Other [place]

## 2022-03-23 NOTE — Evaluation (Signed)
Physical Therapy Evaluation Patient Details Name: Alex [name] MRN: [id number] DOB: [birth year] Today's Date: 03/23/2022  History of Present Illness  Admitted post Accidental fall outside a restaurant on 7/23 and found to have hip fracture; s/p L THA for surgical fixation, WBAT;  has a past medical history of DVT (deep venous thrombosis) ([place]), Headache, Hypercholesterolemia, and Hypertension, ACDF 2019  Clinical Impression   Pt admitted with above diagnosis. Lives at home alone, in a 2-level home with afew  steps to enter and 14 to reach second floor; Prior to admission, pt was able to manage independently, using RW for amb and wearing a back brace due to previous injury; he has the option to go to his daughter's single-level home; Presents to PT with significant L hip pain limiting his functional mobility and balance (sitting and standing), functional dependencies, decr activity tolerance; difficulty bearing weight on LLE in stance due ot pain;  Needs 2 person Max assist to come to sit at EOB; Unable to get bil hips and knees to full extension with standing trials;  Pt currently with functional limitations due to the deficits listed below (see PT Problem List). Pt will benefit from skilled PT to increase their independence and safety with mobility to allow discharge to the venue listed below.          Recommendations for follow up therapy are one component of a multi-disciplinary discharge planning process, led by the attending physician.  Recommendations may be updated based on patient status, additional functional criteria and insurance authorization.  Follow Up Recommendations Skilled nursing-short term rehab (<3 hours/day) Can patient physically be transported by private vehicle: No    Assistance Recommended at Discharge Frequent or constant Supervision/Assistance  Patient can return home with the following  Two people to help with walking and/or transfers;Two people to help with  bathing/dressing/bathroom    Equipment Recommendations BSC/3in1  Recommendations for Other Services       Functional Status Assessment Patient has had a recent decline in their functional status and demonstrates the ability to make significant improvements in function in a reasonable and predictable amount of time.     Precautions / Restrictions Precautions Precautions: Back;Fall Precaution Comments: Recent back injury, and follows back precautions for comfort; Still, with L hip pain, log rolling is quite difficult Required Braces or Orthoses: Spinal Brace Spinal Brace: Other (comment) (Pt puts on his brace sitting EOB) Restrictions Weight Bearing Restrictions: No LLE Weight Bearing: Weight bearing as tolerated      Mobility  Bed Mobility Overal bed mobility: Needs Assistance Bed Mobility: Supine to Sit, Sit to Supine     Supine to sit: Max assist, +2 for physical assistance, +2 for safety/equipment Sit to supine: Total assist, +2 for physical assistance   General bed mobility comments: Cues and mod assist (with use of drawsheet) to ease hips to EOB; Max Assist to elevate trunk to sit and support at initial sit EOB; very heavy posterolateral lean to R due to pain, requiring Max assist/support at back, and close guard/blocking  of knees to prevent sliding forward; Lots of time to get to fully upright sitting; Pt taking longer to respond to questions at that time, stating, "I have to block out the pain"    Transfers Overall transfer level: Needs assistance Equipment used: Rolling walker (2 wheels) Transfers: Sit to/from Stand Sit to Stand: +2 physical assistance, Mod assist, Max assist           General transfer comment: Performed 2 standing trials  form elevated bed with 2 person Mod/Max assist and pt's own RW; able to clear hips from EOB, however stood with hips and knees in quite flexed position, and was not stable enough to pivot or take steps to chair; sat back down to  bed    Ambulation/Gait                  Stairs            Wheelchair Mobility    Modified Rankin (Stroke Patients Only)       Balance Overall balance assessment: Needs assistance   Sitting balance-Leahy Scale: Poor       Standing balance-Leahy Scale: Zero                               Pertinent Vitals/Pain Pain Assessment Pain Assessment: 0-10 Pain Score: 10-Worst pain ever Pain Location: L hip Pain Descriptors / Indicators: Aching, Grimacing, Guarding Pain Intervention(s): Monitored during session, Limited activity within patient's tolerance    Home Living Family/patient expects to be discharged to:: Private residence Living Arrangements: Alone (option to go home with daughter) Available Help at Discharge: Family;Available PRN/intermittently Type of Home: House Home Access: Stairs to enter   [employer] of Steps: 3-6 (3 steps no rails form carport; 6 steps, 1 rail at front door of daughter's home) Alternate Level Stairs-Number of Steps: 14 Home Layout: Two level (at pt's townhome; Daughter's home is all on one level) Home Equipment: Conservation officer, nature (2 wheels)      Prior Function Prior Level of Function : Independent/Modified Independent             Mobility Comments: Has been using a RW and a back brace for several weeks ADLs Comments: Independent     Hand Dominance   Dominant Hand: Right    Extremity/Trunk Assessment   Upper Extremity Assessment Upper Extremity Assessment: Overall WFL for tasks assessed (for simple tasks; pt reports has had a R TSA)    Lower Extremity Assessment Lower Extremity Assessment: LLE deficits/detail LLE Deficits / Details: Grossly decr AROM and strength, limited by extreme pain postop; unable to accept weight onto LLE during standing attempts       Communication   Communication: HOH  Cognition Arousal/Alertness: Awake/alert Behavior During Therapy: WFL for tasks  assessed/performed, Anxious Overall Cognitive Status: No family/caregiver present to determine baseline cognitive functioning Area of Impairment: Attention, Memory, Following commands, Problem solving                   Current Attention Level: Sustained (Difficulty attending to cues/directions; quite internally distracted by pain (as well as HOH)) Memory: Decreased short-term memory Following Commands: Follows one step commands with increased time     Problem Solving: Difficulty sequencing, Requires verbal cues, Requires tactile cues General Comments: Told teh story of how he fell twice within the span of 5-7 minutes        General Comments General comments (skin integrity, edema, etc.): Bulk of session conducted on Room Air and O2 sats ranged 89-98%; HR incr to 143bpm (observed highest) with transition movements and standing trials; HR eased back down, but still elevated end of session at 126bpm; Reapplied supplemental O2    Exercises     Assessment/Plan    PT Assessment Patient needs continued PT services  PT Problem List Decreased strength;Decreased range of motion;Decreased activity tolerance;Decreased balance;Decreased mobility;Decreased coordination;Decreased cognition;Decreased knowledge of use of DME;Decreased safety awareness;Decreased knowledge  of precautions;Cardiopulmonary status limiting activity;Pain       PT Treatment Interventions DME instruction;Gait training;Functional mobility training;Therapeutic activities;Therapeutic exercise;Balance training;Neuromuscular re-education;Cognitive remediation;Patient/family education    PT Goals (Current goals can be found in the Care Plan section)  Acute Rehab PT Goals Patient Stated Goal: Did not state PT Goal Formulation: With patient Time For Goal Achievement: 04/06/22 Potential to Achieve Goals: Good    Frequency Min 3X/week     Co-evaluation               AM-PAC PT "6 Clicks" Mobility  Outcome  Measure Help needed turning from your back to your side while in a flat bed without using bedrails?: A Lot Help needed moving from lying on your back to sitting on the side of a flat bed without using bedrails?: Total Help needed moving to and from a bed to a chair (including a wheelchair)?: Total Help needed standing up from a chair using your arms (e.g., wheelchair or bedside chair)?: Total Help needed to walk in hospital room?: Total Help needed climbing 3-5 steps with a railing? : Total 6 Click Score: 7    End of Session Equipment Utilized During Treatment: Gait belt;Back brace (pt's own back brace) Activity Tolerance: Patient limited by pain Patient left: in bed;with call bell/phone within reach;with bed alarm set (bed in chair position) Nurse Communication: Mobility status PT Visit Diagnosis: Unsteadiness on feet (R26.81);Other abnormalities of gait and mobility (R26.89);History of falling (Z91.81);Muscle weakness (generalized) (M62.81);Pain Pain - Right/Left: Left Pain - part of body: Hip    Time: 0903-0950 PT Time Calculation (min) (ACUTE ONLY): 47 min   Charges:   PT Evaluation $PT Eval Moderate Complexity: 1 Mod PT Treatments $Therapeutic Activity: 23-37 mins        Roney Marion, PT  Acute Rehabilitation Services Office [phone]   [place] 03/23/2022, 11:07 AM

## 2022-03-24 DIAGNOSIS — R338 Other retention of urine: Secondary | ICD-10-CM | POA: Diagnosis not present

## 2022-03-24 DIAGNOSIS — I10 Essential (primary) hypertension: Secondary | ICD-10-CM | POA: Diagnosis not present

## 2022-03-24 DIAGNOSIS — S72002A Fracture of unspecified part of neck of left femur, initial encounter for closed fracture: Secondary | ICD-10-CM | POA: Diagnosis not present

## 2022-03-24 DIAGNOSIS — S72002D Fracture of unspecified part of neck of left femur, subsequent encounter for closed fracture with routine healing: Secondary | ICD-10-CM

## 2022-03-24 DIAGNOSIS — E7800 Pure hypercholesterolemia, unspecified: Secondary | ICD-10-CM

## 2022-03-24 LAB — CBC WITH DIFFERENTIAL/PLATELET
Abs Immature Granulocytes: 0.12 10*3/uL — ABNORMAL HIGH (ref 0.00–0.07)
Basophils Absolute: 0 10*3/uL (ref 0.0–0.1)
Basophils Relative: 0 %
Eosinophils Absolute: 0.1 10*3/uL (ref 0.0–0.5)
Eosinophils Relative: 1 %
HCT: 32.9 % — ABNORMAL LOW (ref 39.0–52.0)
Hemoglobin: 11.4 g/dL — ABNORMAL LOW (ref 13.0–17.0)
Immature Granulocytes: 1 %
Lymphocytes Relative: 12 %
Lymphs Abs: 1.7 10*3/uL (ref 0.7–4.0)
MCH: 30.9 pg (ref 26.0–34.0)
MCHC: 34.7 g/dL (ref 30.0–36.0)
MCV: 89.2 fL (ref 80.0–100.0)
Monocytes Absolute: 1.6 10*3/uL — ABNORMAL HIGH (ref 0.1–1.0)
Monocytes Relative: 12 %
Neutro Abs: 10.1 10*3/uL — ABNORMAL HIGH (ref 1.7–7.7)
Neutrophils Relative %: 74 %
Platelets: 163 10*3/uL (ref 150–400)
RBC: 3.69 MIL/uL — ABNORMAL LOW (ref 4.22–5.81)
RDW: 13.8 % (ref 11.5–15.5)
WBC: 13.5 10*3/uL — ABNORMAL HIGH (ref 4.0–10.5)
nRBC: 0 % (ref 0.0–0.2)

## 2022-03-24 LAB — BASIC METABOLIC PANEL
Anion gap: 12 (ref 5–15)
BUN: 17 mg/dL (ref 8–23)
CO2: 24 mmol/L (ref 22–32)
Calcium: 8.3 mg/dL — ABNORMAL LOW (ref 8.9–10.3)
Chloride: 95 mmol/L — ABNORMAL LOW (ref 98–111)
Creatinine, Ser: 0.81 mg/dL (ref 0.61–1.24)
GFR, Estimated: >60 mL/min (ref >60)
Glucose, Bld: 127 mg/dL — ABNORMAL HIGH (ref 70–99)
Potassium: 4.2 mmol/L (ref 3.5–5.1)
Sodium: 131 mmol/L — ABNORMAL LOW (ref 135–145)

## 2022-03-24 MED ORDER — BISACODYL 5 MG PO TBEC
10.0000 mg | DELAYED_RELEASE_TABLET | Freq: Once | ORAL | Status: AC
  Administered 2022-03-24: 10 mg via ORAL
  Filled 2022-03-24: qty 2

## 2022-03-24 MED ORDER — METOPROLOL TARTRATE 12.5 MG HALF TABLET
12.5000 mg | ORAL_TABLET | Freq: Two times a day (BID) | ORAL | Status: DC
  Administered 2022-03-24 – 2022-03-29 (×11): 12.5 mg via ORAL
  Filled 2022-03-24 (×11): qty 1

## 2022-03-24 NOTE — TOC Progression Note (Signed)
Transition of Care [facility]) - Progression Note    Patient Details  Name: Alex [name] MRN: [id number] Date of Birth: 1951/03/06  Transition of Care [facility]) CM/SW Contact  Joanne Chars, LCSW Phone Number: 03/24/2022, 4:07 PM  Clinical Narrative:   CSW spoke with pt and he said he has not yet spoken with his daughter about facility choice.  CSW spoke with daughter Rojelio Brenner by phone and provided current offers, she would like to explore [place] options.   CSW communicated with [facility], [facility], [facility], [facility]  All declined due to [facility] being primary. [facility] also rescinded bed offer due to insurance.  [facility] confirmed they can accept with BCBS.  [facility] is checking on this.  1500: CSW spoke with pt daughter and updated her. She will call back.   1600.[facility] still reviewing  Daughter called back, wants to move forward with [facility]  CSW messaged Kathy/[facility] and asked her to move forward with auth.      Expected Discharge Plan: [place] Barriers to Discharge: Continued Medical Work up, SNF Pending bed offer  Expected Discharge Plan and Services Expected Discharge Plan: [place] In-house Referral: Clinical Social Work   Post Acute Care Choice: [place] Living arrangements for the past 2 months: Single Family Home                                       Social Determinants of Health (SDOH) Interventions    Readmission Risk Interventions     No data to display

## 2022-03-24 NOTE — Progress Notes (Signed)
[facility]                                                                              Alex [name], is a 71 y.o. 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  Imaging showed left hip fracture Orthopedics was consulted, underwent left total hip repair on 7/24    Assessment & Plan    Principal Problem: Mechanical fall with closed left hip fracture ([place]) -Underwent left total hip repair anterior approach, Dr. Alvan Dame on 7/24, postop day #2 -Pain controlled, continue Xarelto -PT recommendation for SNF short-term rehab, awaiting SNF authorization  Active Problems: Recurrent history of DVT/PE  -Xarelto resumed    Essential hypertension -BP currently stable    Hyperlipidemia -Continue atorvastatin    Acute urinary retention, history of BPH -Continue Flomax, finasteride  Sinus tachycardia -EKG on admission showed sinus tachycardia, no chest pain or acute shortness of breath. -2D echo on 7/24 showed EF of 60 to 65%, no regional wall motion abnormalities -Placed on Lopressor 12.5 mg twice daily, titrate as needed  Constipation -Continue Colace, MiraLAX as needed, added Dulcolax 10 mg p.o. x1  Code Status: Full code DVT Prophylaxis:  rivaroxaban (XARELTO) tablet 10 mg Start: 03/23/22 1115 SCDs Start: 03/23/22 0026 Place TED hose Start: 03/23/22 0026 Place and maintain sequential compression device Start: 03/22/22 1153 SCDs Start: 03/21/22 1940 rivaroxaban (XARELTO) tablet 10 mg   Level of Care: Level of care: Progressive Family Communication: Updated  patient   Disposition Plan:      Remains inpatient appropriate: PT recommended SNF, awaiting authorization   Procedures:  7/24 left total hip replacement through an anterior approach   Consultants:   Orthopedics  Antimicrobials:   Anti-infectives (From admission, onward)    Start     Dose/Rate Route Frequency Ordered Stop   03/23/22 0600  ceFAZolin (ANCEF) IVPB 2g/100 mL premix        2 g 200 mL/hr over 30 Minutes Intravenous On call to O.R. 03/22/22 1419 03/22/22 1700   03/22/22 1915  ceFAZolin (ANCEF) IVPB 2g/100 mL premix        2 g 200 mL/hr over 30 Minutes Intravenous Every 6 hours 03/22/22 1907 03/23/22 0052          Medications  atorvastatin  20 mg Oral Daily   Chlorhexidine Gluconate Cloth  6 each Topical Daily   docusate sodium  100 mg Oral BID   ferrous sulfate  325 mg Oral TID PC   finasteride  5 mg Oral Daily   rivaroxaban  10 mg Oral Daily   tamsulosin  0.4 mg Oral QPC breakfast      Subjective:   Alex Fries  [name] was seen and examined today. + Constipation, pain in the left hip.  Patient denies dizziness, chest pain, shortness of breath, abdominal pain, nausea or vomiting.  Objective:   Vitals:   03/23/22 1504 03/23/22 2034 03/24/22 0400 03/24/22 0932  BP: 126/69 120/72 115/74 125/80  Pulse: (!) 112 (!) 110 (!) 101 (!) 109  Resp:  15 19   Temp: (!) 97.4 F (36.3 C) 98.2 F (36.8 C) 98.1 F (36.7 C)   TempSrc: Oral Oral Oral   SpO2: 93% 95% 96% 96%  Weight:      Height:        Intake/Output Summary (Last 24 hours) at 03/24/2022 1218 Last data filed at 03/24/2022 0400 Gross per 24 hour  Intake --  Output 350 ml  Net -350 ml     Wt Readings from Last 3 Encounters:  03/22/22 103.4 kg  11/28/17 107.5 kg  11/21/17 107.5 kg     Exam General: Alert and oriented x 3, NAD Cardiovascular: S1 S2 auscultated,  RRR, tachycardia Respiratory: CTA B Gastrointestinal: Soft, nontender, nondistended, + bowel sounds Ext: no pedal edema  bilaterally Neuro: no new FND's Psych: Normal affect and demeanor, alert and oriented x3     Data Reviewed:  I have personally reviewed following labs    CBC Lab Results  Component Value Date   WBC 13.5 (H) 03/24/2022   RBC 3.69 (L) 03/24/2022   HGB 11.4 (L) 03/24/2022   HCT 32.9 (L) 03/24/2022   MCV 89.2 03/24/2022   MCH 30.9 03/24/2022   PLT 163 03/24/2022   MCHC 34.7 03/24/2022   RDW 13.8 03/24/2022   LYMPHSABS 1.7 03/24/2022   MONOABS 1.6 (H) 03/24/2022   EOSABS 0.1 03/24/2022   BASOSABS 0.0 74/03/1447     Last metabolic panel Lab Results  Component Value Date   NA 131 (L) 03/24/2022   K 4.2 03/24/2022   CL 95 (L) 03/24/2022   CO2 24 03/24/2022   BUN 17 03/24/2022   CREATININE 0.81 03/24/2022   GLUCOSE 127 (H) 03/24/2022   GFRNONAA >60 03/24/2022   GFRAA >60 04/19/2019   CALCIUM 8.3 (L) 03/24/2022   PHOS 2.6 03/21/2022   PROT 6.0 (L) 03/21/2022   ALBUMIN 3.1 (L) 03/21/2022   BILITOT 0.8 03/21/2022   ALKPHOS 84 03/21/2022   AST 20 03/21/2022   ALT 24 03/21/2022   ANIONGAP 12 03/24/2022    CBG (last 3)  No results for input(s): "GLUCAP" in the last 72 hours.    Coagulation Profile: Recent Labs  Lab 03/21/22 1756  INR 1.1     Radiology Studies: I have personally reviewed the imaging studies  DG Pelvis Portable  Result Date: 03/22/2022 CLINICAL DATA:  [id number]. Post op films for left total hip arthroplasty EXAM: PORTABLE PELVIS 1-2 VIEWS COMPARISON:  None Available. FINDINGS: Total left hip arthroplasty. There is no evidence of pelvic fracture or diastasis. At least mild degenerative changes of the right hip. No pelvic bone lesions are seen. Subcutaneus soft tissue edema and emphysema overlying the left hip consistent with postsurgical changes. IMPRESSION: Status post total left hip arthroplasty. Electronically Signed   By: Iven Finn M.D.   On: 03/22/2022 19:43   DG HIP UNILAT WITH PELVIS 1V LEFT  Result Date: 03/22/2022 CLINICAL DATA:   Anterior total hip arthroplasty EXAM: DG HIP (WITH OR WITHOUT PELVIS) 1V*L* COMPARISON:  03/21/2022 FINDINGS: Intraoperative fluoroscopy is obtained for surgical control purposes. Fluoroscopy time is recorded at 16 seconds. Dose 2.73 mGy. A single spot fluoroscopic  images provided. Spot fluoroscopic image demonstrates placement of a left hip arthroplasty, incompletely included within the field of view. IMPRESSION: Intraoperative fluoroscopy obtained for surgical control purposes during placement of left hip arthroplasty. Electronically Signed   By: Lucienne Capers M.D.   On: 03/22/2022 19:08   DG C-Arm 1-60 Min-No Report  Result Date: 03/22/2022 Fluoroscopy was utilized by the requesting physician.  No radiographic interpretation.       Estill Cotta M.D. [facility] 03/24/2022, 12:18 PM  Available via Epic secure chat 7am-7pm After 7 pm, please refer to night coverage provider listed on amion.

## 2022-03-25 DIAGNOSIS — R338 Other retention of urine: Secondary | ICD-10-CM | POA: Diagnosis not present

## 2022-03-25 DIAGNOSIS — S72002A Fracture of unspecified part of neck of left femur, initial encounter for closed fracture: Secondary | ICD-10-CM | POA: Diagnosis not present

## 2022-03-25 DIAGNOSIS — I10 Essential (primary) hypertension: Secondary | ICD-10-CM | POA: Diagnosis not present

## 2022-03-25 LAB — BASIC METABOLIC PANEL
Anion gap: 5 (ref 5–15)
BUN: 20 mg/dL (ref 8–23)
CO2: 27 mmol/L (ref 22–32)
Calcium: 8 mg/dL — ABNORMAL LOW (ref 8.9–10.3)
Chloride: 100 mmol/L (ref 98–111)
Creatinine, Ser: 0.84 mg/dL (ref 0.61–1.24)
GFR, Estimated: >60 mL/min (ref >60)
Glucose, Bld: 114 mg/dL — ABNORMAL HIGH (ref 70–99)
Potassium: 3.9 mmol/L (ref 3.5–5.1)
Sodium: 132 mmol/L — ABNORMAL LOW (ref 135–145)

## 2022-03-25 LAB — CBC WITH DIFFERENTIAL/PLATELET
Abs Immature Granulocytes: 0.15 10*3/uL — ABNORMAL HIGH (ref 0.00–0.07)
Basophils Absolute: 0 10*3/uL (ref 0.0–0.1)
Basophils Relative: 0 %
Eosinophils Absolute: 0.3 10*3/uL (ref 0.0–0.5)
Eosinophils Relative: 3 %
HCT: 33.3 % — ABNORMAL LOW (ref 39.0–52.0)
Hemoglobin: 11.3 g/dL — ABNORMAL LOW (ref 13.0–17.0)
Immature Granulocytes: 2 %
Lymphocytes Relative: 14 %
Lymphs Abs: 1.5 10*3/uL (ref 0.7–4.0)
MCH: 31 pg (ref 26.0–34.0)
MCHC: 33.9 g/dL (ref 30.0–36.0)
MCV: 91.2 fL (ref 80.0–100.0)
Monocytes Absolute: 1.3 10*3/uL — ABNORMAL HIGH (ref 0.1–1.0)
Monocytes Relative: 13 %
Neutro Abs: 7.1 10*3/uL (ref 1.7–7.7)
Neutrophils Relative %: 68 %
Platelets: 189 10*3/uL (ref 150–400)
RBC: 3.65 MIL/uL — ABNORMAL LOW (ref 4.22–5.81)
RDW: 13.9 % (ref 11.5–15.5)
WBC: 10.3 10*3/uL (ref 4.0–10.5)
nRBC: 0 % (ref 0.0–0.2)

## 2022-03-25 NOTE — Plan of Care (Signed)

## 2022-03-25 NOTE — Progress Notes (Signed)
Physical Therapy Treatment Patient Details Name: Alex [name] MRN: [id number] DOB: [birth year] Today's Date: 03/25/2022   History of Present Illness 71 y/o male admitted post accidental fall at restaurant on 03/21/22 and found to have hip fracture; s/p L THA for surgical fixation, WBAT. PMH: DVT (deep venous thrombosis) ([place]), Headache, Hypercholesterolemia, and Hypertension, ACDF 2019    PT Comments    Patient seen to progress mobility. Continues to require maxA+2 for bed mobility. Able to stand fully upright with Stedy and modA+2. Able to perform sit to stand x 4 from [facility] flaps with min guard. Patient performed standing marching in [place]. Anticipate next session, patient will be able to stand and take steps with use of RW. Charlaine Dalton was helpful to build confidence in his ability. Continue to recommend SNF for ongoing Physical Therapy.       Recommendations for follow up therapy are one component of a multi-disciplinary discharge planning process, led by the attending physician.  Recommendations may be updated based on patient status, additional functional criteria and insurance authorization.  Follow Up Recommendations  Skilled nursing-short term rehab (<3 hours/day) Can patient physically be transported by private vehicle: No   Assistance Recommended at Discharge Frequent or constant Supervision/Assistance  Patient can return home with the following Two people to help with walking and/or transfers;Two people to help with bathing/dressing/bathroom   Equipment Recommendations  BSC/3in1    Recommendations for Other Services       Precautions / Restrictions Precautions Precautions: Back;Fall Precaution Comments: Recent back injury, and follows back precautions for comfort; Still, with L hip pain, log rolling is quite difficult Required Braces or Orthoses: Spinal Brace Spinal Brace: Lumbar corset;Applied in sitting position Restrictions Weight Bearing Restrictions: Yes LLE Weight  Bearing: Weight bearing as tolerated     Mobility  Bed Mobility Overal bed mobility: Needs Assistance Bed Mobility: Supine to Sit     Supine to sit: Mod assist, +2 for physical assistance, +2 for safety/equipment     General bed mobility comments: able to slowly bring LEs over to EOB. ModA+2 for trunk elevation and repositioning hips towards EOB. Patient able to scoot backwards and readjust with UEs    Transfers Overall transfer level: Needs assistance Equipment used: Ambulation equipment used Transfers: Sit to/from Stand, Bed to chair/wheelchair/BSC Sit to Stand: Mod assist, +2 safety/equipment, Min guard           General transfer comment: required modA+2 to stand from bed surface into Stedy and min guard from [place] flaps. Transferred to chair via Stedy. Should be able to stand with RW next session. Attempted with Stedy due to previous session Transfer via Lift Equipment: Stedy  Ambulation/Gait             Pre-gait activities: able to perform standing marching while [employer] (Stroke Patients Only)       Balance Overall balance assessment: Needs assistance Sitting-balance support: Bilateral upper extremity supported, Feet supported Sitting balance-Leahy Scale: Fair     Standing balance support: Bilateral upper extremity supported, Reliant on assistive device for balance Standing balance-Leahy Scale: Poor                              Cognition Arousal/Alertness: Awake/alert Behavior During Therapy: WFL for tasks assessed/performed, Anxious Overall Cognitive Status: No family/caregiver present to determine baseline cognitive  functioning Area of Impairment: Attention, Memory, Following commands, Problem solving                   Current Attention Level: Sustained Memory: Decreased short-term memory Following Commands: Follows one step commands with increased time      Problem Solving: Difficulty sequencing, Requires verbal cues, Requires tactile cues          Exercises Other Exercises Other Exercises: sit to stand in Stedy x 4    General Comments        Pertinent Vitals/Pain Pain Assessment Pain Assessment: Faces Faces Pain Scale: Hurts even more Pain Location: L hip Pain Descriptors / Indicators: Aching, Grimacing, Guarding Pain Intervention(s): Monitored during session    Home Living                          Prior Function            PT Goals (current goals can now be found in the care plan section) Acute Rehab PT Goals Patient Stated Goal: Did not state PT Goal Formulation: With patient Time For Goal Achievement: 04/06/22 Potential to Achieve Goals: Good Progress towards PT goals: Progressing toward goals    Frequency    Min 3X/week      PT Plan Current plan remains appropriate    Co-evaluation              AM-PAC PT "6 Clicks" Mobility   Outcome Measure  Help needed turning from your back to your side while in a flat bed without using bedrails?: A Lot Help needed moving from lying on your back to sitting on the side of a flat bed without using bedrails?: Total Help needed moving to and from a bed to a chair (including a wheelchair)?: Total Help needed standing up from a chair using your arms (e.g., wheelchair or bedside chair)?: Total Help needed to walk in hospital room?: Total Help needed climbing 3-5 steps with a railing? : Total 6 Click Score: 7    End of Session Equipment Utilized During Treatment: Gait belt;Back brace Activity Tolerance: Patient tolerated treatment well Patient left: in chair;with call bell/phone within reach Nurse Communication: Mobility status PT Visit Diagnosis: Unsteadiness on feet (R26.81);Other abnormalities of gait and mobility (R26.89);History of falling (Z91.81);Muscle weakness (generalized) (M62.81);Pain Pain - Right/Left: Left Pain - part of body: Hip      Time: 5883-2549 PT Time Calculation (min) (ACUTE ONLY): 25 min  Charges:  $Therapeutic Exercise: 8-22 mins $Therapeutic Activity: 8-22 mins                     Senna Lape A. Gilford Rile PT, DPT Acute Rehabilitation Services Office [phone]    Linna Hoff 03/25/2022, 4:56 PM

## 2022-03-25 NOTE — TOC Progression Note (Signed)
Transition of Care [facility]) - Progression Note    Patient Details  Name: Alex [name] MRN: [id number] Date of Birth: [birth year]  Transition of [facility] CM/SW Contact  Joanne Chars, LCSW Phone Number: 03/25/2022, 2:32 PM  Clinical Narrative:    CSW contacted by [facility], now saying they are out of network with pt insurance and cannot accept pt.  CSW contacted pt daughter Rojelio Brenner, updated her, remaining bed offers are [facility] and [place], she will accept offer at [facility].  CSW LM with [facility].  1430: TC [facility].  She will initiate auth.   Expected Discharge Plan: [place] Barriers to Discharge: Continued Medical Work up, SNF Pending bed offer  Expected Discharge Plan and Services Expected Discharge Plan: [place] In-house Referral: Clinical Social Work   Post Acute Care Choice: [place] Living arrangements for the past 2 months: Single Family Home                                       Social Determinants of Health (SDOH) Interventions    Readmission Risk Interventions     No data to display

## 2022-03-25 NOTE — Progress Notes (Signed)
[facility]                                                                              Alex [name], is a 71 y.o. 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  Imaging showed left hip fracture Orthopedics was consulted, underwent left total hip repair on 7/24    Assessment & Plan    Principal Problem: Mechanical fall with closed left hip fracture ([place]) -Underwent left total hip repair anterior approach, Dr. Alvan Dame on 7/24, postop day #3 -Pain controlled, continue Xarelto -PT recommendation for SNF short-term rehab, awaiting SNF authorization  Active Problems: Recurrent history of DVT/PE  -Xarelto resumed    Essential hypertension -BP stable, continue Lopressor    Hyperlipidemia -Continue atorvastatin    Acute urinary retention, history of BPH -Continue Flomax, finasteride  Sinus tachycardia -EKG on admission showed sinus tachycardia, no chest pain or acute shortness of breath. -2D echo on 7/24 showed EF of 60 to 65%, no regional wall motion abnormalities -Improved, continue Lopressor  Constipation -Continue Colace, MiraLAX as needed  Code Status: Full code DVT Prophylaxis:  rivaroxaban (XARELTO) tablet 10 mg Start: 03/23/22 1115 SCDs Start: 03/23/22 0026 Place TED hose Start: 03/23/22 0026 Place and maintain sequential compression device Start: 03/22/22 1153 SCDs Start: 03/21/22 1940 rivaroxaban (XARELTO) tablet 10 mg   Level of Care: Level of care: Progressive Family Communication: Updated patient   Disposition Plan:      Remains inpatient  appropriate: PT recommended SNF, awaiting authorization   Procedures:  7/24 left total hip replacement through an anterior approach   Consultants:   Orthopedics  Antimicrobials:   Anti-infectives (From admission, onward)    Start     Dose/Rate Route Frequency Ordered Stop   03/23/22 0600  ceFAZolin (ANCEF) IVPB 2g/100 mL premix        2 g 200 mL/hr over 30 Minutes Intravenous On call to O.R. 03/22/22 1419 03/22/22 1700   03/22/22 1915  ceFAZolin (ANCEF) IVPB 2g/100 mL premix        2 g 200 mL/hr over 30 Minutes Intravenous Every 6 hours 03/22/22 1907 03/23/22 0052          Medications  atorvastatin  20 mg Oral Daily   Chlorhexidine Gluconate Cloth  6 each Topical Daily   docusate sodium  100 mg Oral BID   ferrous sulfate  325 mg Oral TID PC   finasteride  5 mg Oral Daily   metoprolol tartrate  12.5 mg Oral BID   rivaroxaban  10 mg Oral Daily   tamsulosin  0.4 mg Oral QPC breakfast      Subjective:   Alex [name] was seen  and examined today.  No acute complaints, intermittent pain in the left hip.  Awaiting skilled nursing facility  Objective:   Vitals:   03/24/22 0932 03/24/22 1427 03/24/22 2045 03/25/22 0750  BP: 125/80 113/70 131/68   Pulse: (!) 109 (!) 101 (!) 108   Resp:  18 15   Temp:  98 F (36.7 C) 98.8 F (37.1 C) 98.9 F (37.2 C)  TempSrc:   Oral Oral  SpO2: 96% 93% 97%   Weight:      Height:        Intake/Output Summary (Last 24 hours) at 03/25/2022 1406 Last data filed at 03/24/2022 2100 Gross per 24 hour  Intake 240 ml  Output 400 ml  Net -160 ml     Wt Readings from Last 3 Encounters:  03/22/22 103.4 kg  11/28/17 107.5 kg  11/21/17 107.5 kg    Physical Exam General: Alert and oriented x 3, NAD Cardiovascular: S1 S2 clear, RRR. Respiratory: CTAB, no wheezing, rales or rhonchi Gastrointestinal: Soft, nontender, nondistended, NBS Ext: no pedal edema bilaterally Neuro: no new deficits Psych: Normal affect and demeanor, alert and  oriented x3     Data Reviewed:  I have personally reviewed following labs    CBC Lab Results  Component Value Date   WBC 10.3 03/25/2022   RBC 3.65 (L) 03/25/2022   HGB 11.3 (L) 03/25/2022   HCT 33.3 (L) 03/25/2022   MCV 91.2 03/25/2022   MCH 31.0 03/25/2022   PLT 189 03/25/2022   MCHC 33.9 03/25/2022   RDW 13.9 03/25/2022   LYMPHSABS 1.5 03/25/2022   MONOABS 1.3 (H) 03/25/2022   EOSABS 0.3 03/25/2022   BASOSABS 0.0 46/50/3546     Last metabolic panel Lab Results  Component Value Date   NA 132 (L) 03/25/2022   K 3.9 03/25/2022   CL 100 03/25/2022   CO2 27 03/25/2022   BUN 20 03/25/2022   CREATININE 0.84 03/25/2022   GLUCOSE 114 (H) 03/25/2022   GFRNONAA >60 03/25/2022   GFRAA >60 04/19/2019   CALCIUM 8.0 (L) 03/25/2022   PHOS 2.6 03/21/2022   PROT 6.0 (L) 03/21/2022   ALBUMIN 3.1 (L) 03/21/2022   BILITOT 0.8 03/21/2022   ALKPHOS 84 03/21/2022   AST 20 03/21/2022   ALT 24 03/21/2022   ANIONGAP 5 03/25/2022    CBG (last 3)  No results for input(s): "GLUCAP" in the last 72 hours.    Coagulation Profile: Recent Labs  Lab 03/21/22 1756  INR 1.1     Radiology Studies: I have personally reviewed the imaging studies  No results found.     Estill Cotta M.D. [facility] 03/25/2022, 2:06 PM  Available via Epic secure chat 7am-7pm After 7 pm, please refer to night coverage provider listed on amion.

## 2022-03-26 DIAGNOSIS — I10 Essential (primary) hypertension: Secondary | ICD-10-CM | POA: Diagnosis not present

## 2022-03-26 DIAGNOSIS — R338 Other retention of urine: Secondary | ICD-10-CM | POA: Diagnosis not present

## 2022-03-26 DIAGNOSIS — S72002A Fracture of unspecified part of neck of left femur, initial encounter for closed fracture: Secondary | ICD-10-CM | POA: Diagnosis not present

## 2022-03-26 NOTE — Progress Notes (Signed)
[facility]                                                                              Alex [name], is a 71 y.o. 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  Imaging showed left hip fracture Orthopedics was consulted, underwent left total hip repair on 7/24    Assessment & Plan    Principal Problem: Mechanical fall with closed left hip fracture ([place]) -Underwent left total hip repair anterior approach, Dr. Alvan Dame on 7/24 -Pain controlled, continue Xarelto -PT recommendation for SNF short-term rehab, awaiting SNF authorization -No acute issues  Active Problems: Recurrent history of DVT/PE  -Xarelto resumed    Essential hypertension -BP stable, continue Lopressor    Hyperlipidemia -Continue atorvastatin    Acute urinary retention, history of BPH -Continue Flomax, finasteride  Sinus tachycardia -EKG on admission showed sinus tachycardia, no chest pain or acute shortness of breath. -2D echo on 7/24 showed EF of 60 to 65%, no regional wall motion abnormalities -Improved, continue Lopressor  Constipation -Resolved  Code Status: Full code DVT Prophylaxis:  rivaroxaban (XARELTO) tablet 10 mg Start: 03/23/22 1115 SCDs Start: 03/23/22 0026 Place TED hose Start: 03/23/22 0026 Place and maintain sequential compression device Start: 03/22/22 1153 SCDs Start: 03/21/22 1940 rivaroxaban (XARELTO) tablet 10 mg   Level of Care: Level of care: Progressive Family Communication: Updated patient   Disposition Plan:      Remains inpatient appropriate: Awaiting SNF  authorization patient medically ready   Procedures:  7/24 left total hip replacement through an anterior approach   Consultants:   Orthopedics  Antimicrobials:   Anti-infectives (From admission, onward)    Start     Dose/Rate Route Frequency Ordered Stop   03/23/22 0600  ceFAZolin (ANCEF) IVPB 2g/100 mL premix        2 g 200 mL/hr over 30 Minutes Intravenous On call to O.R. 03/22/22 1419 03/22/22 1700   03/22/22 1915  ceFAZolin (ANCEF) IVPB 2g/100 mL premix        2 g 200 mL/hr over 30 Minutes Intravenous Every 6 hours 03/22/22 1907 03/23/22 0052          Medications  atorvastatin  20 mg Oral Daily   Chlorhexidine Gluconate Cloth  6 each Topical Daily   docusate sodium  100 mg Oral BID   ferrous sulfate  325 mg Oral TID PC   finasteride  5 mg Oral Daily   metoprolol tartrate  12.5 mg Oral BID   rivaroxaban  10 mg Oral Daily   tamsulosin  0.4 mg Oral QPC breakfast      Subjective:   Alex [name] was seen and examined today.  No acute complaints, medically ready, awaiting skilled nursing facility.   Objective:   Vitals:   03/26/22 0430 03/26/22 0630 03/26/22 0756 03/26/22 1451  BP: 111/70 126/69 111/71 119/72  Pulse: 85 87 78 (!) 102  Resp: 20 20    Temp:   98.4 F (36.9 C) 98 F (36.7 C)  TempSrc:   Oral Oral  SpO2: 97% 90% 96% 97%  Weight:      Height:        Intake/Output Summary (Last 24 hours) at 03/26/2022 1523 Last data filed at 03/26/2022 1030 Gross per 24 hour  Intake 240 ml  Output 150 ml  Net 90 ml     Wt Readings from Last 3 Encounters:  03/22/22 103.4 kg  11/28/17 107.5 kg  11/21/17 107.5 kg   Physical Exam General: Alert and oriented x 3, NAD Cardiovascular: S1 S2 clear, RRR.  Respiratory: CTAB, no wheezing, rales or rhonchi Gastrointestinal: Soft, nontender, nondistended, NBS Ext: no pedal edema bilaterally Psych: Normal affect and demeanor, alert and oriented x3   Data Reviewed:  I have personally reviewed following labs     CBC Lab Results  Component Value Date   WBC 10.3 03/25/2022   RBC 3.65 (L) 03/25/2022   HGB 11.3 (L) 03/25/2022   HCT 33.3 (L) 03/25/2022   MCV 91.2 03/25/2022   MCH 31.0 03/25/2022   PLT 189 03/25/2022   MCHC 33.9 03/25/2022   RDW 13.9 03/25/2022   LYMPHSABS 1.5 03/25/2022   MONOABS 1.3 (H) 03/25/2022   EOSABS 0.3 03/25/2022   BASOSABS 0.0 04/12/4817     Last metabolic panel Lab Results  Component Value Date   NA 132 (L) 03/25/2022   K 3.9 03/25/2022   CL 100 03/25/2022   CO2 27 03/25/2022   BUN 20 03/25/2022   CREATININE 0.84 03/25/2022   GLUCOSE 114 (H) 03/25/2022   GFRNONAA >60 03/25/2022   GFRAA >60 04/19/2019   CALCIUM 8.0 (L) 03/25/2022   PHOS 2.6 03/21/2022   PROT 6.0 (L) 03/21/2022   ALBUMIN 3.1 (L) 03/21/2022   BILITOT 0.8 03/21/2022   ALKPHOS 84 03/21/2022   AST 20 03/21/2022   ALT 24 03/21/2022   ANIONGAP 5 03/25/2022    CBG (last 3)  No results for input(s): "GLUCAP" in the last 72 hours.    Coagulation Profile: Recent Labs  Lab 03/21/22 1756  INR 1.1     Radiology Studies: I have personally reviewed the imaging studies  No results found.     Estill Cotta M.D. [facility] 03/26/2022, 3:23 PM  Available via Epic secure chat 7am-7pm After 7 pm, please refer to night coverage provider listed on amion.

## 2022-03-26 NOTE — TOC Progression Note (Addendum)
Transition of Care [facility]) - Progression Note    Patient Details  Name: Alex [name] MRN: [id number] Date of Birth: 1950-09-02  Transition of Care [facility]) CM/SW Contact  Joanne Chars, LCSW Phone Number: 03/26/2022, 1:04 PM  Clinical Narrative:   Message from [facility] that she had trouble getting auth submitted yesterday due to repeatedly getting disconnected.  She is attempting again today.   1415: TC Christine, Josem Kaufmann has been submitted, she spoke with someone at [facility], looks like it will be several days.   Expected Discharge Plan: [place] Barriers to Discharge: Continued Medical Work up, SNF Pending bed offer  Expected Discharge Plan and Services Expected Discharge Plan: [place] In-house Referral: Clinical Social Work   Post Acute Care Choice: [place] Choice Living arrangements for the past 2 months: Single Family Home                                       Social Determinants of Health (SDOH) Interventions    Readmission Risk Interventions     No data to display

## 2022-03-26 NOTE — Progress Notes (Signed)
Physical Therapy Treatment Patient Details Name: Alex [name] MRN: [id number] DOB: [birth year] Today's Date: 03/26/2022   History of Present Illness 71 y/o male admitted post accidental fall at restaurant on 03/21/22 and found to have hip fracture; s/p L THA for surgical fixation, WBAT. PMH: DVT (deep venous thrombosis) ([place]), Headache, Hypercholesterolemia, and Hypertension, ACDF 2019    PT Comments    Patient making good progress towards physical therapy goals. Patient able to progress to ambulation this session with RW. Easily fatigued requiring seated rest break with HR up to 130 with mobility. Encouraged patient to get up to [facility] or walk into bathroom with nursing staff to continue improving mobility. Continue to recommend SNF for ongoing Physical Therapy.       Recommendations for follow up therapy are one component of a multi-disciplinary discharge planning process, led by the attending physician.  Recommendations may be updated based on patient status, additional functional criteria and insurance authorization.  Follow Up Recommendations  Skilled nursing-short term rehab (<3 hours/day) Can patient physically be transported by private vehicle: No   Assistance Recommended at Discharge Frequent or constant Supervision/Assistance  Patient can return home with the following Two people to help with walking and/or transfers;Two people to help with bathing/dressing/bathroom   Equipment Recommendations  BSC/3in1    Recommendations for Other Services       Precautions / Restrictions Precautions Precautions: Back;Fall Precaution Comments: Recent back injury, and follows back precautions for comfort; Still, with L hip pain, log rolling is quite difficult Required Braces or Orthoses: Spinal Brace Spinal Brace: Lumbar corset;Applied in sitting position Restrictions Weight Bearing Restrictions: Yes LLE Weight Bearing: Weight bearing as tolerated     Mobility  Bed Mobility Overal bed  mobility: Needs Assistance Bed Mobility: Supine to Sit     Supine to sit: Mod assist     General bed mobility comments: assist to bring LE over towards EOB and trunk elevation    Transfers Overall transfer level: Needs assistance Equipment used: Rolling Alex [name] (2 wheels) Transfers: Sit to/from Stand, Bed to chair/wheelchair/BSC Sit to Stand: Min assist, +2 physical assistance, +2 safety/equipment   Step pivot transfers: Min assist, +2 physical assistance, +2 safety/equipment       General transfer comment: minA+2 to stand from EOB and BSC with cues for hand placement. Able to perform steps towards [facility] with no success at [facility]    Ambulation/Gait Ambulation/Gait assistance: Min guard, +2 safety/equipment Gait Distance (Feet): 20 Feet Assistive device: Rolling Rohn Fritsch (2 wheels) Gait Pattern/deviations: Step-to pattern, Decreased stride length, Decreased stance time - left, Decreased weight shift to left Gait velocity: decreased     General Gait Details: cues for forward gaze and upright posture. Min guard with chair follow. Patient easily fatigued and unable to continue   Stairs             Wheelchair Mobility    Modified Rankin (Stroke Patients Only)       Balance Overall balance assessment: Needs assistance Sitting-balance support: Bilateral upper extremity supported, Feet supported Sitting balance-Leahy Scale: Fair     Standing balance support: Bilateral upper extremity supported, Reliant on assistive device for balance Standing balance-Leahy Scale: Poor                              Cognition Arousal/Alertness: Awake/alert Behavior During Therapy: WFL for tasks assessed/performed, Anxious Overall Cognitive Status: No family/caregiver present to determine baseline cognitive functioning Area of Impairment: Attention, Memory,  Following commands, Problem solving                   Current Attention Level: Sustained Memory: Decreased  short-term memory Following Commands: Follows one step commands with increased time     Problem Solving: Difficulty sequencing, Requires verbal cues, Requires tactile cues          Exercises      General Comments        Pertinent Vitals/Pain Pain Assessment Pain Assessment: Faces Faces Pain Scale: Hurts little more Pain Location: L hip Pain Descriptors / Indicators: Aching, Grimacing, Guarding Pain Intervention(s): Monitored during session    Home Living                          Prior Function            PT Goals (current goals can now be found in the care plan section) Acute Rehab PT Goals Patient Stated Goal: Did not state PT Goal Formulation: With patient Time For Goal Achievement: 04/06/22 Potential to Achieve Goals: Good Progress towards PT goals: Progressing toward goals    Frequency    Min 3X/week      PT Plan Current plan remains appropriate    Co-evaluation              AM-PAC PT "6 Clicks" Mobility   Outcome Measure  Help needed turning from your back to your side while in a flat bed without using bedrails?: A Lot Help needed moving from lying on your back to sitting on the side of a flat bed without using bedrails?: Total Help needed moving to and from a bed to a chair (including a wheelchair)?: Total Help needed standing up from a chair using your arms (e.g., wheelchair or bedside chair)?: Total Help needed to walk in hospital room?: Total Help needed climbing 3-5 steps with a railing? : Total 6 Click Score: 7    End of Session Equipment Utilized During Treatment: Gait belt;Back brace Activity Tolerance: Patient tolerated treatment well Patient left: in chair;with call bell/phone within reach Nurse Communication: Mobility status PT Visit Diagnosis: Unsteadiness on feet (R26.81);Other abnormalities of gait and mobility (R26.89);History of falling (Z91.81);Muscle weakness (generalized) (M62.81);Pain Pain - Right/Left:  Left Pain - part of body: Hip     Time: 9735-3299 PT Time Calculation (min) (ACUTE ONLY): 24 min  Charges:  $Gait Training: 8-22 mins $Therapeutic Activity: 8-22 mins                     Alex [name] A. Gilford Rile PT, DPT Acute Rehabilitation Services Office [phone]    Alex [name] 03/26/2022, 3:04 PM

## 2022-03-27 DIAGNOSIS — R338 Other retention of urine: Secondary | ICD-10-CM | POA: Diagnosis not present

## 2022-03-27 DIAGNOSIS — I10 Essential (primary) hypertension: Secondary | ICD-10-CM | POA: Diagnosis not present

## 2022-03-27 DIAGNOSIS — S72002A Fracture of unspecified part of neck of left femur, initial encounter for closed fracture: Secondary | ICD-10-CM | POA: Diagnosis not present

## 2022-03-27 DIAGNOSIS — E7800 Pure hypercholesterolemia, unspecified: Secondary | ICD-10-CM | POA: Diagnosis not present

## 2022-03-27 NOTE — Progress Notes (Signed)
[facility]                                                                              Alex [name], is a 71 y.o. 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  Imaging showed left hip fracture Orthopedics was consulted, underwent left total hip repair on 7/24    Assessment & Plan    Principal Problem: Mechanical fall with closed left hip fracture ([place]) -Underwent left total hip repair anterior approach, Dr. Alvan Dame on 7/24 -Pain controlled, continue Xarelto -No acute issues, awaiting SNF authorization, medically ready  Active Problems: Recurrent history of DVT/PE  -Xarelto resumed    Essential hypertension -BP stable, continue Lopressor    Hyperlipidemia -Continue atorvastatin    Acute urinary retention, history of BPH -Continue Flomax, finasteride  Sinus tachycardia -EKG on admission showed sinus tachycardia, no chest pain or acute shortness of breath. -2D echo on 7/24 showed EF of 60 to 65%, no regional wall motion abnormalities -Improved, continue Lopressor  Constipation -Resolved  Code Status: Full code DVT Prophylaxis:  rivaroxaban (XARELTO) tablet 10 mg Start: 03/23/22 1115 SCDs Start: 03/23/22 0026 Place TED hose Start: 03/23/22 0026 Place and maintain sequential compression device Start: 03/22/22 1153 SCDs Start: 03/21/22 1940 rivaroxaban (XARELTO) tablet 10 mg   Level of Care: Level of care: Progressive Family Communication: Updated patient   Disposition Plan:      Remains inpatient appropriate: Awaiting SNF authorization patient medically  ready   Procedures:  7/24 left total hip replacement through an anterior approach   Consultants:   Orthopedics  Antimicrobials:   Anti-infectives (From admission, onward)    Start     Dose/Rate Route Frequency Ordered Stop   03/23/22 0600  ceFAZolin (ANCEF) IVPB 2g/100 mL premix        2 g 200 mL/hr over 30 Minutes Intravenous On call to O.R. 03/22/22 1419 03/22/22 1700   03/22/22 1915  ceFAZolin (ANCEF) IVPB 2g/100 mL premix        2 g 200 mL/hr over 30 Minutes Intravenous Every 6 hours 03/22/22 1907 03/23/22 0052          Medications  atorvastatin  20 mg Oral Daily   Chlorhexidine Gluconate Cloth  6 each Topical Daily   docusate sodium  100 mg Oral BID   ferrous sulfate  325 mg Oral TID PC   finasteride  5 mg Oral Daily   metoprolol tartrate  12.5 mg Oral BID   rivaroxaban  10 mg Oral Daily   tamsulosin  0.4 mg Oral QPC breakfast      Subjective:   Alex [name] was seen and examined today.  No complaints, awaiting  skilled nursing facility.  Feeling warm in the room otherwise no acute nausea vomiting chest pain or shortness of breath.  No constipation.  Objective:   Vitals:   03/26/22 1451 03/26/22 2016 03/27/22 0405 03/27/22 0725  BP: 119/72 128/84 131/77 124/79  Pulse: (!) 102 (!) 111 90 93  Resp:  19 15   Temp: 98 F (36.7 C)  98.5 F (36.9 C) 97.9 F (36.6 C)  TempSrc: Oral Oral  Oral  SpO2: 97% 95% 96% 98%  Weight:      Height:        Intake/Output Summary (Last 24 hours) at 03/27/2022 1312 Last data filed at 03/27/2022 0900 Gross per 24 hour  Intake 960 ml  Output 900 ml  Net 60 ml     Wt Readings from Last 3 Encounters:  03/22/22 103.4 kg  11/28/17 107.5 kg  11/21/17 107.5 kg    Physical Exam General: Alert and oriented x 3, NAD Cardiovascular: S1 S2 clear, RRR Respiratory: CTAB Gastrointestinal: Soft, nontender, nondistended, NBS Ext: no pedal edema bilaterally Psych: Normal affect and demeanor, alert and oriented x3   Data  Reviewed:  I have personally reviewed following labs    CBC Lab Results  Component Value Date   WBC 10.3 03/25/2022   RBC 3.65 (L) 03/25/2022   HGB 11.3 (L) 03/25/2022   HCT 33.3 (L) 03/25/2022   MCV 91.2 03/25/2022   MCH 31.0 03/25/2022   PLT 189 03/25/2022   MCHC 33.9 03/25/2022   RDW 13.9 03/25/2022   LYMPHSABS 1.5 03/25/2022   MONOABS 1.3 (H) 03/25/2022   EOSABS 0.3 03/25/2022   BASOSABS 0.0 30/86/5784     Last metabolic panel Lab Results  Component Value Date   NA 132 (L) 03/25/2022   K 3.9 03/25/2022   CL 100 03/25/2022   CO2 27 03/25/2022   BUN 20 03/25/2022   CREATININE 0.84 03/25/2022   GLUCOSE 114 (H) 03/25/2022   GFRNONAA >60 03/25/2022   GFRAA >60 04/19/2019   CALCIUM 8.0 (L) 03/25/2022   PHOS 2.6 03/21/2022   PROT 6.0 (L) 03/21/2022   ALBUMIN 3.1 (L) 03/21/2022   BILITOT 0.8 03/21/2022   ALKPHOS 84 03/21/2022   AST 20 03/21/2022   ALT 24 03/21/2022   ANIONGAP 5 03/25/2022    CBG (last 3)  No results for input(s): "GLUCAP" in the last 72 hours.    Coagulation Profile: Recent Labs  Lab 03/21/22 1756  INR 1.1     Radiology Studies: I have personally reviewed the imaging studies  No results found.     Estill Cotta M.D. [facility] 03/27/2022, 1:12 PM  Available via Epic secure chat 7am-7pm After 7 pm, please refer to night coverage provider listed on amion.

## 2022-03-27 NOTE — Plan of Care (Signed)

## 2022-03-28 DIAGNOSIS — S72002A Fracture of unspecified part of neck of left femur, initial encounter for closed fracture: Secondary | ICD-10-CM | POA: Diagnosis not present

## 2022-03-28 DIAGNOSIS — I10 Essential (primary) hypertension: Secondary | ICD-10-CM | POA: Diagnosis not present

## 2022-03-28 DIAGNOSIS — R338 Other retention of urine: Secondary | ICD-10-CM | POA: Diagnosis not present

## 2022-03-28 NOTE — Progress Notes (Signed)
[facility]                                                                              Alex [name], is a 71 y.o. 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  Imaging showed left hip fracture Orthopedics was consulted, underwent left total hip repair on 7/24    Assessment & Plan    Principal Problem: Mechanical fall with closed left hip fracture ([place]) -Underwent left total hip repair anterior approach, Dr. Alvan Dame on 7/24 -Pain controlled, continue Xarelto -No acute issues, awaiting SNF authorization, medically ready  Active Problems: Recurrent history of DVT/PE  -Xarelto resumed -Follow CBC in a.m.    Essential hypertension -Continue Lopressor, BP stable    Hyperlipidemia -Continue atorvastatin    Acute urinary retention, history of BPH -Continue Flomax, finasteride  Sinus tachycardia -EKG on admission showed sinus tachycardia, no chest pain or acute shortness of breath. -2D echo on 7/24 showed EF of 60 to 65%, no regional wall motion abnormalities -Improved, continue Lopressor  Constipation -Resolved  Code Status: Full code DVT Prophylaxis:  rivaroxaban (XARELTO) tablet 10 mg Start: 03/23/22 1115 SCDs Start: 03/23/22 0026 Place TED hose Start: 03/23/22 0026 Place and maintain sequential compression device Start: 03/22/22 1153 SCDs Start: 03/21/22 1940 rivaroxaban (XARELTO) tablet 10 mg   Level of Care: Level of care: Progressive Family Communication: Updated patient   Disposition Plan:      Remains inpatient appropriate: Awaiting SNF  authorization patient medically ready   Procedures:  7/24 left total hip replacement through an anterior approach   Consultants:   Orthopedics  Antimicrobials:   Anti-infectives (From admission, onward)    Start     Dose/Rate Route Frequency Ordered Stop   03/23/22 0600  ceFAZolin (ANCEF) IVPB 2g/100 mL premix        2 g 200 mL/hr over 30 Minutes Intravenous On call to O.R. 03/22/22 1419 03/22/22 1700   03/22/22 1915  ceFAZolin (ANCEF) IVPB 2g/100 mL premix        2 g 200 mL/hr over 30 Minutes Intravenous Every 6 hours 03/22/22 1907 03/23/22 0052          Medications  atorvastatin  20 mg Oral Daily   Chlorhexidine Gluconate Cloth  6 each Topical Daily   docusate sodium  100 mg Oral BID   ferrous sulfate  325 mg Oral TID PC   finasteride  5 mg Oral Daily   metoprolol tartrate  12.5 mg Oral BID   rivaroxaban  10 mg Oral Daily   tamsulosin  0.4 mg Oral QPC breakfast      Subjective:   Alex [name] was seen and examined today.  No complaints, awaiting skilled nursing facility  Objective:   Vitals:   03/27/22 1521 03/27/22 1920 03/28/22 0300 03/28/22 0759  BP: 118/64 139/69 131/64 124/61  Pulse: 97 91 94 95  Resp:  20 20   Temp: 97.8 F (36.6 C) 98.4 F (36.9 C) 98.2 F (36.8 C) 98.2 F (36.8 C)  TempSrc: Oral Oral Oral Oral  SpO2: 98% 95% 96% 97%  Weight:      Height:        Intake/Output Summary (Last 24 hours) at 03/28/2022 1245 Last data filed at 03/27/2022 1920 Gross per 24 hour  Intake 240 ml  Output --  Net 240 ml     Wt Readings from Last 3 Encounters:  03/22/22 103.4 kg  11/28/17 107.5 kg  11/21/17 107.5 kg   Physical Exam General: Alert and oriented x 3, NAD, comfortable, pleasant Cardiovascular: S1 S2 clear, RRR. Respiratory: CTAB, no wheezing, rales or rhonchi Gastrointestinal: Soft, nontender, nondistended, NBS Ext: no pedal edema bilaterally Psych: Normal affect and demeanor, alert and oriented x3   Data Reviewed:  I have  personally reviewed following labs    CBC Lab Results  Component Value Date   WBC 10.3 03/25/2022   RBC 3.65 (L) 03/25/2022   HGB 11.3 (L) 03/25/2022   HCT 33.3 (L) 03/25/2022   MCV 91.2 03/25/2022   MCH 31.0 03/25/2022   PLT 189 03/25/2022   MCHC 33.9 03/25/2022   RDW 13.9 03/25/2022   LYMPHSABS 1.5 03/25/2022   MONOABS 1.3 (H) 03/25/2022   EOSABS 0.3 03/25/2022   BASOSABS 0.0 09/62/8366     Last metabolic panel Lab Results  Component Value Date   NA 132 (L) 03/25/2022   K 3.9 03/25/2022   CL 100 03/25/2022   CO2 27 03/25/2022   BUN 20 03/25/2022   CREATININE 0.84 03/25/2022   GLUCOSE 114 (H) 03/25/2022   GFRNONAA >60 03/25/2022   GFRAA >60 04/19/2019   CALCIUM 8.0 (L) 03/25/2022   PHOS 2.6 03/21/2022   PROT 6.0 (L) 03/21/2022   ALBUMIN 3.1 (L) 03/21/2022   BILITOT 0.8 03/21/2022   ALKPHOS 84 03/21/2022   AST 20 03/21/2022   ALT 24 03/21/2022   ANIONGAP 5 03/25/2022    CBG (last 3)  No results for input(s): "GLUCAP" in the last 72 hours.    Coagulation Profile: Recent Labs  Lab 03/21/22 1756  INR 1.1     Radiology Studies: I have personally reviewed the imaging studies  No results found.     Estill Cotta M.D. [facility] 03/28/2022, 12:45 PM  Available via Epic secure chat 7am-7pm After 7 pm, please refer to night coverage provider listed on amion.

## 2022-03-28 NOTE — Progress Notes (Signed)
   Subjective: 6 Days Post-Op Procedure(s) (LRB): ANTERIOR TOTAL HIP ARTHROPLASTY (Left)  Pt c/o mild weakness in the left hip/leg but otherwise doing well Denies any new symptoms or issues Plan for SNF placement Patient reports pain as mild.  Objective:   VITALS:   Vitals:   03/28/22 0300 03/28/22 0759  BP: 131/64 124/61  Pulse: 94 95  Resp: 20   Temp: 98.2 F (36.8 C) 98.2 F (36.8 C)  SpO2: 96% 97%    Left hip incision healing well No drainage or erythema Nv intact distally Slight decreased sensation to lateral thigh  LABS No results for input(s): "HGB", "HCT", "WBC", "PLT" in the last 72 hours.  No results for input(s): "NA", "K", "BUN", "CREATININE", "GLUCOSE" in the last 72 hours.   Assessment/Plan: 6 Days Post-Op Procedure(s) (LRB): ANTERIOR TOTAL HIP ARTHROPLASTY (Left) Continue PT/OT F/u in the office in 2 weeks Pain management as needed   Kathrynn Speed, [place] [facility] now [employer] [facility] [address], [place], [place], [place] [address] Phone: [phone] [email or web address] Facebook  [employer]

## 2022-03-29 DIAGNOSIS — I82409 Acute embolism and thrombosis of unspecified deep veins of unspecified lower extremity: Secondary | ICD-10-CM | POA: Diagnosis not present

## 2022-03-29 DIAGNOSIS — Z86718 Personal history of other venous thrombosis and embolism: Secondary | ICD-10-CM | POA: Diagnosis not present

## 2022-03-29 DIAGNOSIS — R5381 Other malaise: Secondary | ICD-10-CM | POA: Diagnosis not present

## 2022-03-29 DIAGNOSIS — R338 Other retention of urine: Secondary | ICD-10-CM | POA: Diagnosis not present

## 2022-03-29 DIAGNOSIS — M19012 Primary osteoarthritis, left shoulder: Secondary | ICD-10-CM | POA: Diagnosis not present

## 2022-03-29 DIAGNOSIS — M25512 Pain in left shoulder: Secondary | ICD-10-CM | POA: Diagnosis not present

## 2022-03-29 DIAGNOSIS — S72002A Fracture of unspecified part of neck of left femur, initial encounter for closed fracture: Secondary | ICD-10-CM | POA: Diagnosis not present

## 2022-03-29 DIAGNOSIS — L100 Pemphigus vulgaris: Secondary | ICD-10-CM | POA: Diagnosis not present

## 2022-03-29 DIAGNOSIS — M5020 Other cervical disc displacement, unspecified cervical region: Secondary | ICD-10-CM | POA: Diagnosis not present

## 2022-03-29 DIAGNOSIS — Z7401 Bed confinement status: Secondary | ICD-10-CM | POA: Diagnosis not present

## 2022-03-29 DIAGNOSIS — R531 Weakness: Secondary | ICD-10-CM | POA: Diagnosis not present

## 2022-03-29 DIAGNOSIS — E785 Hyperlipidemia, unspecified: Secondary | ICD-10-CM | POA: Diagnosis not present

## 2022-03-29 DIAGNOSIS — I10 Essential (primary) hypertension: Secondary | ICD-10-CM | POA: Diagnosis not present

## 2022-03-29 DIAGNOSIS — M7502 Adhesive capsulitis of left shoulder: Secondary | ICD-10-CM | POA: Diagnosis not present

## 2022-03-29 LAB — BASIC METABOLIC PANEL
Anion gap: 7 (ref 5–15)
BUN: 15 mg/dL (ref 8–23)
CO2: 26 mmol/L (ref 22–32)
Calcium: 8 mg/dL — ABNORMAL LOW (ref 8.9–10.3)
Chloride: 103 mmol/L (ref 98–111)
Creatinine, Ser: 0.79 mg/dL (ref 0.61–1.24)
GFR, Estimated: >60 mL/min (ref >60)
Glucose, Bld: 155 mg/dL — ABNORMAL HIGH (ref 70–99)
Potassium: 3.6 mmol/L (ref 3.5–5.1)
Sodium: 136 mmol/L (ref 135–145)

## 2022-03-29 LAB — CBC
HCT: 32.1 % — ABNORMAL LOW (ref 39.0–52.0)
Hemoglobin: 10.6 g/dL — ABNORMAL LOW (ref 13.0–17.0)
MCH: 30.6 pg (ref 26.0–34.0)
MCHC: 33 g/dL (ref 30.0–36.0)
MCV: 92.8 fL (ref 80.0–100.0)
Platelets: 273 10*3/uL (ref 150–400)
RBC: 3.46 MIL/uL — ABNORMAL LOW (ref 4.22–5.81)
RDW: 14.3 % (ref 11.5–15.5)
WBC: 10.8 10*3/uL — ABNORMAL HIGH (ref 4.0–10.5)
nRBC: 0 % (ref 0.0–0.2)

## 2022-03-29 MED ORDER — METOPROLOL TARTRATE 25 MG PO TABS: 12.5000 mg | ORAL_TABLET | Freq: Two times a day (BID) | ORAL | Status: AC

## 2022-03-29 MED ORDER — FERROUS SULFATE 325 (65 FE) MG PO TABS: 325.0000 mg | ORAL_TABLET | Freq: Three times a day (TID) | ORAL | 3 refills | Status: AC

## 2022-03-29 MED ORDER — ACETAMINOPHEN 325 MG PO TABS
325.0000 mg | ORAL_TABLET | Freq: Four times a day (QID) | ORAL | Status: AC | PRN
Start: 2022-03-29 — End: ?

## 2022-03-29 MED ORDER — POLYETHYLENE GLYCOL 3350 17 G PO PACK: 17.0000 g | PACK | Freq: Every day | ORAL | 0 refills | Status: AC | PRN

## 2022-03-29 MED ORDER — HYDROCODONE-ACETAMINOPHEN 5-325 MG PO TABS
1.0000 | ORAL_TABLET | Freq: Four times a day (QID) | ORAL | 0 refills | Status: DC | PRN
Start: 2022-03-29 — End: 2022-07-12

## 2022-03-29 MED ORDER — DOCUSATE SODIUM 100 MG PO CAPS: 100.0000 mg | ORAL_CAPSULE | Freq: Two times a day (BID) | ORAL | 0 refills | Status: AC

## 2022-03-29 NOTE — Discharge Summary (Signed)
Physician Discharge Summary   Patient: Alex [name] MRN: [id number] DOB: June 14, 1951  Admit date:     03/21/2022  Discharge date: 03/29/22  Discharge Physician: Estill Cotta, MD    PCP: Velna Hatchet, MD   Recommendations at discharge:   Weightbearing as tolerated, continue Xarelto Outpatient follow-up with Dr. 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  Imaging showed left hip fracture Orthopedics was consulted, underwent left total hip repair on 7/24  Assessment and Plan:  Mechanical fall with closed left hip fracture ([place]) -Underwent left total hip repair anterior approach, Dr. Alvan Dame on 7/24 -Pain controlled, continue Xarelto -Weightbearing as tolerated, outpatient follow-up with Dr. Paralee Cancel in 2 weeks    Recurrent history of DVT/PE  -Xarelto resumed -Hemoglobin stable, 10.6 on discharge     Essential hypertension -Continue Lopressor, BP stable     Hyperlipidemia -Continue atorvastatin     Acute urinary retention, history of BPH -Continue Flomax, finasteride   Sinus tachycardia -EKG on admission showed sinus tachycardia, no chest pain or acute shortness of breath. -2D echo on 7/24 showed EF of 60 to 65%, no regional wall motion abnormalities -Improved, continue Lopressor   Constipation -Resolved, continue bowel regimen       Pain control - [facility] Controlled Substance Reporting System database was reviewed. and patient was instructed, not to drive, operate heavy machinery, perform activities at heights, swimming or participation in water  activities or provide baby-sitting services while on Pain, Sleep and Anxiety Medications; until their outpatient Physician has advised to do so again. Also recommended to not to take more than prescribed Pain, Sleep and Anxiety Medications.  Consultants: Orthopedics, Dr. Alvan Dame Procedures performed:  7/24 left total hip replacement through an anterior approach  Disposition: Skilled nursing facility Diet recommendation:  Discharge Diet Orders (From admission, onward)     Start     Ordered   03/29/22 0000  Diet - low sodium heart healthy        03/29/22 0949   03/29/22 0000  Diet general        03/29/22 0949           Regular diet DISCHARGE MEDICATION: Allergies as of 03/29/2022       Reactions   Vancomycin Hives        Medication List     STOP taking these medications    traMADol 50 MG tablet Commonly known as: ULTRAM       TAKE these medications    acetaminophen 325 MG tablet Commonly known as: TYLENOL Take 1-2 tablets (325-650 mg total) by mouth every 6 (six) hours as needed for mild pain (pain score 1-3 or temp > 100.5).   atorvastatin 20 MG tablet Commonly known as: LIPITOR Take 20 mg by mouth at bedtime.   cyclobenzaprine 10 MG tablet Commonly known as: FLEXERIL Take 10 mg by mouth at bedtime.   docusate sodium 100 MG capsule Commonly known as: COLACE Take 1 capsule (100 mg total) by mouth 2 (two) times daily.   ferrous sulfate 325 (65 FE) MG tablet Take  1 tablet (325 mg total) by mouth 3 (three) times daily after meals.   finasteride 5 MG tablet Commonly known as: PROSCAR Take 5 mg by mouth daily.   HYDROcodone-acetaminophen 5-325 MG tablet Commonly known as: NORCO/VICODIN Take 1-2 tablets by mouth every 6 (six) hours as needed (pain). What changed: when to take this   lisinopril 10 MG tablet Commonly known as: ZESTRIL Take 10 mg by mouth every evening.   methocarbamol 500 MG tablet Commonly known as: ROBAXIN Take 500-1,500 mg by mouth  every 8 (eight) hours as needed for muscle spasms.   metoprolol tartrate 25 MG tablet Commonly known as: LOPRESSOR Take 0.5 tablets (12.5 mg total) by mouth 2 (two) times daily.   polyethylene glycol 17 g packet Commonly known as: MIRALAX / GLYCOLAX Take 17 g by mouth daily as needed for mild constipation.   rivaroxaban 10 MG Tabs tablet Commonly known as: XARELTO Take 10 mg by mouth daily.   silodosin 8 MG Caps capsule Commonly known as: RAPAFLO Take 8 mg by mouth daily.               Discharge Care Instructions  (From admission, onward)           Start     Ordered   03/29/22 0000  If the dressing is still on your incision site when you go home, remove it on the third day after your surgery date. Remove dressing if it begins to fall off, or if it is dirty or damaged before the third day.        03/29/22 5361            Contact information for follow-up providers     Paralee Cancel, MD. Schedule an appointment as soon as possible for a visit in 2 week(s).   Specialty: Orthopedic Surgery Contact information: [address] [place] [address] [phone]              Contact information for after-discharge care     Destination     [facility] .   Service: Skilled Nursing Contact information: [address] [place] [phone]                    Discharge Exam: Filed Weights   03/21/22 1747 03/22/22 1446  Weight: 103.4 kg 103.4 kg   S: No acute complaints, awaiting skilled nursing facility  Vitals:   03/28/22 1950 03/29/22 0119 03/29/22 0420 03/29/22 0805  BP: 120/69 125/67 114/63 (!) 146/74  Pulse: 92 92 91 98  Resp: '18 18 16 17  '$ Temp: 98 F (36.7 C) 98.4 F (36.9 C) 98.7 F (37.1 C) 98.2 F (36.8 C)  TempSrc: Oral Oral Oral   SpO2: 97% 97% 96% 97%  Weight:      Height:        Physical Exam General: Alert and oriented x 3, NAD Cardiovascular: S1  S2 clear, RRR. No pedal edema b/l Respiratory: CTAB, no wheezing, rales or rhonchi Gastrointestinal: Soft, nontender, nondistended, NBS Ext: no pedal edema bilaterally Psych: Normal affect and demeanor, alert and oriented x3    Condition at discharge: fair  The results of significant diagnostics from this hospitalization (including imaging, microbiology, ancillary and laboratory) are listed below for reference.   Imaging Studies: DG Pelvis Portable  Result Date: 03/22/2022 CLINICAL DATA:  [id number]. Post op films for left total hip arthroplasty EXAM: PORTABLE PELVIS 1-2 VIEWS COMPARISON:  None Available. FINDINGS:  Total left hip arthroplasty. There is no evidence of pelvic fracture or diastasis. At least mild degenerative changes of the right hip. No pelvic bone lesions are seen. Subcutaneus soft tissue edema and emphysema overlying the left hip consistent with postsurgical changes. IMPRESSION: Status post total left hip arthroplasty. Electronically Signed   By: Iven Finn M.D.   On: 03/22/2022 19:43   DG HIP UNILAT WITH PELVIS 1V LEFT  Result Date: 03/22/2022 CLINICAL DATA:  Anterior total hip arthroplasty EXAM: DG HIP (WITH OR WITHOUT PELVIS) 1V*L* COMPARISON:  03/21/2022 FINDINGS: Intraoperative fluoroscopy is obtained for surgical control purposes. Fluoroscopy time is recorded at 16 seconds. Dose 2.73 mGy. A single spot fluoroscopic images provided. Spot fluoroscopic image demonstrates placement of a left hip arthroplasty, incompletely included within the field of view. IMPRESSION: Intraoperative fluoroscopy obtained for surgical control purposes during placement of left hip arthroplasty. Electronically Signed   By: Lucienne Capers M.D.   On: 03/22/2022 19:08   DG C-Arm 1-60 Min-No Report  Result Date: 03/22/2022 Fluoroscopy was utilized by the requesting physician.  No radiographic interpretation.   ECHOCARDIOGRAM COMPLETE  Result Date: 03/22/2022    ECHOCARDIOGRAM REPORT   Patient  Name:   EBRAHIM DEREMER Date of Exam: 03/22/2022 Medical Rec #:  [id number]     Height:       74.0 in Accession #:    [id number]    Weight:       228.0 lb Date of Birth:  1951/07/01      BSA:          2.298 m Patient Age:    71 years      BP:           150/96 mmHg Patient Gender: M             HR:           102 bpm. Exam Location:  Inpatient Procedure: 2D Echo, Cardiac Doppler, Color Doppler and Intracardiac            Opacification Agent Indications:    R94.31 Abnormal EKG  History:        Patient has no prior history of Echocardiogram examinations.                 Risk Factors:Hypertension.  Sonographer:    Bernadene Person RDCS Referring Phys: [id number] ANASTASSIA DOUTOVA  Sonographer Comments: Technically difficult study due to poor echo windows. Image acquisition challenging due to respiratory motion. Pt laying on back for echo, unable to turn on side. IMPRESSIONS  1. Limited images patient unable to turn on side.  2. Left ventricular ejection fraction, by estimation, is 60 to 65%. The left ventricle has normal function. The left ventricle has no regional wall motion abnormalities. Left ventricular diastolic parameters are indeterminate.  3. Right ventricular systolic function is normal. The right ventricular size is normal.  4. The mitral valve is normal in structure. No evidence of mitral valve regurgitation. No evidence of mitral stenosis.  5. The aortic valve is normal in structure. Aortic valve regurgitation is not visualized. No aortic stenosis is present.  6. The inferior vena cava is normal in size with greater than 50% respiratory variability, suggesting right atrial pressure of 3 mmHg. FINDINGS  Left Ventricle: Left ventricular ejection fraction, by estimation, is 60 to 65%. The left ventricle has normal function. The left ventricle has no regional wall motion abnormalities. Definity contrast agent was given IV to delineate the left ventricular  endocardial borders. The left  ventricular internal cavity size was  normal in size. There is no left ventricular hypertrophy. Left ventricular diastolic parameters are indeterminate. Right Ventricle: The right ventricular size is normal. No increase in right ventricular wall thickness. Right ventricular systolic function is normal. Left Atrium: Left atrial size was normal in size. Right Atrium: Right atrial size was normal in size. Pericardium: There is no evidence of pericardial effusion. Mitral Valve: The mitral valve is normal in structure. No evidence of mitral valve regurgitation. No evidence of mitral valve stenosis. Tricuspid Valve: The tricuspid valve is normal in structure. Tricuspid valve regurgitation is not demonstrated. No evidence of tricuspid stenosis. Aortic Valve: The aortic valve is normal in structure. Aortic valve regurgitation is not visualized. No aortic stenosis is present. Pulmonic Valve: The pulmonic valve was normal in structure. Pulmonic valve regurgitation is not visualized. No evidence of pulmonic stenosis. Aorta: The aortic root is normal in size and structure. Venous: The inferior vena cava is normal in size with greater than 50% respiratory variability, suggesting right atrial pressure of 3 mmHg. IAS/Shunts: No atrial level shunt detected by color flow Doppler. Additional Comments: Limited images patient unable to turn on side.  LEFT VENTRICLE PLAX 2D LVIDd:         4.30 cm   Diastology LVIDs:         3.50 cm   LV e' medial:    7.38 cm/s LV PW:         0.90 cm   LV E/e' medial:  7.4 LV IVS:        1.00 cm   LV e' lateral:   10.10 cm/s LVOT diam:     2.10 cm   LV E/e' lateral: 5.4 LV SV:         59 LV SV Index:   26 LVOT Area:     3.46 cm  RIGHT VENTRICLE RV S prime:     13.60 cm/s TAPSE (M-mode): 1.8 cm LEFT ATRIUM             Index        RIGHT ATRIUM           Index LA diam:        3.80 cm 1.65 cm/m   RA Area:     12.00 cm LA Vol (A2C):   43.3 ml 18.84 ml/m  RA Volume:   24.40 ml  10.62 ml/m LA Vol (A4C):   46.1 ml 20.06 ml/m LA Biplane Vol:  45.6 ml 19.84 ml/m  AORTIC VALVE LVOT Vmax:   114.00 cm/s LVOT Vmean:  72.600 cm/s LVOT VTI:    0.170 m  AORTA Ao Root diam: 3.00 cm Ao Asc diam:  3.60 cm MITRAL VALVE MV Area (PHT): 4.49 cm    SHUNTS MV Decel Time: 169 msec    Systemic VTI:  0.17 m MV E velocity: 54.50 cm/s  Systemic Diam: 2.10 cm MV A velocity: 69.20 cm/s MV E/A ratio:  0.79 Jenkins Rouge MD Electronically signed by Jenkins Rouge MD Signature Date/Time: 03/22/2022/10:33:36 AM    Final    DG CHEST PORT 1 VIEW  Result Date: 03/21/2022 CLINICAL DATA:  Preop chest x-ray.  Fall. EXAM: PORTABLE CHEST 1 VIEW COMPARISON:  None Available. FINDINGS: The heart size and mediastinal contours are within normal limits. There is atherosclerotic calcification of the aorta. No consolidation, effusion, or pneumothorax. Cervical spinal fusion hardware is noted. No acute osseous abnormality. IMPRESSION: No active disease. Electronically Signed   By: Regan Rakers.D.  On: 03/21/2022 20:12   DG HIP UNILAT WITH PELVIS 2-3 VIEWS LEFT  Result Date: 03/21/2022 CLINICAL DATA:  Trauma due to a fall. EXAM: DG HIP (WITH OR WITHOUT PELVIS) 2-3V LEFT COMPARISON:  None Available. FINDINGS: Patient positioning limits examination. There appears to be a transverse fracture of the left femoral neck with varus angulation of the fracture fragment. Pelvis appears grossly intact. SI joints and symphysis pubis are not displaced. Soft tissues are unremarkable. Vascular calcifications. IMPRESSION: Transverse fracture of the left femoral neck with varus angulation. Electronically Signed   By: Lucienne Capers M.D.   On: 03/21/2022 18:08    Microbiology: Results for orders placed or performed during the hospital encounter of 03/21/22  Surgical pcr screen     Status: None   Collection Time: 03/22/22  2:08 PM   Specimen: Nasal Mucosa; Nasal Swab  Result Value Ref Range Status   MRSA, PCR NEGATIVE NEGATIVE Final   Staphylococcus aureus NEGATIVE NEGATIVE Final    Comment:  (NOTE) The Xpert SA Assay (FDA approved for NASAL specimens in patients 26 years of age and older), is one component of a comprehensive surveillance program. It is not intended to diagnose infection nor to guide or monitor treatment. Performed at [facility], [place] [address] [place], [place] [address]     Labs: CBC: Recent Labs  Lab 03/23/22 [phone] 03/24/22 0343 03/25/22 0341 03/29/22 0139  WBC 12.0* 13.5* 10.3 10.8*  NEUTROABS 10.2* 10.1* 7.1  --   HGB 13.7 11.4* 11.3* 10.6*  HCT 39.8 32.9* 33.3* 32.1*  MCV 89.2 89.2 91.2 92.8  PLT 170 163 189 536   Basic Metabolic Panel: Recent Labs  Lab 03/23/22 0338 03/24/22 0343 03/25/22 0341 03/29/22 0139  NA 131* 131* 132* 136  K 4.6 4.2 3.9 3.6  CL 99 95* 100 103  CO2 '22 24 27 26  '$ GLUCOSE 139* 127* 114* 155*  BUN '12 17 20 15  '$ CREATININE 0.81 0.81 0.84 0.79  CALCIUM 8.4* 8.3* 8.0* 8.0*   Liver Function Tests: No results for input(s): "AST", "ALT", "ALKPHOS", "BILITOT", "PROT", "ALBUMIN" in the last 168 hours. CBG: No results for input(s): "GLUCAP" in the last 168 hours.  Discharge time spent: greater than 30 minutes.  Signed: Estill Cotta, MD [facility] 03/29/2022

## 2022-03-29 NOTE — TOC Transition Note (Signed)
Transition of Care [facility]) - CM/SW Discharge Note   Patient Details  Name: Alex [name] MRN: [id number] Date of Birth: 31-Dec-1950  Transition of Care [facility]) CM/SW Contact:  Milinda Antis, [place] Phone Number: 03/29/2022, 11:50 AM   Clinical Narrative:    Patient will DC to:  [facility] Anticipated DC date: 03/29/2022 Family notified: Yes Transport by: Corey Harold   Per MD patient ready for DC to SNF. RN to call report prior to discharge [phone] room 115. RN, patient, patient's family, and facility notified of DC. Discharge Summary and FL2 sent to facility. DC packet on chart. Ambulance transport will be requested for patient.   CSW will sign off for now as social work intervention is no longer needed. Please consult [place] again if new needs arise.     Final next level of care: Skilled Nursing Facility Barriers to Discharge: Barriers Resolved   Patient Goals and CMS Choice Patient states their goals for this hospitalization and ongoing recovery are:: "get back to work"   Choice offered to / list presented to : Patient (pt wants to discuss with daughter)  Discharge Placement              Patient chooses bed at:  [facility]) Patient to be transferred to facility by: [place] Name of family member notified: Warnell, Ras[place] (Daughter)   [phone] Patient and family notified of of transfer: 03/29/22  Discharge Plan and Services In-house Referral: Clinical Social Work   Post Acute Care Choice: [place]                               Social Determinants of Health (SDOH) Interventions     Readmission Risk Interventions     No data to display

## 2022-03-29 NOTE — TOC Progression Note (Signed)
Transition of Care [facility]) - Initial/Assessment Note    Patient Details  Name: Alex [name] MRN: [id number] Date of Birth: 1951-04-19  Transition of Care [facility]) CM/SW Contact:    Milinda Antis, LCSWA Phone Number: 03/29/2022, 10:40 AM  Clinical Narrative:                 CSW contacted Altha Harm at [facility] to inquire about the status of insurance authorization.  There was no answer. CSW left a VM requesting a returned call.   TOC will continue to follow.   Expected Discharge Plan: Skilled Nursing Facility Barriers to Discharge: Continued Medical Work up, SNF Pending bed offer   Patient Goals and CMS Choice Patient states their goals for this hospitalization and ongoing recovery are:: "get back to work"   Choice offered to / list presented to : Patient (pt wants to discuss with daughter)  Expected Discharge Plan and Services Expected Discharge Plan: [place] In-house Referral: Clinical Social Work   Post Acute Care Choice: [place] Living arrangements for the past 2 months: [place] Expected Discharge Date: 03/29/22                                    Prior Living Arrangements/Services Living arrangements for the past 2 months: Single Family Home Lives with:: Self Patient language and need for interpreter reviewed:: Yes        Need for Family Participation in Patient Care: Yes (Comment) Care giver support system in place?: Yes (comment) Current home services: Other (comment) (none) Criminal Activity/Legal Involvement Pertinent to Current Situation/Hospitalization: No - Comment as needed  Activities of Daily Living Home Assistive Devices/Equipment: Eyeglasses, Hearing aid, Walker (specify type) ADL Screening (condition at time of admission) Patient's cognitive ability adequate to safely complete daily activities?: Yes Is the patient deaf or have difficulty hearing?: No Does the patient have difficulty seeing, even  when wearing glasses/contacts?: No Does the patient have difficulty concentrating, remembering, or making decisions?: No Patient able to express need for assistance with ADLs?: Yes Does the patient have difficulty dressing or bathing?: No Independently performs ADLs?: Yes (appropriate for developmental age) Does the patient have difficulty walking or climbing stairs?: Yes Weakness of Legs: Both Weakness of Arms/Hands: None  Permission Sought/Granted Permission sought to share information with : Family Supports Permission granted to share information with : Yes, Verbal Permission Granted  Share Information with NAME: daughter Rojelio Brenner  Permission granted to share info w AGENCY: SNF        Emotional Assessment Appearance:: Appears stated age Attitude/Demeanor/Rapport: Engaged Affect (typically observed): Appropriate, Pleasant Orientation: : Oriented to Self, Oriented to Place, Oriented to  Time, Oriented to Situation Alcohol / Substance Use: Not Applicable Psych Involvement: No (comment)  Admission diagnosis:  Closed left hip fracture ([place]) [S72.002A] Closed left hip fracture, initial encounter ([place]) [S72.002A] Patient Active Problem List   Diagnosis Date Noted   Acute urinary retention 03/22/2022   S/P total left hip arthroplasty 03/22/2022   Closed left hip fracture ([place]) 03/21/2022   Hyperlipidemia 03/21/2022   HNP (herniated nucleus pulposus), cervical 11/28/2017   Personal history of DVT (deep vein thrombosis) 05/14/2017   Essential hypertension 05/14/2017   Cellulitis of left leg 05/14/2017   PCP:  Velna Hatchet, MD Pharmacy:   [place], [place] - [place] [phone] [address] Phone: [phone] Fax: [phone]     Social Determinants of Health (SDOH) Interventions    Readmission Risk Interventions     No data to display

## 2022-03-29 NOTE — Plan of Care (Signed)

## 2022-03-29 NOTE — Progress Notes (Signed)
Report called to [facility]. All questions answered. [facility] transport scheduled for 2pm.

## 2022-03-29 NOTE — Progress Notes (Signed)
Physical Therapy Treatment Patient Details Name: Alex [name] MRN: [id number] DOB: 1951/05/07 Today's Date: 03/29/2022   History of Present Illness 71 y/o male admitted post accidental fall at restaurant on 03/21/22 and found to have hip fracture; s/p L THA for surgical fixation, WBAT. PMH: DVT (deep venous thrombosis) ([place]), Headache, Hypercholesterolemia, and Hypertension, ACDF 2019    PT Comments    Pt agreeable to session. Pt instructed in and performed gait training, therex, and therapeutic activities. Pt able to increase gait distance to 100 feet with RW. Pt with excellent progress towards established goals.   Recommendations for follow up therapy are one component of a multi-disciplinary discharge planning process, led by the attending physician.  Recommendations may be updated based on patient status, additional functional criteria and insurance authorization.  Follow Up Recommendations  Skilled nursing-short term rehab (<3 hours/day) Can patient physically be transported by private vehicle: No   Assistance Recommended at Discharge Frequent or constant Supervision/Assistance  Patient can return home with the following Two people to help with walking and/or transfers;Two people to help with bathing/dressing/bathroom   Equipment Recommendations  BSC/3in1    Recommendations for Other Services       Precautions / Restrictions Precautions Precautions: Back;Fall Precaution Comments: Recent back injury, and follows back precautions for comfort; Still, with L hip pain, log rolling is quite difficult Required Braces or Orthoses: Spinal Brace Spinal Brace: Lumbar corset;Applied in sitting position Restrictions Weight Bearing Restrictions: Yes LLE Weight Bearing: Weight bearing as tolerated     Mobility  Bed Mobility Overal bed mobility: Needs Assistance Bed Mobility: Supine to Sit, Sit to Supine     Supine to sit: Min assist Sit to supine: Mod assist   General bed mobility  comments: Pt has preference to get in and out of bed on R side to decrease L hip pain. Supine > long sitting > EOB performed. Assistance required for LE management    Transfers Overall transfer level: Needs assistance Equipment used: Rolling walker (2 wheels) Transfers: Sit to/from Stand Sit to Stand: Min assist, Min guard           General transfer comment: Pt performed 3 sit <> stands during this encounter (one from bed, one from chair, and one from [facility] over toilet). Pt required cues for hand placements and safety awareness. CGA utilized with exception of min assist once due to slight instability with sit to stand transition. Pt also performed side steps at EOB prior to gait.    Ambulation/Gait Ambulation/Gait assistance: Min guard Gait Distance (Feet): 100 Feet Assistive device: Rolling walker (2 wheels) (chair follow) Gait Pattern/deviations: Step-to pattern, Decreased stance time - left, Decreased weight shift to left, Decreased step length - left, Decreased step length - right, Antalgic, Trunk flexed, Step-through pattern Gait velocity: decreased Gait velocity interpretation: <1.31 ft/sec, indicative of household ambulator   General Gait Details: Pt able to progress from step to pattern to step through pattern although decreased step length present bilaterally. Pt required cues to improve his COG position inside RW. No LOB occurred.   Stairs             Wheelchair Mobility    Modified Rankin (Stroke Patients Only)       Balance Overall balance assessment: Needs assistance Sitting-balance support: Bilateral upper extremity supported, Feet supported Sitting balance-Leahy Scale: Fair     Standing balance support: Bilateral upper extremity supported, Reliant on assistive device for balance Standing balance-Leahy Scale: Poor  Cognition Arousal/Alertness: Awake/alert Behavior During Therapy: WFL for tasks  assessed/performed, Anxious Overall Cognitive Status: No family/caregiver present to determine baseline cognitive functioning Area of Impairment: Attention, Memory, Following commands, Problem solving                   Current Attention Level: Sustained Memory: Decreased short-term memory Following Commands: Follows one step commands with increased time     Problem Solving: Difficulty sequencing, Requires verbal cues, Requires tactile cues          Exercises General Exercises - Lower Extremity Quad Sets: Both, 10 reps, Supine Gluteal Sets: Both, 10 reps, Supine Straight Leg Raises: Other (comment) (pt unable to perform single rep on L LE)    General Comments General comments (skin integrity, edema, etc.): 97% SpO2 and 94 bpm HR      Pertinent Vitals/Pain Pain Assessment Pain Assessment: 0-10 Pain Score: 3  Pain Location: L hip Pain Descriptors / Indicators: Aching, Grimacing, Guarding Pain Intervention(s): Limited activity within patient's tolerance, Monitored during session    Home Living                          Prior Function            PT Goals (current goals can now be found in the care plan section) Acute Rehab PT Goals Patient Stated Goal: Did not state PT Goal Formulation: With patient Time For Goal Achievement: 04/06/22 Potential to Achieve Goals: Good Progress towards PT goals: Progressing toward goals    Frequency    Min 3X/week      PT Plan Current plan remains appropriate    Co-evaluation              AM-PAC PT "6 Clicks" Mobility   Outcome Measure  Help needed turning from your back to your side while in a flat bed without using bedrails?: A Little Help needed moving from lying on your back to sitting on the side of a flat bed without using bedrails?: A Little Help needed moving to and from a bed to a chair (including a wheelchair)?: A Lot Help needed standing up from a chair using your arms (e.g., wheelchair or  bedside chair)?: A Little Help needed to walk in hospital room?: A Little Help needed climbing 3-5 steps with a railing? : A Lot 6 Click Score: 16    End of Session Equipment Utilized During Treatment: Gait belt;Back brace Activity Tolerance: Patient tolerated treatment well Patient left: in chair;with call bell/phone within reach Nurse Communication: Mobility status PT Visit Diagnosis: Unsteadiness on feet (R26.81);Other abnormalities of gait and mobility (R26.89);History of falling (Z91.81);Muscle weakness (generalized) (M62.81);Pain Pain - Right/Left: Left Pain - part of body: Hip     Time: 5093-2671 PT Time Calculation (min) (ACUTE ONLY): 28 min  Charges:  $Gait Training: 8-22 mins $Therapeutic Activity: 8-22 mins                     Donna Bernard, PT    [facility] 03/29/2022, 1:53 PM

## 2022-04-05 ENCOUNTER — Other Ambulatory Visit: Payer: BC Managed Care – PPO

## 2022-04-08 DIAGNOSIS — I82409 Acute embolism and thrombosis of unspecified deep veins of unspecified lower extremity: Secondary | ICD-10-CM | POA: Diagnosis not present

## 2022-04-08 DIAGNOSIS — S72002A Fracture of unspecified part of neck of left femur, initial encounter for closed fracture: Secondary | ICD-10-CM | POA: Diagnosis not present

## 2022-04-08 DIAGNOSIS — M25512 Pain in left shoulder: Secondary | ICD-10-CM | POA: Diagnosis not present

## 2022-04-08 DIAGNOSIS — R5381 Other malaise: Secondary | ICD-10-CM | POA: Diagnosis not present

## 2022-04-09 DIAGNOSIS — I10 Essential (primary) hypertension: Secondary | ICD-10-CM | POA: Diagnosis not present

## 2022-04-09 DIAGNOSIS — I82409 Acute embolism and thrombosis of unspecified deep veins of unspecified lower extremity: Secondary | ICD-10-CM | POA: Diagnosis not present

## 2022-04-09 DIAGNOSIS — M19012 Primary osteoarthritis, left shoulder: Secondary | ICD-10-CM | POA: Diagnosis not present

## 2022-04-09 DIAGNOSIS — S72002A Fracture of unspecified part of neck of left femur, initial encounter for closed fracture: Secondary | ICD-10-CM | POA: Diagnosis not present

## 2022-04-16 DIAGNOSIS — M25512 Pain in left shoulder: Secondary | ICD-10-CM | POA: Diagnosis not present

## 2022-04-16 DIAGNOSIS — R5381 Other malaise: Secondary | ICD-10-CM | POA: Diagnosis not present

## 2022-04-16 DIAGNOSIS — I82409 Acute embolism and thrombosis of unspecified deep veins of unspecified lower extremity: Secondary | ICD-10-CM | POA: Diagnosis not present

## 2022-04-16 DIAGNOSIS — S72002A Fracture of unspecified part of neck of left femur, initial encounter for closed fracture: Secondary | ICD-10-CM | POA: Diagnosis not present

## 2022-04-22 DIAGNOSIS — M25512 Pain in left shoulder: Secondary | ICD-10-CM | POA: Diagnosis not present

## 2022-04-22 DIAGNOSIS — M7502 Adhesive capsulitis of left shoulder: Secondary | ICD-10-CM | POA: Diagnosis not present

## 2022-04-23 DIAGNOSIS — I10 Essential (primary) hypertension: Secondary | ICD-10-CM | POA: Diagnosis not present

## 2022-04-23 DIAGNOSIS — R5381 Other malaise: Secondary | ICD-10-CM | POA: Diagnosis not present

## 2022-04-23 DIAGNOSIS — I82409 Acute embolism and thrombosis of unspecified deep veins of unspecified lower extremity: Secondary | ICD-10-CM | POA: Diagnosis not present

## 2022-04-23 DIAGNOSIS — M25512 Pain in left shoulder: Secondary | ICD-10-CM | POA: Diagnosis not present

## 2022-04-30 DIAGNOSIS — E7800 Pure hypercholesterolemia, unspecified: Secondary | ICD-10-CM | POA: Diagnosis not present

## 2022-04-30 DIAGNOSIS — M19012 Primary osteoarthritis, left shoulder: Secondary | ICD-10-CM | POA: Diagnosis not present

## 2022-04-30 DIAGNOSIS — I10 Essential (primary) hypertension: Secondary | ICD-10-CM | POA: Diagnosis not present

## 2022-04-30 DIAGNOSIS — N400 Enlarged prostate without lower urinary tract symptoms: Secondary | ICD-10-CM | POA: Diagnosis not present

## 2022-05-01 DIAGNOSIS — S72002A Fracture of unspecified part of neck of left femur, initial encounter for closed fracture: Secondary | ICD-10-CM | POA: Diagnosis not present

## 2022-05-01 DIAGNOSIS — I10 Essential (primary) hypertension: Secondary | ICD-10-CM | POA: Diagnosis not present

## 2022-05-01 DIAGNOSIS — M5020 Other cervical disc displacement, unspecified cervical region: Secondary | ICD-10-CM | POA: Diagnosis not present

## 2022-05-01 DIAGNOSIS — M6281 Muscle weakness (generalized): Secondary | ICD-10-CM | POA: Diagnosis not present

## 2022-05-01 DIAGNOSIS — R2681 Unsteadiness on feet: Secondary | ICD-10-CM | POA: Diagnosis not present

## 2022-05-03 DIAGNOSIS — I10 Essential (primary) hypertension: Secondary | ICD-10-CM | POA: Diagnosis not present

## 2022-05-03 DIAGNOSIS — S72002A Fracture of unspecified part of neck of left femur, initial encounter for closed fracture: Secondary | ICD-10-CM | POA: Diagnosis not present

## 2022-05-03 DIAGNOSIS — M5020 Other cervical disc displacement, unspecified cervical region: Secondary | ICD-10-CM | POA: Diagnosis not present

## 2022-05-03 DIAGNOSIS — M6281 Muscle weakness (generalized): Secondary | ICD-10-CM | POA: Diagnosis not present

## 2022-05-03 DIAGNOSIS — R2681 Unsteadiness on feet: Secondary | ICD-10-CM | POA: Diagnosis not present

## 2022-05-04 DIAGNOSIS — S72002A Fracture of unspecified part of neck of left femur, initial encounter for closed fracture: Secondary | ICD-10-CM | POA: Diagnosis not present

## 2022-05-04 DIAGNOSIS — M5020 Other cervical disc displacement, unspecified cervical region: Secondary | ICD-10-CM | POA: Diagnosis not present

## 2022-05-04 DIAGNOSIS — R2681 Unsteadiness on feet: Secondary | ICD-10-CM | POA: Diagnosis not present

## 2022-05-04 DIAGNOSIS — I10 Essential (primary) hypertension: Secondary | ICD-10-CM | POA: Diagnosis not present

## 2022-05-04 DIAGNOSIS — M6281 Muscle weakness (generalized): Secondary | ICD-10-CM | POA: Diagnosis not present

## 2022-05-05 DIAGNOSIS — M75122 Complete rotator cuff tear or rupture of left shoulder, not specified as traumatic: Secondary | ICD-10-CM | POA: Diagnosis not present

## 2022-05-05 DIAGNOSIS — M5020 Other cervical disc displacement, unspecified cervical region: Secondary | ICD-10-CM | POA: Diagnosis not present

## 2022-05-05 DIAGNOSIS — R2681 Unsteadiness on feet: Secondary | ICD-10-CM | POA: Diagnosis not present

## 2022-05-05 DIAGNOSIS — S72002A Fracture of unspecified part of neck of left femur, initial encounter for closed fracture: Secondary | ICD-10-CM | POA: Diagnosis not present

## 2022-05-05 DIAGNOSIS — I10 Essential (primary) hypertension: Secondary | ICD-10-CM | POA: Diagnosis not present

## 2022-05-05 DIAGNOSIS — M6281 Muscle weakness (generalized): Secondary | ICD-10-CM | POA: Diagnosis not present

## 2022-05-06 DIAGNOSIS — I10 Essential (primary) hypertension: Secondary | ICD-10-CM | POA: Diagnosis not present

## 2022-05-06 DIAGNOSIS — M19012 Primary osteoarthritis, left shoulder: Secondary | ICD-10-CM | POA: Diagnosis not present

## 2022-05-06 DIAGNOSIS — E7800 Pure hypercholesterolemia, unspecified: Secondary | ICD-10-CM | POA: Diagnosis not present

## 2022-05-06 DIAGNOSIS — M6281 Muscle weakness (generalized): Secondary | ICD-10-CM | POA: Diagnosis not present

## 2022-05-06 DIAGNOSIS — M5020 Other cervical disc displacement, unspecified cervical region: Secondary | ICD-10-CM | POA: Diagnosis not present

## 2022-05-06 DIAGNOSIS — N400 Enlarged prostate without lower urinary tract symptoms: Secondary | ICD-10-CM | POA: Diagnosis not present

## 2022-05-06 DIAGNOSIS — R2681 Unsteadiness on feet: Secondary | ICD-10-CM | POA: Diagnosis not present

## 2022-05-06 DIAGNOSIS — S72002A Fracture of unspecified part of neck of left femur, initial encounter for closed fracture: Secondary | ICD-10-CM | POA: Diagnosis not present

## 2022-05-07 DIAGNOSIS — S72002A Fracture of unspecified part of neck of left femur, initial encounter for closed fracture: Secondary | ICD-10-CM | POA: Diagnosis not present

## 2022-05-07 DIAGNOSIS — R2681 Unsteadiness on feet: Secondary | ICD-10-CM | POA: Diagnosis not present

## 2022-05-07 DIAGNOSIS — M5020 Other cervical disc displacement, unspecified cervical region: Secondary | ICD-10-CM | POA: Diagnosis not present

## 2022-05-07 DIAGNOSIS — M6281 Muscle weakness (generalized): Secondary | ICD-10-CM | POA: Diagnosis not present

## 2022-05-07 DIAGNOSIS — I10 Essential (primary) hypertension: Secondary | ICD-10-CM | POA: Diagnosis not present

## 2022-05-10 DIAGNOSIS — M6281 Muscle weakness (generalized): Secondary | ICD-10-CM | POA: Diagnosis not present

## 2022-05-10 DIAGNOSIS — I10 Essential (primary) hypertension: Secondary | ICD-10-CM | POA: Diagnosis not present

## 2022-05-10 DIAGNOSIS — M5020 Other cervical disc displacement, unspecified cervical region: Secondary | ICD-10-CM | POA: Diagnosis not present

## 2022-05-10 DIAGNOSIS — R2681 Unsteadiness on feet: Secondary | ICD-10-CM | POA: Diagnosis not present

## 2022-05-10 DIAGNOSIS — S72002A Fracture of unspecified part of neck of left femur, initial encounter for closed fracture: Secondary | ICD-10-CM | POA: Diagnosis not present

## 2022-05-11 DIAGNOSIS — R2681 Unsteadiness on feet: Secondary | ICD-10-CM | POA: Diagnosis not present

## 2022-05-11 DIAGNOSIS — I10 Essential (primary) hypertension: Secondary | ICD-10-CM | POA: Diagnosis not present

## 2022-05-11 DIAGNOSIS — M6281 Muscle weakness (generalized): Secondary | ICD-10-CM | POA: Diagnosis not present

## 2022-05-11 DIAGNOSIS — M5020 Other cervical disc displacement, unspecified cervical region: Secondary | ICD-10-CM | POA: Diagnosis not present

## 2022-05-11 DIAGNOSIS — S72002A Fracture of unspecified part of neck of left femur, initial encounter for closed fracture: Secondary | ICD-10-CM | POA: Diagnosis not present

## 2022-05-12 DIAGNOSIS — R2681 Unsteadiness on feet: Secondary | ICD-10-CM | POA: Diagnosis not present

## 2022-05-12 DIAGNOSIS — M5020 Other cervical disc displacement, unspecified cervical region: Secondary | ICD-10-CM | POA: Diagnosis not present

## 2022-05-12 DIAGNOSIS — M6281 Muscle weakness (generalized): Secondary | ICD-10-CM | POA: Diagnosis not present

## 2022-05-12 DIAGNOSIS — I10 Essential (primary) hypertension: Secondary | ICD-10-CM | POA: Diagnosis not present

## 2022-05-12 DIAGNOSIS — S72002A Fracture of unspecified part of neck of left femur, initial encounter for closed fracture: Secondary | ICD-10-CM | POA: Diagnosis not present

## 2022-05-13 DIAGNOSIS — M6281 Muscle weakness (generalized): Secondary | ICD-10-CM | POA: Diagnosis not present

## 2022-05-13 DIAGNOSIS — M5020 Other cervical disc displacement, unspecified cervical region: Secondary | ICD-10-CM | POA: Diagnosis not present

## 2022-05-13 DIAGNOSIS — S72002A Fracture of unspecified part of neck of left femur, initial encounter for closed fracture: Secondary | ICD-10-CM | POA: Diagnosis not present

## 2022-05-13 DIAGNOSIS — R2681 Unsteadiness on feet: Secondary | ICD-10-CM | POA: Diagnosis not present

## 2022-05-13 DIAGNOSIS — I10 Essential (primary) hypertension: Secondary | ICD-10-CM | POA: Diagnosis not present

## 2022-05-14 DIAGNOSIS — M6281 Muscle weakness (generalized): Secondary | ICD-10-CM | POA: Diagnosis not present

## 2022-05-14 DIAGNOSIS — S72002A Fracture of unspecified part of neck of left femur, initial encounter for closed fracture: Secondary | ICD-10-CM | POA: Diagnosis not present

## 2022-05-14 DIAGNOSIS — M5020 Other cervical disc displacement, unspecified cervical region: Secondary | ICD-10-CM | POA: Diagnosis not present

## 2022-05-14 DIAGNOSIS — I10 Essential (primary) hypertension: Secondary | ICD-10-CM | POA: Diagnosis not present

## 2022-05-14 DIAGNOSIS — R2681 Unsteadiness on feet: Secondary | ICD-10-CM | POA: Diagnosis not present

## 2022-05-16 DIAGNOSIS — I10 Essential (primary) hypertension: Secondary | ICD-10-CM | POA: Diagnosis not present

## 2022-05-16 DIAGNOSIS — M6281 Muscle weakness (generalized): Secondary | ICD-10-CM | POA: Diagnosis not present

## 2022-05-16 DIAGNOSIS — M5020 Other cervical disc displacement, unspecified cervical region: Secondary | ICD-10-CM | POA: Diagnosis not present

## 2022-05-16 DIAGNOSIS — R2681 Unsteadiness on feet: Secondary | ICD-10-CM | POA: Diagnosis not present

## 2022-05-16 DIAGNOSIS — S72002A Fracture of unspecified part of neck of left femur, initial encounter for closed fracture: Secondary | ICD-10-CM | POA: Diagnosis not present

## 2022-05-17 DIAGNOSIS — R2681 Unsteadiness on feet: Secondary | ICD-10-CM | POA: Diagnosis not present

## 2022-05-17 DIAGNOSIS — S72002A Fracture of unspecified part of neck of left femur, initial encounter for closed fracture: Secondary | ICD-10-CM | POA: Diagnosis not present

## 2022-05-17 DIAGNOSIS — M6281 Muscle weakness (generalized): Secondary | ICD-10-CM | POA: Diagnosis not present

## 2022-05-17 DIAGNOSIS — M5020 Other cervical disc displacement, unspecified cervical region: Secondary | ICD-10-CM | POA: Diagnosis not present

## 2022-05-17 DIAGNOSIS — I10 Essential (primary) hypertension: Secondary | ICD-10-CM | POA: Diagnosis not present

## 2022-05-18 DIAGNOSIS — S72002A Fracture of unspecified part of neck of left femur, initial encounter for closed fracture: Secondary | ICD-10-CM | POA: Diagnosis not present

## 2022-05-18 DIAGNOSIS — M6281 Muscle weakness (generalized): Secondary | ICD-10-CM | POA: Diagnosis not present

## 2022-05-18 DIAGNOSIS — I10 Essential (primary) hypertension: Secondary | ICD-10-CM | POA: Diagnosis not present

## 2022-05-18 DIAGNOSIS — M5020 Other cervical disc displacement, unspecified cervical region: Secondary | ICD-10-CM | POA: Diagnosis not present

## 2022-05-18 DIAGNOSIS — R2681 Unsteadiness on feet: Secondary | ICD-10-CM | POA: Diagnosis not present

## 2022-05-19 DIAGNOSIS — M6281 Muscle weakness (generalized): Secondary | ICD-10-CM | POA: Diagnosis not present

## 2022-05-19 DIAGNOSIS — S72002A Fracture of unspecified part of neck of left femur, initial encounter for closed fracture: Secondary | ICD-10-CM | POA: Diagnosis not present

## 2022-05-19 DIAGNOSIS — M5020 Other cervical disc displacement, unspecified cervical region: Secondary | ICD-10-CM | POA: Diagnosis not present

## 2022-05-19 DIAGNOSIS — I10 Essential (primary) hypertension: Secondary | ICD-10-CM | POA: Diagnosis not present

## 2022-05-19 DIAGNOSIS — R2681 Unsteadiness on feet: Secondary | ICD-10-CM | POA: Diagnosis not present

## 2022-05-20 DIAGNOSIS — M6281 Muscle weakness (generalized): Secondary | ICD-10-CM | POA: Diagnosis not present

## 2022-05-20 DIAGNOSIS — S72002A Fracture of unspecified part of neck of left femur, initial encounter for closed fracture: Secondary | ICD-10-CM | POA: Diagnosis not present

## 2022-05-20 DIAGNOSIS — M25512 Pain in left shoulder: Secondary | ICD-10-CM | POA: Diagnosis not present

## 2022-05-20 DIAGNOSIS — R2681 Unsteadiness on feet: Secondary | ICD-10-CM | POA: Diagnosis not present

## 2022-05-20 DIAGNOSIS — I10 Essential (primary) hypertension: Secondary | ICD-10-CM | POA: Diagnosis not present

## 2022-05-20 DIAGNOSIS — E7800 Pure hypercholesterolemia, unspecified: Secondary | ICD-10-CM | POA: Diagnosis not present

## 2022-05-20 DIAGNOSIS — N400 Enlarged prostate without lower urinary tract symptoms: Secondary | ICD-10-CM | POA: Diagnosis not present

## 2022-05-20 DIAGNOSIS — M19012 Primary osteoarthritis, left shoulder: Secondary | ICD-10-CM | POA: Diagnosis not present

## 2022-05-20 DIAGNOSIS — M5020 Other cervical disc displacement, unspecified cervical region: Secondary | ICD-10-CM | POA: Diagnosis not present

## 2022-05-22 DIAGNOSIS — M6281 Muscle weakness (generalized): Secondary | ICD-10-CM | POA: Diagnosis not present

## 2022-05-22 DIAGNOSIS — S72002A Fracture of unspecified part of neck of left femur, initial encounter for closed fracture: Secondary | ICD-10-CM | POA: Diagnosis not present

## 2022-05-22 DIAGNOSIS — I10 Essential (primary) hypertension: Secondary | ICD-10-CM | POA: Diagnosis not present

## 2022-05-22 DIAGNOSIS — M5020 Other cervical disc displacement, unspecified cervical region: Secondary | ICD-10-CM | POA: Diagnosis not present

## 2022-05-22 DIAGNOSIS — R2681 Unsteadiness on feet: Secondary | ICD-10-CM | POA: Diagnosis not present

## 2022-05-23 DIAGNOSIS — I10 Essential (primary) hypertension: Secondary | ICD-10-CM | POA: Diagnosis not present

## 2022-05-23 DIAGNOSIS — R2681 Unsteadiness on feet: Secondary | ICD-10-CM | POA: Diagnosis not present

## 2022-05-23 DIAGNOSIS — M6281 Muscle weakness (generalized): Secondary | ICD-10-CM | POA: Diagnosis not present

## 2022-05-23 DIAGNOSIS — S72002A Fracture of unspecified part of neck of left femur, initial encounter for closed fracture: Secondary | ICD-10-CM | POA: Diagnosis not present

## 2022-05-23 DIAGNOSIS — M5020 Other cervical disc displacement, unspecified cervical region: Secondary | ICD-10-CM | POA: Diagnosis not present

## 2022-05-24 DIAGNOSIS — M5020 Other cervical disc displacement, unspecified cervical region: Secondary | ICD-10-CM | POA: Diagnosis not present

## 2022-05-24 DIAGNOSIS — S72002A Fracture of unspecified part of neck of left femur, initial encounter for closed fracture: Secondary | ICD-10-CM | POA: Diagnosis not present

## 2022-05-24 DIAGNOSIS — R2681 Unsteadiness on feet: Secondary | ICD-10-CM | POA: Diagnosis not present

## 2022-05-24 DIAGNOSIS — I10 Essential (primary) hypertension: Secondary | ICD-10-CM | POA: Diagnosis not present

## 2022-05-24 DIAGNOSIS — M6281 Muscle weakness (generalized): Secondary | ICD-10-CM | POA: Diagnosis not present

## 2022-05-25 DIAGNOSIS — M5020 Other cervical disc displacement, unspecified cervical region: Secondary | ICD-10-CM | POA: Diagnosis not present

## 2022-05-25 DIAGNOSIS — I10 Essential (primary) hypertension: Secondary | ICD-10-CM | POA: Diagnosis not present

## 2022-05-25 DIAGNOSIS — R2681 Unsteadiness on feet: Secondary | ICD-10-CM | POA: Diagnosis not present

## 2022-05-25 DIAGNOSIS — M6281 Muscle weakness (generalized): Secondary | ICD-10-CM | POA: Diagnosis not present

## 2022-05-25 DIAGNOSIS — S72002A Fracture of unspecified part of neck of left femur, initial encounter for closed fracture: Secondary | ICD-10-CM | POA: Diagnosis not present

## 2022-06-14 DIAGNOSIS — I10 Essential (primary) hypertension: Secondary | ICD-10-CM | POA: Diagnosis not present

## 2022-06-14 DIAGNOSIS — N400 Enlarged prostate without lower urinary tract symptoms: Secondary | ICD-10-CM | POA: Diagnosis not present

## 2022-06-14 DIAGNOSIS — M19012 Primary osteoarthritis, left shoulder: Secondary | ICD-10-CM | POA: Diagnosis not present

## 2022-06-14 DIAGNOSIS — E7800 Pure hypercholesterolemia, unspecified: Secondary | ICD-10-CM | POA: Diagnosis not present

## 2022-06-24 DIAGNOSIS — N400 Enlarged prostate without lower urinary tract symptoms: Secondary | ICD-10-CM | POA: Diagnosis not present

## 2022-06-24 DIAGNOSIS — M79641 Pain in right hand: Secondary | ICD-10-CM | POA: Diagnosis not present

## 2022-06-24 DIAGNOSIS — I10 Essential (primary) hypertension: Secondary | ICD-10-CM | POA: Diagnosis not present

## 2022-06-24 DIAGNOSIS — E7800 Pure hypercholesterolemia, unspecified: Secondary | ICD-10-CM | POA: Diagnosis not present

## 2022-06-29 NOTE — H&P (Signed)
Patient's anticipated LOS is less than 2 midnights, meeting these requirements: - Younger than 24 - Lives within 1 hour of care - Has a competent adult at home to recover with post-op recover - NO history of  - Chronic pain requiring opiods  - Diabetes  - Coronary Artery Disease  - Heart failure  - Heart attack  - Stroke  - DVT/VTE  - Cardiac arrhythmia  - Respiratory Failure/COPD  - Renal failure  - Anemia  - Advanced Liver disease     Alex [name] is an 71 y.o. male.    Chief Complaint: left shoulder pain  HPI: Pt is a 71 y.o. male complaining of left shoulder pain for multiple years. Pain had continually increased since the beginning. X-rays in the clinic show end-stage arthritic changes of the left shoulder. Pt has tried various conservative treatments which have failed to alleviate their symptoms, including injections and therapy. Various options are discussed with the patient. Risks, benefits and expectations were discussed with the patient. Patient understand the risks, benefits and expectations and wishes to proceed with surgery.   PCP:  Velna Hatchet, MD  D/C Plans: Home  PMH: Past Medical History:  Diagnosis Date   DVT (deep venous thrombosis) ([place])    Headache    Hypercholesterolemia    Hypertension     PSH: Past Surgical History:  Procedure Laterality Date   ANTERIOR CERVICAL DECOMP/DISCECTOMY FUSION N/A 11/28/2017   Procedure: ANTERIOR CERVICAL DECOMPRESSION/DISCECTOMY FUSION CERVICAL THREE- CERVICAL FOUR;  Surgeon: Jovita Gamma, MD;  Location: [place];  Service: Neurosurgery;  Laterality: N/A;   ANTERIOR FUSION CLIVUS-C2 EXTRAORAL W/ ODONTOID EXCISION     SHOULDER ARTHROSCOPY     TOTAL HIP ARTHROPLASTY Left 03/22/2022   Procedure: ANTERIOR TOTAL HIP ARTHROPLASTY;  Surgeon: Paralee Cancel, MD;  Location: [place];  Service: Orthopedics;  Laterality: Left;   URINARY SURGERY     YRS AGO    Social History:  reports that he has quit smoking. His smokeless  tobacco use includes snuff. He reports current alcohol use. He reports that he does not use drugs. BMI: Estimated body mass index is 29.27 kg/m as calculated from the following:   Height as of 03/22/22: '6\' 2"'$  (1.88 m).   Weight as of 03/22/22: 103.4 kg.  Lab Results  Component Value Date   ALBUMIN 3.1 (L) 03/21/2022   Diabetes: Patient does not have a diagnosis of diabetes. Lab Results  Component Value Date   HGBA1C 5.4 03/21/2022     Smoking Status:      Allergies:  Allergies  Allergen Reactions   Vancomycin Hives    Medications: No current facility-administered medications for this encounter.   Current Outpatient Medications  Medication Sig Dispense Refill   acetaminophen (TYLENOL) 325 MG tablet Take 1-2 tablets (325-650 mg total) by mouth every 6 (six) hours as needed for mild pain (pain score 1-3 or temp > 100.5).     atorvastatin (LIPITOR) 20 MG tablet Take 20 mg by mouth at bedtime.     cyclobenzaprine (FLEXERIL) 10 MG tablet Take 10 mg by mouth at bedtime.     docusate sodium (COLACE) 100 MG capsule Take 1 capsule (100 mg total) by mouth 2 (two) times daily. 10 capsule 0   ferrous sulfate 325 (65 FE) MG tablet Take 1 tablet (325 mg total) by mouth 3 (three) times daily after meals.  3   finasteride (PROSCAR) 5 MG tablet Take 5 mg by mouth daily.     HYDROcodone-acetaminophen (NORCO/VICODIN) 5-325  MG tablet Take 1-2 tablets by mouth every 6 (six) hours as needed (pain). 25 tablet 0   lisinopril (PRINIVIL,ZESTRIL) 10 MG tablet Take 10 mg by mouth every evening.     methocarbamol (ROBAXIN) 500 MG tablet Take 500-1,500 mg by mouth every 8 (eight) hours as needed for muscle spasms.     metoprolol tartrate (LOPRESSOR) 25 MG tablet Take 0.5 tablets (12.5 mg total) by mouth 2 (two) times daily.     polyethylene glycol (MIRALAX / GLYCOLAX) 17 g packet Take 17 g by mouth daily as needed for mild constipation. 14 each 0   rivaroxaban (XARELTO) 10 MG TABS tablet Take 10 mg by  mouth daily.     silodosin (RAPAFLO) 8 MG CAPS capsule Take 8 mg by mouth daily.      No results found for this or any previous visit (from the past 48 hour(s)). No results found.  ROS: Pain with rom of the left upper extremity  Physical Exam: Alert and oriented 71 y.o. male in no acute distress Cranial nerves 2-12 intact Cervical spine: full rom with no tenderness, nv intact distally Chest: active breath sounds bilaterally, no wheeze rhonchi or rales Heart: regular rate and rhythm, no murmur Abd: non tender non distended with active bowel sounds Hip is stable with rom  Left shoulder painful and weak rom Nv intact distally No rashes or edema distally  Assessment/Plan Assessment: left shoulder cuff arthropathy  Plan:  Patient will undergo a left reverse total shoulder by Dr. Veverly Fells at [place] Risks benefits and expectations were discussed with the patient. Patient understand risks, benefits and expectations and wishes to proceed. Preoperative templating of the joint replacement has been completed, documented, and submitted to the Operating Room personnel in order to optimize intra-operative equipment management.   Merla Riches PA-C, MPAS [facility] is now [employer] [address] [place], [place], [place] [address] Phone: [phone] [email or web address] Facebook  [employer]

## 2022-07-09 ENCOUNTER — Encounter (HOSPITAL_COMMUNITY): Payer: Self-pay | Admitting: Orthopedic Surgery

## 2022-07-09 NOTE — Anesthesia Preprocedure Evaluation (Signed)
Anesthesia Evaluation  Patient identified by MRN, date of birth, ID band Patient awake    Reviewed: Allergy & Precautions, NPO status , Patient's Chart, lab work & pertinent test results  History of Anesthesia Complications Negative for: history of anesthetic complications  Airway Mallampati: II  TM Distance: >3 FB Neck ROM: Full    Dental  (+) Missing,    Pulmonary former smoker   Pulmonary exam normal        Cardiovascular hypertension, Pt. on medications and Pt. on home beta blockers + DVT (2017)  Normal cardiovascular exam     Neuro/Psych    GI/Hepatic   Endo/Other    Renal/GU      Musculoskeletal   Abdominal   Peds  Hematology   Anesthesia Other Findings   Reproductive/Obstetrics                             Anesthesia Physical Anesthesia Plan  ASA: 2  Anesthesia Plan: General   Post-op Pain Management: Regional block* and Tylenol PO (pre-op)*   Induction: Intravenous  PONV Risk Score and Plan: 2 and Treatment may vary due to age or medical condition, Dexamethasone and Ondansetron  Airway Management Planned: Oral ETT  Additional Equipment: None  Intra-op Plan:   Post-operative Plan: Extubation in OR  Informed Consent: I have reviewed the patients History and Physical, chart, labs and discussed the procedure including the risks, benefits and alternatives for the proposed anesthesia with the patient or authorized representative who has indicated his/her understanding and acceptance.     Dental advisory given  Plan Discussed with: CRNA  Anesthesia Plan Comments: (PAT note written 07/09/2022 by Myra Gianotti, PA-C.  )       Anesthesia Quick Evaluation

## 2022-07-09 NOTE — Progress Notes (Signed)
Anesthesia Chart Review: SAME DAY WORK-UP  Case: [id number] Date/Time: 07/12/22 1430   Procedure: Left REVERSE SHOULDER ARTHROPLASTY (Left: Shoulder) - choice and interscalene block   Anesthesia type: Choice   Pre-op diagnosis: left shoulder rotator cuff tear   Location: [facility] OR ROOM 04 / [facility]   Surgeons: Netta Cedars, MD       DISCUSSION: Patient is a 71 year old male scheduled for the above procedure.  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 PCP is Velna Hatchet, MD.There preoperative risk assessment form addressed to "Attending Physician" that is signed on 05/20/22 and indicated he is medically optimized for surgery and classified as "Low Risk."   Of note, prior to his left THR, he was admitted the day before due to a fall with closed left femur neck fracture. His ED EKG on 03/21/22 showed ST at 106, probably LE, borderline LAD, abnormal R wave progression. There was also inferior ST elevation noted, although appears more non-specific. Admitting Hospitalist Phillips Hay, MD also felt "ECG showing non-specific changes". No chest pain or SOB. No known history of CAD, COPD, liver, failure or CKD, but given age and EKG, a preoperative echocardiogram was ordered and showed LVEF 60-65%, no regional wall motion abnormalities, normal right ventricular systolic function is normal, no significant valvular disease. Troponin also negative. Foley catheter did have to be placed for urinary retention. He went on to undergo left THR without known complication. He lives alone and was discharged to SNF level rehab.   Per surgeon's scheduler, patient has been instructed to hold Xarelto after 07/09/22 dose.    Labs from 05/21/22 received from [facility] and include CBC with diff, A1c, CMP, TSH, Vit B12, folate, Lipid panel, Vit D. Results include: TSH 1.10, A1c 5.4%, glucose 85, BUN 15.8, creatinine 0.83, calcium 9.1, sodium 138, potassium 4.6, ALT 15, AST 22, alkaline phosphatase 76, WBC 7.5, hemoglobin 13.9, hematocrit 41.5, platelet count 290.  He is a same day work-up, so labs as indicated on arrival. Anesthesia team to evaluate on the day of surgery.    VS:  BP Readings from Last 3 Encounters:  03/29/22 (!) 146/74  05/29/21 (!) 143/78  11/29/17 127/72   Pulse Readings from Last 3 Encounters:  03/29/22 98  05/29/21 (!) 102  11/29/17 75     PROVIDERS: Velna Hatchet, MD is PCP   LABS: See DISCUSSION.   IMAGES: 1V PCXR 03/21/22: FINDINGS: The heart size and mediastinal contours are within normal limits. There is atherosclerotic calcification of the aorta. No consolidation, effusion, or pneumothorax. Cervical spinal fusion hardware is noted. No acute osseous abnormality. IMPRESSION: No active disease.    EKG:03/21/22: Sinus tachycardia at 106 bpm  Probable left atrial enlargement Borderline left axis deviation Abnormal R-wave progression, early transition ST elevation, consider inferior injury Confirmed by Malvin Johns [phone]) on 03/21/2022 7:30:08 PM   CV: Echo 03/22/22: IMPRESSIONS   1. Limited images patient unable to turn on side.   2. Left ventricular ejection fraction, by estimation, is 60 to 65%. The  left ventricle has normal function. The left ventricle has no regional  wall motion abnormalities. Left ventricular diastolic parameters are  indeterminate.   3. Right ventricular systolic function is normal. The right ventricular  size is normal.   4. The  mitral valve is normal in structure. No evidence of mitral valve  regurgitation. No evidence of mitral stenosis.   5. The aortic valve is normal in structure. Aortic valve regurgitation is  not  visualized. No aortic stenosis is present.   6. The inferior vena cava is normal in size with greater than 50%  respiratory variability, suggesting right atrial pressure of 3 mmHg.    Past Medical History:  Diagnosis Date   DVT (deep venous thrombosis) ([place]) 04/23/2016   LLE   Headache    Hypercholesterolemia    Hypertension     Past Surgical History:  Procedure Laterality Date   ANTERIOR CERVICAL DECOMP/DISCECTOMY FUSION N/A 11/28/2017   Procedure: ANTERIOR CERVICAL DECOMPRESSION/DISCECTOMY FUSION CERVICAL THREE- CERVICAL FOUR;  Surgeon: Jovita Gamma, MD;  Location: [place];  Service: Neurosurgery;  Laterality: N/A;   ANTERIOR FUSION CLIVUS-C2 EXTRAORAL W/ ODONTOID EXCISION     SHOULDER ARTHROSCOPY     TOTAL HIP ARTHROPLASTY Left 03/22/2022   Procedure: ANTERIOR TOTAL HIP ARTHROPLASTY;  Surgeon: Paralee Cancel, MD;  Location: [place];  Service: Orthopedics;  Laterality: Left;   URINARY SURGERY     YRS AGO    MEDICATIONS: No current facility-administered medications for this encounter.    acetaminophen (TYLENOL) 325 MG tablet   ascorbic acid (VITAMIN C) 500 MG tablet   atorvastatin (LIPITOR) 20 MG tablet   celecoxib (CELEBREX) 100 MG capsule   cyclobenzaprine (FLEXERIL) 10 MG tablet   diclofenac Sodium (VOLTAREN) 1 % GEL   docusate sodium (COLACE) 100 MG capsule   ELIQUIS 2.5 MG TABS tablet   ferrous sulfate 325 (65 FE) MG tablet   finasteride (PROSCAR) 5 MG tablet   HYDROcodone-acetaminophen (NORCO/VICODIN) 5-325 MG tablet   lisinopril (PRINIVIL,ZESTRIL) 10 MG tablet   methocarbamol (ROBAXIN) 500 MG tablet   metoprolol tartrate (LOPRESSOR) 25 MG tablet   polyethylene glycol (MIRALAX / GLYCOLAX) 17 g packet   tamsulosin (FLOMAX) 0.4 MG CAPS capsule    Myra Gianotti, PA-C Surgical Short Stay/Anesthesiology [facility] Phone [phone] [facility] Phone [phone] 07/09/2022 11:49 AM

## 2022-07-09 NOTE — Progress Notes (Signed)
SDW CALL Patient is currently at [facility] [phone]) but is getting discharged to home tomorrow.  Left message for nurse at [facility]; waiting a return call.    Patient's daughter was given pre-op instructions over the phone. The opportunity was given for the patient to ask questions. No further questions asked. Patient's daughter verbalized understanding of instructions given.   PCP - Dr. Velna Hatchet Cardiologist - n/a  PPM/ICD - n/a   Chest x-ray - 7/23 EKG - 7/23 Stress Test - denies ECHO - 7/23 Cardiac Cath - denies  Sleep Study - denies   Last dose of GLP1 agonist-  n/a  Blood Thinner Instructions: per Jinny Blossom at Dr. Veverly Fells' office, last dose should be 07/09/2022 Aspirin Instructions: n/a  ERAS Protcol -clears until 1145 PRE-SURGERY Ensure or G2-  SDW and not available to pick up ensure  COVID TEST- n/a   -Daughter stated that patient had covid around September 10th, 2023   Anesthesia review:   Patient denies shortness of breath, fever, cough and chest pain over the phone call   All instructions explained to the patient's daughter, with a verbal understanding of the material. Patient agrees to go over the instructions while at home for a better understanding.

## 2022-07-12 ENCOUNTER — Ambulatory Visit (HOSPITAL_COMMUNITY): Payer: BC Managed Care – PPO | Admitting: Vascular Surgery

## 2022-07-12 ENCOUNTER — Other Ambulatory Visit: Payer: Self-pay

## 2022-07-12 ENCOUNTER — Inpatient Hospital Stay (HOSPITAL_COMMUNITY)
Admission: AD | Admit: 2022-07-12 | Discharge: 2022-07-16 | DRG: 483 | Disposition: A | Discharge status: Home Health | Payer: BC Managed Care – PPO | Attending: Orthopedic Surgery | Admitting: Orthopedic Surgery

## 2022-07-12 ENCOUNTER — Encounter (HOSPITAL_COMMUNITY): Payer: Self-pay | Admitting: Orthopedic Surgery

## 2022-07-12 ENCOUNTER — Inpatient Hospital Stay (HOSPITAL_COMMUNITY): Payer: BC Managed Care – PPO

## 2022-07-12 ENCOUNTER — Encounter (HOSPITAL_COMMUNITY)
Admission: AD | Disposition: A | Discharge status: Home Health | Payer: Self-pay | Source: Home / Self Care | Attending: Orthopedic Surgery

## 2022-07-12 DIAGNOSIS — I252 Old myocardial infarction: Secondary | ICD-10-CM | POA: Diagnosis not present

## 2022-07-12 DIAGNOSIS — M5020 Other cervical disc displacement, unspecified cervical region: Secondary | ICD-10-CM | POA: Diagnosis not present

## 2022-07-12 DIAGNOSIS — R262 Difficulty in walking, not elsewhere classified: Secondary | ICD-10-CM | POA: Diagnosis not present

## 2022-07-12 DIAGNOSIS — Z981 Arthrodesis status: Secondary | ICD-10-CM

## 2022-07-12 DIAGNOSIS — M5126 Other intervertebral disc displacement, lumbar region: Secondary | ICD-10-CM | POA: Diagnosis not present

## 2022-07-12 DIAGNOSIS — R531 Weakness: Secondary | ICD-10-CM | POA: Diagnosis not present

## 2022-07-12 DIAGNOSIS — Z96642 Presence of left artificial hip joint: Secondary | ICD-10-CM | POA: Diagnosis present

## 2022-07-12 DIAGNOSIS — M5023 Other cervical disc displacement, cervicothoracic region: Secondary | ICD-10-CM | POA: Diagnosis not present

## 2022-07-12 DIAGNOSIS — M48061 Spinal stenosis, lumbar region without neurogenic claudication: Secondary | ICD-10-CM | POA: Diagnosis not present

## 2022-07-12 DIAGNOSIS — E7800 Pure hypercholesterolemia, unspecified: Secondary | ICD-10-CM | POA: Diagnosis present

## 2022-07-12 DIAGNOSIS — Z471 Aftercare following joint replacement surgery: Secondary | ICD-10-CM | POA: Diagnosis not present

## 2022-07-12 DIAGNOSIS — M50221 Other cervical disc displacement at C4-C5 level: Secondary | ICD-10-CM | POA: Diagnosis not present

## 2022-07-12 DIAGNOSIS — Z96612 Presence of left artificial shoulder joint: Secondary | ICD-10-CM

## 2022-07-12 DIAGNOSIS — Z7901 Long term (current) use of anticoagulants: Secondary | ICD-10-CM | POA: Diagnosis not present

## 2022-07-12 DIAGNOSIS — S46012A Strain of muscle(s) and tendon(s) of the rotator cuff of left shoulder, initial encounter: Secondary | ICD-10-CM | POA: Diagnosis not present

## 2022-07-12 DIAGNOSIS — Z8673 Personal history of transient ischemic attack (TIA), and cerebral infarction without residual deficits: Secondary | ICD-10-CM

## 2022-07-12 DIAGNOSIS — M5136 Other intervertebral disc degeneration, lumbar region: Secondary | ICD-10-CM | POA: Diagnosis not present

## 2022-07-12 DIAGNOSIS — S72002A Fracture of unspecified part of neck of left femur, initial encounter for closed fracture: Principal | ICD-10-CM

## 2022-07-12 DIAGNOSIS — M4802 Spinal stenosis, cervical region: Secondary | ICD-10-CM | POA: Diagnosis not present

## 2022-07-12 DIAGNOSIS — S72002D Fracture of unspecified part of neck of left femur, subsequent encounter for closed fracture with routine healing: Secondary | ICD-10-CM | POA: Diagnosis not present

## 2022-07-12 DIAGNOSIS — Z86718 Personal history of other venous thrombosis and embolism: Secondary | ICD-10-CM | POA: Diagnosis not present

## 2022-07-12 DIAGNOSIS — M7502 Adhesive capsulitis of left shoulder: Secondary | ICD-10-CM | POA: Diagnosis present

## 2022-07-12 DIAGNOSIS — M5021 Other cervical disc displacement,  high cervical region: Secondary | ICD-10-CM | POA: Diagnosis not present

## 2022-07-12 DIAGNOSIS — J449 Chronic obstructive pulmonary disease, unspecified: Secondary | ICD-10-CM | POA: Diagnosis not present

## 2022-07-12 DIAGNOSIS — G8929 Other chronic pain: Secondary | ICD-10-CM | POA: Diagnosis not present

## 2022-07-12 DIAGNOSIS — M75102 Unspecified rotator cuff tear or rupture of left shoulder, not specified as traumatic: Principal | ICD-10-CM | POA: Diagnosis present

## 2022-07-12 DIAGNOSIS — I10 Essential (primary) hypertension: Secondary | ICD-10-CM

## 2022-07-12 DIAGNOSIS — Z79899 Other long term (current) drug therapy: Secondary | ICD-10-CM | POA: Diagnosis not present

## 2022-07-12 DIAGNOSIS — Z881 Allergy status to other antibiotic agents status: Secondary | ICD-10-CM | POA: Diagnosis not present

## 2022-07-12 DIAGNOSIS — R5383 Other fatigue: Secondary | ICD-10-CM | POA: Diagnosis not present

## 2022-07-12 DIAGNOSIS — L03116 Cellulitis of left lower limb: Secondary | ICD-10-CM | POA: Diagnosis not present

## 2022-07-12 DIAGNOSIS — I6782 Cerebral ischemia: Secondary | ICD-10-CM | POA: Diagnosis not present

## 2022-07-12 DIAGNOSIS — R270 Ataxia, unspecified: Secondary | ICD-10-CM | POA: Diagnosis not present

## 2022-07-12 DIAGNOSIS — Z87891 Personal history of nicotine dependence: Secondary | ICD-10-CM | POA: Diagnosis not present

## 2022-07-12 DIAGNOSIS — M7522 Bicipital tendinitis, left shoulder: Secondary | ICD-10-CM | POA: Diagnosis not present

## 2022-07-12 HISTORY — PX: REVERSE SHOULDER ARTHROPLASTY: SHX5054

## 2022-07-12 LAB — BASIC METABOLIC PANEL
Anion gap: 9 (ref 5–15)
BUN: 12 mg/dL (ref 8–23)
CO2: 25 mmol/L (ref 22–32)
Calcium: 8.9 mg/dL (ref 8.9–10.3)
Chloride: 104 mmol/L (ref 98–111)
Creatinine, Ser: 0.68 mg/dL (ref 0.61–1.24)
GFR, Estimated: >60 mL/min (ref >60)
Glucose, Bld: 95 mg/dL (ref 70–99)
Potassium: 4.4 mmol/L (ref 3.5–5.1)
Sodium: 138 mmol/L (ref 135–145)

## 2022-07-12 LAB — CBC
HCT: 48.5 % (ref 39.0–52.0)
Hemoglobin: 15.5 g/dL (ref 13.0–17.0)
MCH: 28.1 pg (ref 26.0–34.0)
MCHC: 32 g/dL (ref 30.0–36.0)
MCV: 88 fL (ref 80.0–100.0)
Platelets: 266 10*3/uL (ref 150–400)
RBC: 5.51 MIL/uL (ref 4.22–5.81)
RDW: 15.1 % (ref 11.5–15.5)
WBC: 6.3 10*3/uL (ref 4.0–10.5)
nRBC: 0 % (ref 0.0–0.2)

## 2022-07-12 LAB — SURGICAL PCR SCREEN
MRSA, PCR: NEGATIVE
Staphylococcus aureus: NEGATIVE

## 2022-07-12 SURGERY — ARTHROPLASTY, SHOULDER, TOTAL, REVERSE
Anesthesia: General | Site: Shoulder | Laterality: Left

## 2022-07-12 MED ORDER — BUPIVACAINE-EPINEPHRINE (PF) 0.5% -1:200000 IJ SOLN
INTRAMUSCULAR | Status: DC | PRN
  Administered 2022-07-12: 15 mL via PERINEURAL

## 2022-07-12 MED ORDER — ORAL CARE MOUTH RINSE: 15.0000 mL | Freq: Once | OROMUCOSAL | Status: AC

## 2022-07-12 MED ORDER — PHENYLEPHRINE HCL-NACL 20-0.9 MG/250ML-% IV SOLN
INTRAVENOUS | Status: DC | PRN
  Administered 2022-07-12: 25 ug/min via INTRAVENOUS

## 2022-07-12 MED ORDER — METOPROLOL TARTRATE 12.5 MG HALF TABLET
12.5000 mg | ORAL_TABLET | Freq: Two times a day (BID) | ORAL | Status: DC
  Administered 2022-07-12 – 2022-07-15 (×7): 12.5 mg via ORAL
  Filled 2022-07-12 (×9): qty 1

## 2022-07-12 MED ORDER — FENTANYL CITRATE (PF) 100 MCG/2ML IJ SOLN
INTRAMUSCULAR | Status: AC
  Administered 2022-07-12: 50 ug via INTRAVENOUS
  Filled 2022-07-12: qty 2

## 2022-07-12 MED ORDER — POLYETHYLENE GLYCOL 3350 17 G PO PACK: 17.0000 g | PACK | Freq: Every day | ORAL | Status: DC | PRN

## 2022-07-12 MED ORDER — NEOSTIGMINE METHYLSULFATE 10 MG/10ML IV SOLN
INTRAVENOUS | Status: DC | PRN
  Administered 2022-07-12: 3 mg via INTRAVENOUS

## 2022-07-12 MED ORDER — PHENYLEPHRINE 80 MCG/ML (10ML) SYRINGE FOR IV PUSH (FOR BLOOD PRESSURE SUPPORT)
PREFILLED_SYRINGE | INTRAVENOUS | Status: AC
  Filled 2022-07-12: qty 10

## 2022-07-12 MED ORDER — LIDOCAINE 2% (20 MG/ML) 5 ML SYRINGE
INTRAMUSCULAR | Status: DC | PRN
  Administered 2022-07-12: 100 mg via INTRAVENOUS

## 2022-07-12 MED ORDER — DOCUSATE SODIUM 100 MG PO CAPS
100.0000 mg | ORAL_CAPSULE | Freq: Two times a day (BID) | ORAL | Status: DC
  Administered 2022-07-12 – 2022-07-16 (×8): 100 mg via ORAL
  Filled 2022-07-12 (×8): qty 1

## 2022-07-12 MED ORDER — CHLORHEXIDINE GLUCONATE 0.12 % MT SOLN
OROMUCOSAL | Status: AC
  Administered 2022-07-12: 15 mL via OROMUCOSAL
  Filled 2022-07-12: qty 15

## 2022-07-12 MED ORDER — METOCLOPRAMIDE HCL 5 MG PO TABS: 5.0000 mg | ORAL_TABLET | Freq: Three times a day (TID) | ORAL | Status: DC | PRN

## 2022-07-12 MED ORDER — ACETAMINOPHEN 325 MG PO TABS: 325.0000 mg | ORAL_TABLET | Freq: Four times a day (QID) | ORAL | Status: DC | PRN

## 2022-07-12 MED ORDER — ROCURONIUM BROMIDE 10 MG/ML (PF) SYRINGE
PREFILLED_SYRINGE | INTRAVENOUS | Status: DC | PRN
  Administered 2022-07-12: 60 mg via INTRAVENOUS
  Administered 2022-07-12: 20 mg via INTRAVENOUS

## 2022-07-12 MED ORDER — OXYCODONE HCL 5 MG/5ML PO SOLN: 5.0000 mg | Freq: Once | ORAL | Status: DC | PRN

## 2022-07-12 MED ORDER — LACTATED RINGERS IV SOLN: INTRAVENOUS | Status: DC

## 2022-07-12 MED ORDER — OXYCODONE HCL 5 MG PO TABS: 5.0000 mg | ORAL_TABLET | Freq: Once | ORAL | Status: DC | PRN

## 2022-07-12 MED ORDER — ALBUMIN HUMAN 5 % IV SOLN: INTRAVENOUS | Status: DC | PRN

## 2022-07-12 MED ORDER — VASOPRESSIN 20 UNIT/ML IV SOLN
INTRAVENOUS | Status: AC
  Filled 2022-07-12: qty 1

## 2022-07-12 MED ORDER — HYDROCODONE-ACETAMINOPHEN 5-325 MG PO TABS: 1.0000 | ORAL_TABLET | Freq: Four times a day (QID) | ORAL | 0 refills | Status: AC | PRN

## 2022-07-12 MED ORDER — HYDROCODONE-ACETAMINOPHEN 5-325 MG PO TABS
1.0000 | ORAL_TABLET | ORAL | Status: DC | PRN
  Administered 2022-07-12 – 2022-07-15 (×6): 2 via ORAL
  Filled 2022-07-12 (×7): qty 2

## 2022-07-12 MED ORDER — APIXABAN 2.5 MG PO TABS
2.5000 mg | ORAL_TABLET | Freq: Two times a day (BID) | ORAL | Status: DC
  Administered 2022-07-13 – 2022-07-16 (×7): 2.5 mg via ORAL
  Filled 2022-07-12 (×8): qty 1

## 2022-07-12 MED ORDER — 0.9 % SODIUM CHLORIDE (POUR BTL) OPTIME
TOPICAL | Status: DC | PRN
  Administered 2022-07-12: 1000 mL

## 2022-07-12 MED ORDER — ONDANSETRON HCL 4 MG/2ML IJ SOLN
INTRAMUSCULAR | Status: DC | PRN
  Administered 2022-07-12: 4 mg via INTRAVENOUS

## 2022-07-12 MED ORDER — HYDROCODONE-ACETAMINOPHEN 5-325 MG PO TABS: 1.0000 | ORAL_TABLET | Freq: Four times a day (QID) | ORAL | 0 refills | Status: DC | PRN

## 2022-07-12 MED ORDER — FENTANYL CITRATE (PF) 100 MCG/2ML IJ SOLN: 25.0000 ug | INTRAMUSCULAR | Status: DC | PRN

## 2022-07-12 MED ORDER — BUPIVACAINE-EPINEPHRINE (PF) 0.25% -1:200000 IJ SOLN
INTRAMUSCULAR | Status: AC
  Filled 2022-07-12: qty 30

## 2022-07-12 MED ORDER — NEOSTIGMINE METHYLSULFATE 3 MG/3ML IV SOSY
PREFILLED_SYRINGE | INTRAVENOUS | Status: AC
  Filled 2022-07-12: qty 3

## 2022-07-12 MED ORDER — GLYCOPYRROLATE PF 0.2 MG/ML IJ SOSY
PREFILLED_SYRINGE | INTRAMUSCULAR | Status: DC | PRN
  Administered 2022-07-12: .4 mg via INTRAVENOUS

## 2022-07-12 MED ORDER — SODIUM CHLORIDE 0.9 % IV SOLN: INTRAVENOUS | Status: DC

## 2022-07-12 MED ORDER — PHENYLEPHRINE HCL (PRESSORS) 10 MG/ML IV SOLN
INTRAVENOUS | Status: DC | PRN
  Administered 2022-07-12 (×4): 160 ug via INTRAVENOUS

## 2022-07-12 MED ORDER — ONDANSETRON HCL 4 MG/2ML IJ SOLN: 4.0000 mg | Freq: Four times a day (QID) | INTRAMUSCULAR | Status: DC | PRN

## 2022-07-12 MED ORDER — TAMSULOSIN HCL 0.4 MG PO CAPS
0.4000 mg | ORAL_CAPSULE | Freq: Every day | ORAL | Status: DC
  Administered 2022-07-12 – 2022-07-16 (×5): 0.4 mg via ORAL
  Filled 2022-07-12 (×5): qty 1

## 2022-07-12 MED ORDER — LIDOCAINE 2% (20 MG/ML) 5 ML SYRINGE
INTRAMUSCULAR | Status: AC
  Filled 2022-07-12: qty 5

## 2022-07-12 MED ORDER — MORPHINE SULFATE (PF) 2 MG/ML IV SOLN: 0.5000 mg | INTRAVENOUS | Status: DC | PRN

## 2022-07-12 MED ORDER — FINASTERIDE 5 MG PO TABS
5.0000 mg | ORAL_TABLET | Freq: Every day | ORAL | Status: DC
  Administered 2022-07-12 – 2022-07-16 (×5): 5 mg via ORAL
  Filled 2022-07-12 (×5): qty 1

## 2022-07-12 MED ORDER — FENTANYL CITRATE (PF) 250 MCG/5ML IJ SOLN
INTRAMUSCULAR | Status: DC | PRN
  Administered 2022-07-12: 50 ug via INTRAVENOUS

## 2022-07-12 MED ORDER — BISACODYL 10 MG RE SUPP: 10.0000 mg | Freq: Every day | RECTAL | Status: DC | PRN

## 2022-07-12 MED ORDER — FENTANYL CITRATE (PF) 100 MCG/2ML IJ SOLN: 50.0000 ug | Freq: Once | INTRAMUSCULAR | Status: AC

## 2022-07-12 MED ORDER — MIDAZOLAM HCL 2 MG/2ML IJ SOLN
INTRAMUSCULAR | Status: AC
  Administered 2022-07-12: 1 mg via INTRAVENOUS
  Filled 2022-07-12: qty 2

## 2022-07-12 MED ORDER — DEXAMETHASONE SODIUM PHOSPHATE 10 MG/ML IJ SOLN
INTRAMUSCULAR | Status: AC
  Filled 2022-07-12: qty 1

## 2022-07-12 MED ORDER — ONDANSETRON HCL 4 MG/2ML IJ SOLN
INTRAMUSCULAR | Status: AC
  Filled 2022-07-12: qty 2

## 2022-07-12 MED ORDER — BUPIVACAINE LIPOSOME 1.3 % IJ SUSP
INTRAMUSCULAR | Status: DC | PRN
  Administered 2022-07-12: 10 mL via PERINEURAL

## 2022-07-12 MED ORDER — METHOCARBAMOL 500 MG PO TABS
500.0000 mg | ORAL_TABLET | Freq: Three times a day (TID) | ORAL | Status: DC | PRN
  Administered 2022-07-15: 500 mg via ORAL
  Filled 2022-07-12 (×2): qty 1

## 2022-07-12 MED ORDER — DOCUSATE SODIUM 100 MG PO CAPS: 100.0000 mg | ORAL_CAPSULE | Freq: Two times a day (BID) | ORAL | Status: DC

## 2022-07-12 MED ORDER — LISINOPRIL 10 MG PO TABS
10.0000 mg | ORAL_TABLET | Freq: Every evening | ORAL | Status: DC
  Administered 2022-07-12 – 2022-07-15 (×4): 10 mg via ORAL
  Filled 2022-07-12 (×5): qty 1

## 2022-07-12 MED ORDER — DEXAMETHASONE SODIUM PHOSPHATE 10 MG/ML IJ SOLN
INTRAMUSCULAR | Status: DC | PRN
  Administered 2022-07-12: 10 mg via INTRAVENOUS

## 2022-07-12 MED ORDER — TRANEXAMIC ACID-NACL 1000-0.7 MG/100ML-% IV SOLN
INTRAVENOUS | Status: AC
  Filled 2022-07-12: qty 100

## 2022-07-12 MED ORDER — EPHEDRINE 5 MG/ML INJ
INTRAVENOUS | Status: AC
  Filled 2022-07-12: qty 5

## 2022-07-12 MED ORDER — MIDAZOLAM HCL 2 MG/2ML IJ SOLN: 1.0000 mg | Freq: Once | INTRAMUSCULAR | Status: AC

## 2022-07-12 MED ORDER — DICLOFENAC SODIUM 1 % EX GEL
2.0000 g | Freq: Three times a day (TID) | CUTANEOUS | Status: DC
  Administered 2022-07-12 – 2022-07-16 (×12): 2 g via TOPICAL
  Filled 2022-07-12: qty 100

## 2022-07-12 MED ORDER — ATORVASTATIN CALCIUM 10 MG PO TABS
20.0000 mg | ORAL_TABLET | Freq: Every day | ORAL | Status: DC
  Administered 2022-07-12 – 2022-07-15 (×4): 20 mg via ORAL
  Filled 2022-07-12 (×5): qty 2

## 2022-07-12 MED ORDER — PHENOL 1.4 % MT LIQD: 1.0000 | OROMUCOSAL | Status: DC | PRN

## 2022-07-12 MED ORDER — PROPOFOL 10 MG/ML IV BOLUS
INTRAVENOUS | Status: DC | PRN
  Administered 2022-07-12: 160 mg via INTRAVENOUS

## 2022-07-12 MED ORDER — CELECOXIB 100 MG PO CAPS
100.0000 mg | ORAL_CAPSULE | Freq: Every day | ORAL | Status: DC
  Administered 2022-07-13: 100 mg via ORAL
  Filled 2022-07-12: qty 1

## 2022-07-12 MED ORDER — MENTHOL 3 MG MT LOZG: 1.0000 | LOZENGE | OROMUCOSAL | Status: DC | PRN

## 2022-07-12 MED ORDER — PROPOFOL 10 MG/ML IV BOLUS
INTRAVENOUS | Status: AC
  Filled 2022-07-12: qty 20

## 2022-07-12 MED ORDER — FERROUS SULFATE 325 (65 FE) MG PO TABS
325.0000 mg | ORAL_TABLET | Freq: Three times a day (TID) | ORAL | Status: DC
  Administered 2022-07-12 – 2022-07-16 (×11): 325 mg via ORAL
  Filled 2022-07-12 (×12): qty 1

## 2022-07-12 MED ORDER — ROCURONIUM BROMIDE 10 MG/ML (PF) SYRINGE
PREFILLED_SYRINGE | INTRAVENOUS | Status: AC
  Filled 2022-07-12: qty 10

## 2022-07-12 MED ORDER — FENTANYL CITRATE (PF) 250 MCG/5ML IJ SOLN
INTRAMUSCULAR | Status: AC
  Filled 2022-07-12: qty 5

## 2022-07-12 MED ORDER — VITAMIN C 500 MG PO TABS
1000.0000 mg | ORAL_TABLET | Freq: Two times a day (BID) | ORAL | Status: DC
  Administered 2022-07-12 – 2022-07-16 (×8): 1000 mg via ORAL
  Filled 2022-07-12 (×9): qty 2

## 2022-07-12 MED ORDER — EPHEDRINE SULFATE (PRESSORS) 50 MG/ML IJ SOLN
INTRAMUSCULAR | Status: DC | PRN
  Administered 2022-07-12 (×3): 5 mg via INTRAVENOUS

## 2022-07-12 MED ORDER — CEFAZOLIN SODIUM-DEXTROSE 2-4 GM/100ML-% IV SOLN
2.0000 g | Freq: Four times a day (QID) | INTRAVENOUS | Status: AC
  Administered 2022-07-13 (×2): 2 g via INTRAVENOUS
  Filled 2022-07-12 (×3): qty 100

## 2022-07-12 MED ORDER — BUPIVACAINE-EPINEPHRINE 0.25% -1:200000 IJ SOLN
INTRAMUSCULAR | Status: DC | PRN
  Administered 2022-07-12: 7 mL

## 2022-07-12 MED ORDER — TRANEXAMIC ACID-NACL 1000-0.7 MG/100ML-% IV SOLN
INTRAVENOUS | Status: DC | PRN
  Administered 2022-07-12: 1000 mg via INTRAVENOUS

## 2022-07-12 MED ORDER — ONDANSETRON HCL 4 MG PO TABS: 4.0000 mg | ORAL_TABLET | Freq: Four times a day (QID) | ORAL | Status: DC | PRN

## 2022-07-12 MED ORDER — CEFAZOLIN SODIUM-DEXTROSE 2-4 GM/100ML-% IV SOLN
2.0000 g | INTRAVENOUS | Status: AC
  Administered 2022-07-12: 2 g via INTRAVENOUS
  Filled 2022-07-12: qty 100

## 2022-07-12 MED ORDER — GLYCOPYRROLATE PF 0.2 MG/ML IJ SOSY
PREFILLED_SYRINGE | INTRAMUSCULAR | Status: AC
  Filled 2022-07-12: qty 2

## 2022-07-12 MED ORDER — VASOPRESSIN 20 UNIT/ML IV SOLN
INTRAVENOUS | Status: DC | PRN
  Administered 2022-07-12 (×7): 1 [IU] via INTRAVENOUS

## 2022-07-12 MED ORDER — METOCLOPRAMIDE HCL 5 MG/ML IJ SOLN: 5.0000 mg | Freq: Three times a day (TID) | INTRAMUSCULAR | Status: DC | PRN

## 2022-07-12 MED ORDER — CHLORHEXIDINE GLUCONATE 0.12 % MT SOLN: 15.0000 mL | Freq: Once | OROMUCOSAL | Status: AC

## 2022-07-12 SURGICAL SUPPLY — 81 items
BAG COUNTER SPONGE SURGICOUNT (BAG) ×1 IMPLANT
BIT DRILL 170X2.5X (BIT) IMPLANT
BIT DRILL 5/64X5 DISP (BIT) ×1 IMPLANT
BIT DRL 170X2.5X (BIT) ×1
BLADE SAG 18X100X1.27 (BLADE) ×1 IMPLANT
COVER SURGICAL LIGHT HANDLE (MISCELLANEOUS) ×1 IMPLANT
CUP HUMERAL 42 PLUS 3 (Orthopedic Implant) IMPLANT
DRAPE INCISE IOBAN 66X45 STRL (DRAPES) ×1 IMPLANT
DRAPE ORTHO SPLIT 77X108 STRL (DRAPES) ×2
DRAPE SURG ORHT 6 SPLT 77X108 (DRAPES) ×2 IMPLANT
DRAPE U-SHAPE 47X51 STRL (DRAPES) ×1 IMPLANT
DRILL 2.5 (BIT) ×1
DRSG ADAPTIC 3X8 NADH LF (GAUZE/BANDAGES/DRESSINGS) ×1 IMPLANT
DURAPREP 26ML APPLICATOR (WOUND CARE) ×1 IMPLANT
ELECT BLADE 4.0 EZ CLEAN MEGAD (MISCELLANEOUS) ×1
ELECT NDL TIP 2.8 STRL (NEEDLE) ×1 IMPLANT
ELECT NEEDLE TIP 2.8 STRL (NEEDLE) IMPLANT
ELECT REM PT RETURN 9FT ADLT (ELECTROSURGICAL) ×1
ELECTRODE BLDE 4.0 EZ CLN MEGD (MISCELLANEOUS) ×1 IMPLANT
ELECTRODE REM PT RTRN 9FT ADLT (ELECTROSURGICAL) ×1 IMPLANT
EPIPHSYSI CENTER SZ 2 LT (Shoulder) ×1 IMPLANT
EPIPHYSIS CENTER SZ 2 LT (Shoulder) IMPLANT
FACESHIELD WRAPAROUND (MASK) ×1 IMPLANT
FACESHIELD WRAPAROUND OR TEAM (MASK) IMPLANT
GAUZE PAD ABD 7.5X8 STRL (GAUZE/BANDAGES/DRESSINGS) IMPLANT
GAUZE PAD ABD 8X10 STRL (GAUZE/BANDAGES/DRESSINGS) ×1 IMPLANT
GAUZE SPONGE 4X4 12PLY STRL (GAUZE/BANDAGES/DRESSINGS) ×1 IMPLANT
GAUZE SPONGE 4X4 12PLY STRL LF (GAUZE/BANDAGES/DRESSINGS) IMPLANT
GLENOSPHERE XTEND LAT 42+0 STD (Miscellaneous) IMPLANT
GLOVE ORTHO TXT STRL SZ7.5 (GLOVE) ×1 IMPLANT
GLOVE PI ORTHO PRO STRL 7.5 (GLOVE) ×1 IMPLANT
GLOVE PI ORTHO PRO STRL SZ8 (GLOVE) ×1 IMPLANT
GLOVE SURG ORTHO 8.5 STRL (GLOVE) ×1 IMPLANT
GOWN STRL REUS W/ TWL LRG LVL3 (GOWN DISPOSABLE) ×1 IMPLANT
GOWN STRL REUS W/ TWL XL LVL3 (GOWN DISPOSABLE) ×2 IMPLANT
GOWN STRL REUS W/TWL LRG LVL3 (GOWN DISPOSABLE) ×1
GOWN STRL REUS W/TWL XL LVL3 (GOWN DISPOSABLE) ×2
KIT BASIN OR (CUSTOM PROCEDURE TRAY) ×1 IMPLANT
KIT TURNOVER KIT B (KITS) ×1 IMPLANT
MANIFOLD NEPTUNE II (INSTRUMENTS) ×1 IMPLANT
METAGLENE DELTA EXTEND (Trauma) IMPLANT
METAGLENE DXTEND (Trauma) ×1 IMPLANT
NDL 1/2 CIR MAYO (NEEDLE) ×1 IMPLANT
NDL HYPO 25GX1X1/2 BEV (NEEDLE) ×1 IMPLANT
NEEDLE 1/2 CIR MAYO (NEEDLE) IMPLANT
NEEDLE HYPO 25GX1X1/2 BEV (NEEDLE) ×1 IMPLANT
NS IRRIG 1000ML POUR BTL (IV SOLUTION) ×1 IMPLANT
PACK SHOULDER (CUSTOM PROCEDURE TRAY) ×1 IMPLANT
PAD ARMBOARD 7.5X6 YLW CONV (MISCELLANEOUS) ×2 IMPLANT
PIN GUIDE 1.2 (PIN) IMPLANT
PIN GUIDE GLENOPHERE 1.5MX300M (PIN) IMPLANT
PIN METAGLENE 2.5 (PIN) IMPLANT
RESTRAINT HEAD UNIVERSAL NS (MISCELLANEOUS) ×1 IMPLANT
SCREW 4.5X18MM (Screw) ×1 IMPLANT
SCREW 4.5X24MM (Screw) ×1 IMPLANT
SCREW 4.5X36MM (Screw) IMPLANT
SCREW BN 18X4.5XSTRL SHLDR (Screw) IMPLANT
SCREW BN 24X4.5XLCK STRL (Screw) IMPLANT
SCREW LOCK DELTA XTEND 4.5X30 (Screw) IMPLANT
SLING ARM FOAM STRAP LRG (SOFTGOODS) IMPLANT
SPONGE T-LAP 18X18 ~~LOC~~+RFID (SPONGE) IMPLANT
SPONGE T-LAP 4X18 ~~LOC~~+RFID (SPONGE) ×1 IMPLANT
STEM 12 HA (Stem) IMPLANT
STRIP CLOSURE SKIN 1/2X4 (GAUZE/BANDAGES/DRESSINGS) ×1 IMPLANT
SUCTION FRAZIER HANDLE 10FR (MISCELLANEOUS) ×1
SUCTION TUBE FRAZIER 10FR DISP (MISCELLANEOUS) ×1 IMPLANT
SUT FIBERWIRE #2 38 T-5 BLUE (SUTURE) ×2
SUT MNCRL AB 4-0 PS2 18 (SUTURE) ×1 IMPLANT
SUT VIC AB 0 CT2 27 (SUTURE) ×1 IMPLANT
SUT VIC AB 2-0 CT1 27 (SUTURE) ×2
SUT VIC AB 2-0 CT1 TAPERPNT 27 (SUTURE) ×1 IMPLANT
SUT VICRYL 0 CT 1 36IN (SUTURE) ×1 IMPLANT
SUT VICRYL AB 2 0 TIES (SUTURE) IMPLANT
SUTURE FIBERWR #2 38 T-5 BLUE (SUTURE) IMPLANT
SYR CONTROL 10ML LL (SYRINGE) ×1 IMPLANT
TAPE CLOTH 4X10 WHT NS (GAUZE/BANDAGES/DRESSINGS) IMPLANT
TAPE CLOTH SOFT 2X10 (GAUZE/BANDAGES/DRESSINGS) IMPLANT
TOWEL GREEN STERILE (TOWEL DISPOSABLE) ×1 IMPLANT
TOWEL GREEN STERILE FF (TOWEL DISPOSABLE) ×1 IMPLANT
TOWER CARTRIDGE SMART MIX (DISPOSABLE) IMPLANT
YANKAUER SUCT BULB TIP NO VENT (SUCTIONS) ×1 IMPLANT

## 2022-07-12 NOTE — Discharge Instructions (Addendum)
Ice to the shoulder constantly.  Keep the incision covered and clean and dry for one week, then ok to get it wet in the shower. Daily dry dressing changes with dry gauze.   Do exercise as instructed several times per day. Be careful with mobilizing and during transfers  DO NOT reach behind your back or push up out of a chair with the operative arm.  Use a sling while you are up and around for comfort, may remove while seated.  Keep pillow propped behind the operative elbow.  Follow up with Dr Veverly Fells in two weeks in the office, call [phone] for appt   ======================================================================================== Information on my medicine - ELIQUIS (apixaban)  This medication education was reviewed with me or my healthcare representative as part of my discharge preparation.   Why was Eliquis prescribed for you? Eliquis was prescribed for prior blood clots that may have been found in the veins of your legs in the past (deep vein thrombosis) and to reduce the risk of them occurring again.  What do You need to know about Eliquis ? Your current dose is ONE 2.5 mg tablet taken TWICE daily.  Eliquis may be taken with or without food.   Try to take the dose about the same time in the morning and in the evening. If you have difficulty swallowing the tablet whole please discuss with your pharmacist how to take the medication safely.  Take Eliquis exactly as prescribed and DO NOT stop taking Eliquis without talking to the doctor who prescribed the medication.  Stopping may increase your risk of developing a new blood clot.  Refill your prescription before you run out.  After discharge, you should have regular check-up appointments with your healthcare provider that is prescribing your Eliquis.    What do you do if you miss a dose? If a dose of ELIQUIS is not taken at the scheduled time, take it as soon as possible on the same day and twice-daily  administration should be resumed. The dose should not be doubled to make up for a missed dose.  Important Safety Information A possible side effect of Eliquis is bleeding. You should call your healthcare provider right away if you experience any of the following: Bleeding from an injury or your nose that does not stop. Unusual colored urine (red or dark brown) or unusual colored stools (red or black). Unusual bruising for unknown reasons. A serious fall or if you hit your head (even if there is no bleeding).  Some medicines may interact with Eliquis and might increase your risk of bleeding or clotting while on Eliquis. To help avoid this, consult your healthcare provider or pharmacist prior to using any new prescription or non-prescription medications, including herbals, vitamins, non-steroidal anti-inflammatory drugs (NSAIDs) and supplements.  This website has more information on Eliquis (apixaban): http://www.eliquis.com/eliquis/home

## 2022-07-12 NOTE — Anesthesia Procedure Notes (Addendum)
Procedure Name: Intubation Date/Time: 07/12/2022 3:29 PM  Performed by: Ester Rink, CRNAPre-anesthesia Checklist: Patient identified, Emergency Drugs available, Suction available and Patient being monitored Patient Re-evaluated:Patient Re-evaluated prior to induction Oxygen Delivery Method: Circle system utilized Preoxygenation: Pre-oxygenation with 100% oxygen Induction Type: IV induction Ventilation: Two handed mask ventilation required Laryngoscope Size: Mac and 4 Grade View: Grade II Tube type: Oral Tube size: 7.5 mm Number of attempts: 1 Airway Equipment and Method: Stylet and Oral airway Placement Confirmation: ETT inserted through vocal cords under direct vision, positive ETCO2 and breath sounds checked- equal and bilateral Secured at: 22 cm Tube secured with: Tape Dental Injury: Teeth and Oropharynx as per pre-operative assessment

## 2022-07-12 NOTE — Transfer of Care (Signed)
Immediate Anesthesia Transfer of Care Note  Patient: Alex [name]  Procedure(s) Performed: Left REVERSE SHOULDER ARTHROPLASTY (Left: Shoulder)  Patient Location: PACU  Anesthesia Type:General  Level of Consciousness: drowsy  Airway & Oxygen Therapy: Patient Spontanous Breathing and Patient connected to face mask oxygen  Post-op Assessment: Report given to RN and Post -op Vital signs reviewed and stable  Post vital signs: Reviewed and stable  Last Vitals:  Vitals Value Taken Time  BP 129/74 07/12/22 1715  Temp    Pulse 81 07/12/22 1717  Resp 20 07/12/22 1719  SpO2 98 % 07/12/22 1717  Vitals shown include unvalidated device data.  Last Pain:  Vitals:   07/12/22 1248  TempSrc:   PainSc: 0-No pain         Complications: There were no known notable events for this encounter.

## 2022-07-12 NOTE — Anesthesia Procedure Notes (Signed)
Anesthesia Regional Block: Interscalene brachial plexus block   Pre-Anesthetic Checklist: , timeout performed,  Correct Patient, Correct Site, Correct Laterality,  Correct Procedure, Correct Position, site marked,  Risks and benefits discussed,  Pre-op evaluation,  At surgeon's request and post-op pain management  Laterality: Left  Prep: Maximum Sterile Barrier Precautions used, chloraprep       Needles:  Injection technique: Single-shot  Needle Type: Echogenic Stimulator Needle     Needle Length: 4cm  Needle Gauge: 22     Additional Needles:   Procedures:,,,, ultrasound used (permanent image in chart),,    Narrative:  Start time: 07/12/2022 2:13 PM End time: 07/12/2022 2:15 PM Injection made incrementally with aspirations every 5 mL.  Performed by: Personally  Anesthesiologist: Brennan Bailey, MD  Additional Notes: Risks, benefits, and alternative discussed. Patient gave consent for procedure. Patient prepped and draped in sterile fashion. Sedation administered, patient remains easily responsive to voice. Relevant anatomy identified with ultrasound guidance. Local anesthetic given in 5cc increments with no signs or symptoms of intravascular injection. No pain or paraesthesias with injection. Patient monitored throughout procedure with signs of LAST or immediate complications. Tolerated well. Ultrasound image placed in chart.  Tawny Asal, MD

## 2022-07-12 NOTE — Interval H&P Note (Signed)
History and Physical Interval Note:  07/12/2022 3:13 PM  Alex [name]  has presented today for surgery, with the diagnosis of left shoulder rotator cuff tear.  The various methods of treatment have been discussed with the patient and family. After consideration of risks, benefits and other options for treatment, the patient has consented to  Procedure(s) with comments: Left REVERSE SHOULDER ARTHROPLASTY (Left) - choice and interscalene block as a surgical intervention.  The patient's history has been reviewed, patient examined, no change in status, stable for surgery.  I have reviewed the patient's chart and labs.  Questions were answered to the patient's satisfaction.     Augustin Schooling

## 2022-07-12 NOTE — Brief Op Note (Signed)
07/12/2022  5:07 PM  PATIENT:  Alex [name]  71 y.o. male  PRE-OPERATIVE DIAGNOSIS:  left shoulder rotator cuff tear, shoulder arthritis  POST-OPERATIVE DIAGNOSIS:  left shoulder rotator cuff tear, shoulder arthritis, frozen shoulder   PROCEDURE:  Procedure(s) with comments: Left REVERSE SHOULDER ARTHROPLASTY (Left) - choice and interscalene block NO Subscap repair  SURGEON:  Surgeon(s) and Role:    Netta Cedars, MD - Primary  PHYSICIAN ASSISTANT:   ASSISTANTS: Ventura Bruns, PA-C   ANESTHESIA:   regional and general  EBL:  150 mL   BLOOD ADMINISTERED:none  DRAINS: none   LOCAL MEDICATIONS USED:  MARCAINE     SPECIMEN:  No Specimen  DISPOSITION OF SPECIMEN:  N/A  COUNTS:  YES  TOURNIQUET:  * No tourniquets in log *  DICTATION: .Other Dictation: Dictation Number [id number]  PLAN OF CARE: Admit to inpatient   PATIENT DISPOSITION:  PACU - hemodynamically stable.   Delay start of Pharmacological VTE agent (>24hrs) due to surgical blood loss or risk of bleeding: not applicable

## 2022-07-12 NOTE — Anesthesia Postprocedure Evaluation (Signed)
Anesthesia Post Note  Patient: Alex [name]  Procedure(s) Performed: Left REVERSE SHOULDER ARTHROPLASTY (Left: Shoulder)     Patient location during evaluation: PACU Anesthesia Type: General Level of consciousness: awake and alert Pain management: pain level controlled Vital Signs Assessment: post-procedure vital signs reviewed and stable Respiratory status: spontaneous breathing, nonlabored ventilation and respiratory function stable Cardiovascular status: blood pressure returned to baseline Postop Assessment: no apparent nausea or vomiting Anesthetic complications: no   There were no known notable events for this encounter.  Last Vitals:  Vitals:   07/12/22 1800 07/12/22 1829  BP: 114/68 116/80  Pulse: 84 (!) 103  Resp: 14   Temp: 36.7 C   SpO2: 95% 97%    Last Pain:  Vitals:   07/12/22 1840  TempSrc:   PainSc: 0-No pain                 Marthenia Rolling

## 2022-07-13 ENCOUNTER — Encounter (HOSPITAL_COMMUNITY): Payer: Self-pay | Admitting: Orthopedic Surgery

## 2022-07-13 ENCOUNTER — Inpatient Hospital Stay (HOSPITAL_COMMUNITY): Payer: BC Managed Care – PPO

## 2022-07-13 DIAGNOSIS — M48061 Spinal stenosis, lumbar region without neurogenic claudication: Secondary | ICD-10-CM

## 2022-07-13 LAB — HEMOGLOBIN AND HEMATOCRIT, BLOOD
HCT: 40.3 % (ref 39.0–52.0)
Hemoglobin: 13.5 g/dL (ref 13.0–17.0)

## 2022-07-13 LAB — BASIC METABOLIC PANEL
Anion gap: 6 (ref 5–15)
BUN: 11 mg/dL (ref 8–23)
CO2: 24 mmol/L (ref 22–32)
Calcium: 8.7 mg/dL — ABNORMAL LOW (ref 8.9–10.3)
Chloride: 105 mmol/L (ref 98–111)
Creatinine, Ser: 0.76 mg/dL (ref 0.61–1.24)
GFR, Estimated: >60 mL/min (ref >60)
Glucose, Bld: 148 mg/dL — ABNORMAL HIGH (ref 70–99)
Potassium: 4.5 mmol/L (ref 3.5–5.1)
Sodium: 135 mmol/L (ref 135–145)

## 2022-07-13 LAB — TSH: TSH: 0.58 u[IU]/mL (ref 0.350–4.500)

## 2022-07-13 LAB — VITAMIN B12: Vitamin B-12: 298 pg/mL (ref 180–914)

## 2022-07-13 NOTE — Evaluation (Signed)
Occupational Therapy Evaluation Patient Details Name: Alex [name] MRN: [id number] DOB: 1951-02-23 Today's Date: 07/13/2022   History of Present Illness Pt is a 71 y/o male who presented for L reverse shoulder arthroplasty on 11/13 in setting of L rotator cuff tear. PMH: HTN, DVT, ACDF (2019), THA (02/2022)   Clinical Impression   PTA, admitted from SNF rehab s/p sx noted above with nerve block still in effect. Pt with deficits in L UE use, standing balance and overall strength. With pt reliance on RW since hip sx for mobility, continued efforts needed to find most appropriate AD for pt to use. Trialed SPC with noted unsteadiness and Min A needed throughout transfers. Pt requires up to Max A for UB/LB ADLs due to deficits. Provided shoulder handouts and initial education following shoulder sx - session abbreviated due to transport arrival for MRI transport. Based on high fall risk and reports of no 24/7 assist available at DC, recommend DC back to SNF rehab. Noted op note with allowance for WB relatively quickly during recovery so pt may progress quickly once allowed to use L UE more during tasks.       Recommendations for follow up therapy are one component of a multi-disciplinary discharge planning process, led by the attending physician.  Recommendations may be updated based on patient status, additional functional criteria and insurance authorization.   Follow Up Recommendations  Skilled nursing-short term rehab (<3 hours/day)     Assistance Recommended at Discharge Frequent or constant Supervision/Assistance  Patient can return home with the following A lot of help with walking and/or transfers;A lot of help with bathing/dressing/bathroom    Functional Status Assessment  Patient has had a recent decline in their functional status and demonstrates the ability to make significant improvements in function in a reasonable and predictable amount of time.  Equipment Recommendations  Other  (comment) (TBD pending progress)    Recommendations for Other Services       Precautions / Restrictions Precautions Precautions: Fall;Shoulder Shoulder Interventions: Shoulder sling/immobilizer;Off for dressing/bathing/exercises Precaution Booklet Issued: Yes (comment) Precaution Comments: no A/PROM of L shoulder, AROM of hand/wrist/elbow ok Restrictions Weight Bearing Restrictions: Yes LUE Weight Bearing: Non weight bearing      Mobility Bed Mobility Overal bed mobility: Needs Assistance Bed Mobility: Supine to Sit, Sit to Supine     Supine to sit: Supervision, HOB elevated Sit to supine: Supervision   General bed mobility comments: increased time/effort, use of bedrail    Transfers Overall transfer level: Needs assistance Equipment used: 1 person hand held assist, Straight cane Transfers: Sit to/from Stand, Bed to chair/wheelchair/BSC Sit to Stand: Min assist Stand pivot transfers: Min assist         General transfer comment: Trial of SPC at bedside though difficulty getting feet flat on floor/gaining balance. use of recliner armrest to pivot to chair with Min A to guide hips and handheld assist back to bed for transport to MRI      Balance Overall balance assessment: Needs assistance, History of Falls Sitting-balance support: No upper extremity supported, Feet supported Sitting balance-Leahy Scale: Fair     Standing balance support: Single extremity supported, During functional activity Standing balance-Leahy Scale: Poor                             ADL either performed or assessed with clinical judgement   ADL Overall ADL's : Needs assistance/impaired Eating/Feeding: Set up Eating/Feeding Details (indicate cue type and  reason): assist to put jelly on toast Grooming: Minimal assistance;Sitting   Upper Body Bathing: Moderate assistance;Sitting   Lower Body Bathing: Moderate assistance;Sit to/from stand   Upper Body Dressing : Maximal  assistance;Sitting   Lower Body Dressing: Maximal assistance;Sit to/from stand   Toilet Transfer: Minimal assistance;Stand-pivot;Squat-pivot   Toileting- Clothing Manipulation and Hygiene: Moderate assistance;Sit to/from stand         General ADL Comments: Limited by impaired use of L UE and prior reliance on RW use for mobility since hip sx a few months ago. Unsteady on feet with trial of cane     Vision Baseline Vision/History: 1 Wears glasses Ability to See in Adequate Light: 0 Adequate Patient Visual Report: No change from baseline Vision Assessment?: No apparent visual deficits     Perception     Praxis      Pertinent Vitals/Pain Pain Assessment Pain Assessment: No/denies pain (nerve block)     Hand Dominance Right   Extremity/Trunk Assessment Upper Extremity Assessment Upper Extremity Assessment: LUE deficits/detail LUE Deficits / Details: in sling, numb in first 3 digits though able to wiggle them. nerve block still in effect mostly LUE: Unable to fully assess due to immobilization LUE Sensation: decreased light touch LUE Coordination: decreased gross motor   Lower Extremity Assessment Lower Extremity Assessment: Defer to PT evaluation   Cervical / Trunk Assessment Cervical / Trunk Assessment: Normal   Communication Communication Communication: HOH   Cognition Arousal/Alertness: Awake/alert Behavior During Therapy: WFL for tasks assessed/performed Overall Cognitive Status: Within Functional Limits for tasks assessed                                       General Comments       Exercises     Shoulder Instructions      Home Living Family/patient expects to be discharged to:: Private residence Living Arrangements: Alone Available Help at Discharge: Family;Other (Comment) (75% of the time) Type of Home: House Home Access: Stairs to enter [employer] of Steps: 3-6   Home Layout: Two level (at pt's townhome; daughter's  home is one level) Alternate Level Stairs-Number of Steps: 14 Alternate Level Stairs-Rails: Left Bathroom Shower/Tub: Teacher, early years/pre: Standard     Home Equipment: Conservation officer, nature (2 wheels)   Additional Comments: DC to SNF rehab [facility]) after hip sx in July, admitted to hospital from SNF rehab. reports daughter works from home and grandson took 2 days off but pt reports 24/7 assist not available      Prior Functioning/Environment Prior Level of Function : Independent/Modified Independent             Mobility Comments: was using RW for mobility at SNF rehab, able to walk down 2 hallways before rest break needed per pt ADLs Comments: Light assist for ADLs at [facility] rehab though previously independent        OT Problem List: Decreased strength;Decreased activity tolerance;Impaired balance (sitting and/or standing);Decreased coordination;Decreased knowledge of use of DME or AE;Impaired UE functional use      OT Treatment/Interventions: Self-care/ADL training;Therapeutic exercise;Energy conservation;DME and/or AE instruction;Therapeutic activities;Patient/family education    OT Goals(Current goals can be found in the care plan section) Acute Rehab OT Goals Patient Stated Goal: be able to go home OT Goal Formulation: With patient Time For Goal Achievement: 07/27/22 Potential to Achieve Goals: Good  OT Frequency: Min 2X/week    Co-evaluation  AM-PAC OT "6 Clicks" Daily Activity     Outcome Measure Help from another person eating meals?: A Little Help from another person taking care of personal grooming?: A Little Help from another person toileting, which includes using toliet, bedpan, or urinal?: A Lot Help from another person bathing (including washing, rinsing, drying)?: A Lot Help from another person to put on and taking off regular upper body clothing?: A Lot Help from another person to put on and taking off regular lower body  clothing?: A Lot 6 Click Score: 14   End of Session Equipment Utilized During Treatment: Gait belt Nurse Communication: Mobility status  Activity Tolerance: Patient tolerated treatment well Patient left: in bed;with call bell/phone within reach;Other (comment) (with transport)  OT Visit Diagnosis: Other abnormalities of gait and mobility (R26.89);Unsteadiness on feet (R26.81)                Time: 9767-3419 OT Time Calculation (min): 29 min Charges:  OT General Charges $OT Visit: 1 Visit OT Evaluation $OT Eval Moderate Complexity: 1 Mod OT Treatments $Therapeutic Activity: 8-22 mins  Malachy Chamber, OTR/L Acute Rehab Services Office: [phone]   Layla Maw 07/13/2022, 8:47 AM

## 2022-07-13 NOTE — Progress Notes (Signed)
Orthopedics Progress Note  Subjective: Patient reporting tingling but no pain due to block.  Objective:  Vitals:   07/12/22 1958 07/12/22 2350  BP: 108/77 118/70  Pulse: (!) 102 (!) 106  Resp: 17 19  Temp: 97.8 F (36.6 C) 98 F (36.7 C)  SpO2: 96% 97%    General: Awake and alert and oriented Musculoskeletal: Left shoulder dressing CDI. Block still in place, otherwise Neurovascularly intact  Lab Results  Component Value Date   WBC 6.3 07/12/2022   HGB 13.5 07/13/2022   HCT 40.3 07/13/2022   MCV 88.0 07/12/2022   PLT 266 07/12/2022       Component Value Date/Time   NA 135 07/13/2022 0333   K 4.5 07/13/2022 0333   CL 105 07/13/2022 0333   CO2 24 07/13/2022 0333   GLUCOSE 148 (H) 07/13/2022 0333   BUN 11 07/13/2022 0333   CREATININE 0.76 07/13/2022 0333   CALCIUM 8.7 (L) 07/13/2022 0333   GFRNONAA >60 07/13/2022 0333   GFRAA >60 04/19/2019 0731    Lab Results  Component Value Date   INR 1.1 03/21/2022   INR 1.06 11/28/2017    Assessment/Plan: POD #1 s/p Procedure(s): Left REVERSE SHOULDER ARTHROPLASTY Stable this AM from ortho standpoint. Patient needs OT and PT work and assessment today. Was a long term resident of SNF following his fall and hip surgery MONTHS ago.  Daughter reports concerns over balance and potential neuro issues that concern her for discharge to home where he will have some family support.  They have not received home equipment or confirmation of home therapy and aide help. Discharge plans to follow.  Will have Neuro consult on patient as well.   Doran Heater. Veverly Fells, MD 07/13/2022 7:32 AM

## 2022-07-13 NOTE — Consult Note (Signed)
Neurology Consultation  Reason for Consult: Leg weakness, gait disturbance that has been progressive Referring Physician: Dr. Veverly Fells, orthopedics  CC: Left leg weakness and gait disturbance  History is obtained from: Patient, chart  HPI: Alex [name] is a 71 y.o. male past medical history of DVT on anticoagulation, hypertension, hypercholesterolemia, presented to the hospital for treatment of his: Left rotator cuff and neurology was asked to consult because of continued ongoing gait issues that he has been having since a fracture of July this year. Patient had a mechanical fall and sustained a left hip fracture that was repaired in July.  He went to rehabilitation but 4 months in rehabilitation and still had trouble with ambulation and uses a walker to walk.  He recently had his walker slipped from under him, fell on his shoulder and hurt his shoulder-has a rotator cuff injury.  Because he requires his upper extremities for transfers and ambulation, he was taken in for the left shoulder surgery.  While doing discharge planning, the family raised concerns for the difficulty with gait and concern for a neurological issue probably underlying which is hindering his progression from the gait standpoint. Patient has had prior cervical spinal surgeries and has had lumbar imaging in July which revealed acute to subacute compression fractures involving the superior endplate of the L2 with additional acute to subacute burst type compression fractures of the L3 vertebral body and degenerative disc bulges L4-5 resulting in moderate left worse than right L4 foraminal stenosis.  ROS: Full ROS was performed and is negative except as noted in the HPI.   Past Medical History:  Diagnosis Date   DVT (deep venous thrombosis) ([place]) 04/23/2016   LLE   Headache    Hypercholesterolemia    Hypertension      Family History  Problem Relation Age of Onset   Breast cancer Mother    Colon cancer Neg Hx    Rectal  cancer Neg Hx    Stomach cancer Neg Hx    Esophageal cancer Neg Hx      Social History:   reports that he has quit smoking. His smokeless tobacco use includes snuff. He reports current alcohol use. He reports that he does not use drugs.  Medications  Current Facility-Administered Medications:    0.9 %  sodium chloride infusion, , Intravenous, Continuous, Netta Cedars, MD   acetaminophen (TYLENOL) tablet 325-650 mg, 325-650 mg, Oral, Q6H PRN, Netta Cedars, MD   apixaban [facility]) tablet 2.5 mg, 2.5 mg, Oral, BID, Netta Cedars, MD, 2.5 mg at 07/13/22 4401   ascorbic acid (VITAMIN C) tablet 1,000 mg, 1,000 mg, Oral, BID, Netta Cedars, MD, 1,000 mg at 07/13/22 0272   atorvastatin (LIPITOR) tablet 20 mg, 20 mg, Oral, QHS, Netta Cedars, MD, 20 mg at 07/12/22 2100   bisacodyl (DULCOLAX) suppository 10 mg, 10 mg, Rectal, Daily PRN, Netta Cedars, MD   diclofenac Sodium (VOLTAREN) 1 % topical gel 2 g, 2 g, Topical, TID, Netta Cedars, MD, 2 g at 07/13/22 0824   docusate sodium (COLACE) capsule 100 mg, 100 mg, Oral, BID, Netta Cedars, MD, 100 mg at 07/13/22 5366   ferrous sulfate tablet 325 mg, 325 mg, Oral, TID Yolonda Kida, MD, 325 mg at 07/13/22 1311   finasteride (PROSCAR) tablet 5 mg, 5 mg, Oral, Daily, Netta Cedars, MD, 5 mg at 07/13/22 4403   HYDROcodone-acetaminophen (NORCO/VICODIN) 5-325 MG per tablet 1-2 tablet, 1-2 tablet, Oral, Q4H PRN, Netta Cedars, MD, 2 tablet at 07/12/22 2040   lisinopril (  ZESTRIL) tablet 10 mg, 10 mg, Oral, QPM, Netta Cedars, MD, 10 mg at 07/12/22 2040   menthol-cetylpyridinium (CEPACOL) lozenge 3 mg, 1 lozenge, Oral, PRN **OR** phenol (CHLORASEPTIC) mouth spray 1 spray, 1 spray, Mouth/Throat, PRN, Netta Cedars, MD   methocarbamol (ROBAXIN) tablet 500-1,000 mg, 500-1,000 mg, Oral, Q8H PRN, Netta Cedars, MD   metoCLOPramide (REGLAN) tablet 5-10 mg, 5-10 mg, Oral, Q8H PRN **OR** metoCLOPramide (REGLAN) injection 5-10 mg, 5-10 mg, Intravenous, Q8H PRN,  Netta Cedars, MD   metoprolol tartrate (LOPRESSOR) tablet 12.5 mg, 12.5 mg, Oral, BID, Netta Cedars, MD, 12.5 mg at 07/13/22 1610   morphine (PF) 2 MG/ML injection 0.5-1 mg, 0.5-1 mg, Intravenous, Q2H PRN, Netta Cedars, MD   ondansetron [facility]) tablet 4 mg, 4 mg, Oral, Q6H PRN **OR** ondansetron (ZOFRAN) injection 4 mg, 4 mg, Intravenous, Q6H PRN, Netta Cedars, MD   polyethylene glycol (MIRALAX / GLYCOLAX) packet 17 g, 17 g, Oral, Daily PRN, Netta Cedars, MD   tamsulosin [facility]) capsule 0.4 mg, 0.4 mg, Oral, Daily, Netta Cedars, MD, 0.4 mg at 07/13/22 9604   Exam: Current vital signs: BP 96/62 (BP Location: Right Arm)   Pulse 85   Temp 97.7 F (36.5 C) (Oral)   Resp 18   Ht '6\' 2"'$  (1.88 m)   Wt 96.2 kg   SpO2 97%   BMI 27.22 kg/m  Vital signs in last 24 hours: Temp:  [97.4 F (36.3 C)-98 F (36.7 C)] 97.7 F (36.5 C) (11/14 0937) Pulse Rate:  [81-106] 85 (11/14 0937) Resp:  [14-19] 18 (11/14 0937) BP: (96-129)/(62-80) 96/62 (11/14 0937) SpO2:  [94 %-100 %] 97 % (11/14 0937) General: Awake alert in no distress HEENT: Normocephalic/atraumatic Lungs: Clear Cardiovascular: Regular rhythm Extremities: 1+ edema bilaterally Neurological exam Awake alert oriented x3 No dysarthria Aphasia Cranial nerves II to XII intact Motor examination with upper extremity strength equal bilaterally.  Lower extremity-left hip flexion is 4/5 with some limitation due to pain.  Knee extension and plantar dorsiflexion are also 4+/5 and not full strength.  Right leg is full strength 5/5. Sensation intact Coordination with no dysmetria in the upper extremities.  There is mild dysmetria on the left heel-knee-shin testing but there is a pain component that prevents him from completely flexing his leg. DTRs: 2-3+ knee jerks, 2+ ankle jerks and upgoing toes bilaterally.  He has 2+ biceps brachioradialis jerks.  No Hoffman's.   Labs I have reviewed labs in epic and the results pertinent to this  consultation are:  CBC    Component Value Date/Time   WBC 6.3 07/12/2022 1304   RBC 5.51 07/12/2022 1304   HGB 13.5 07/13/2022 0333   HCT 40.3 07/13/2022 0333   PLT 266 07/12/2022 1304   MCV 88.0 07/12/2022 1304   MCH 28.1 07/12/2022 1304   MCHC 32.0 07/12/2022 1304   RDW 15.1 07/12/2022 1304   LYMPHSABS 1.5 03/25/2022 0341   MONOABS 1.3 (H) 03/25/2022 0341   EOSABS 0.3 03/25/2022 0341   BASOSABS 0.0 03/25/2022 0341    CMP     Component Value Date/Time   NA 135 07/13/2022 0333   K 4.5 07/13/2022 0333   CL 105 07/13/2022 0333   CO2 24 07/13/2022 0333   GLUCOSE 148 (H) 07/13/2022 0333   BUN 11 07/13/2022 0333   CREATININE 0.76 07/13/2022 0333   CALCIUM 8.7 (L) 07/13/2022 0333   PROT 6.0 (L) 03/21/2022 1756   ALBUMIN 3.1 (L) 03/21/2022 1756   AST 20 03/21/2022 1756   ALT 24 03/21/2022  9417   EYCXKGY 18 03/21/2022 1756   BILITOT 0.8 03/21/2022 1756   GFRNONAA >60 07/13/2022 0333   GFRAA >60 04/19/2019 0731   Imaging I have reviewed the images obtained:  MR brain without contrast-negative for stroke Lumbar spine MRI with and without contrast done in July 2023 IMPRESSION: 1. Acute to subacute compression fracture involving the superior endplate of L2 with mild 10% height loss without bony retropulsion. 2. Additional acute to subacute burst type compression fracture involving the L3 vertebral body with up to 35% height loss without bony retropulsion. 3. Degenerative disc bulge with left foraminal disc protrusion and facet hypertrophy at L4-5, resulting in moderate left worse than right L4 foraminal stenosis. 4. Disc bulge with facet hypertrophy at L3-4 with resultant moderate right worse than left L3 foraminal stenosis.  Assessment: 71 year old past history of DVT on anticoagulation hypertension hypercholesterolemia who is currently being treated for left rotator cuff injury, was asked to see by neurology for inability to recover well after left hip surgery 4 months  ago and left leg weakness and incoordination. Review of his brain MRI does not reveal any concerning findings Review of his lumbar spine MRI from July does show multilevel lumbar spine involvement including L2-L3 levels fractures and degenerative disc disease at levels below. I suspect that the gait disturbance and incoordination predominant in the left leg-is related to lumbar spine disease but because of his prior C-spine surgeries and upgoing toes, I would recommend imaging of cervical and lumbar spine as well checking blood work for reversible causes of neuropathy which might be exacerbating his gait issues.   Recommendations: MRI of the cervical and lumbar spine without contrast Check B12, TSH. thiamine levels Check UPEP and SPEP Outpatient EMG nerve conduction study with [place] [facility] or [place] [facility]. We will follow the imaging and lab studies with you Plan was relayed to Dr. Veverly Fells   -- Amie Portland, MD Neurologist [facility] Pager: [phone]

## 2022-07-13 NOTE — Op Note (Signed)
Alex [name], Alex [name] MEDICAL RECORD NO: [id number] ACCOUNT NO: [id number] DATE OF BIRTH: 1951-01-25 FACILITY: [facility] LOCATION: [facility] PHYSICIAN: Doran Heater. Veverly Fells, MD  Operative Report   DATE OF PROCEDURE: 07/12/2022  PREOPERATIVE DIAGNOSIS:  Left shoulder rotator cuff tear arthropathy.  POSTOPERATIVE DIAGNOSES: 1.  Left shoulder rotator cuff tear arthropathy. 2. Frozen shoulder, left shoulder.  PROCEDURE PERFORMED:  Left shoulder reverse total shoulder arthroplasty using DePuy Delta Xtend prosthesis with no subscapularis repair.  ATTENDING SURGEON:  Doran Heater. Veverly Fells, MD  ASSISTANT:  Charletta Cousin [place], [place], who was scrubbed during the entire procedure and necessary for satisfactory completion of surgery.  ANESTHESIA:  General anesthesia was used plus interscalene block.  ESTIMATED BLOOD LOSS:  150 mL  FLUID REPLACEMENT:  1500 mL crystalloid.  COUNTS:  Instrument counts correct.  COMPLICATIONS:  No complications.  ANTIBIOTICS:  Perioperative antibiotics were given.  INDICATIONS:  The patient is a 71 year old male with a history of a fall about 4 months ago injuring and breaking his left hip.  The patient underwent a hip replacement surgery for his broken hip, but he also injured his shoulder and has had  progressively worsening shoulder pain and dysfunction since his fall.  MRI scan indicates significant rotator cuff tears partial thickness including the upper subscap, part of the supraspinatus and infraspinatus.  In the office the patient has profound  shoulder dysfunction with loss of ability to elevate his arm away from his body.  The patient also has weakness with external rotation against resistance.  He has decent abduction strength against resistance.  We discussed options including surgical  repair of his rotator cuff versus reverse shoulder replacement.  We felt based on the patient's need for his upper extremities for load bearing and immediate use for ADLs given his still  debilitated state that reverse shoulder replacement would provide  the best and most reliable result for his shoulder, both in terms of pain relief and also return of function.  We will let him weightbear fairly quickly after his surgery.  Informed consent obtained.  Risks including but not limited to infection and  dislocation discussed with the patient and family.  DESCRIPTION OF PROCEDURE:  After an adequate level of anesthesia was achieved, the patient was positioned in modified beach chair position.  Left shoulder correctly identified and sterilely prepped and draped in the usual manner.  Timeout called,  verifying correct patient, correct site. We entered the patient's shoulder using standard deltopectoral approach, starting at the coracoid process extending down to the anterior humerus.  Dissection down through subcutaneous tissues using Bovie.  We  identified the cephalic vein and took that laterally with the deltoid, pectoralis taken medially.  Conjoined tendon identified and retracted medially.  We placed our deep retractors.  We identified the biceps tendon and tenodesed the biceps in situ with  0 Vicryl figure-of-eight suture x2 incorporating part of the pec tendon.  We then released the subscapularis subperiosteally off the lesser tuberosity.  This was extremely thinned and felt not repairable.  It was quite contracted and stiff as well.  We  then released the inferior capsule progressively, externally rotating the shoulder and delivering the humeral head out of the wound.  The patient did have significant cartilage loss off the humeral head and some osteophyte formation.  We entered the  proximal humerus with a 6 mm reamer, reaming up to a size 12.  We then placed our 12 mm T handle guide and then resected the head at 20 degrees of  retroversion for this left shoulder with the oscillating saw.  We then removed excess osteophytes with a  rongeur.  We subluxed the humerus posteriorly, gaining  good exposure of the glenoid face, removing the capsule, labrum, and biceps. We removed the cartilage that was remaining of the glenoid face, which was a fair amount.  We then found our center point  for a guide pin and placed our guide pin for our metaglene baseplate reaming.  We then reamed for the metaglene baseplate to subchondral bone.  We placed our baseplate and impacted that position, placed a 36 screw inferiorly, a 30 screw superiorly,  locking both those and a 24 anteriorly and locked that and an 18 nonlocked posteriorly.  We had good screw purchase despite the bone being very soft throughout the case.  We then went ahead and selected a 42+0 standard glenosphere and attached that to  the baseplate in the usual manner with a screwdriver.  I did a finger sweep to make sure we had no soft tissue caught up between the baseplate and the glenosphere.  I then went to the humeral side.  We then reamed for the 2 left metaphysis and we  trialled with the 12 stem and the 2 left metaphysis and then placed that in 20 degrees of retroversion.  We used a 42+3 poly trial placed on the humeral tray and reduced the shoulder.  We were pleased with our soft tissue balancing, appropriate tension  on the conjoined and no gapping with external rotation or inferior pull.  I then retrieved the trial components from the humeral side.  We irrigated thoroughly and then used HA coated press-fit 12 stem, HA coated press-fit 2 left metaphysis set on the 0  setting.  We used available bone graft from the humeral head with impaction grafting technique and impacted the stem into place.  We had nice stable stem.  We selected the real 42+3 poly and placed it on the humeral tray and impacted that. We then  reduced the shoulder, had nice little pop as it reduced and then we ranged the shoulder, no impingement was noted.  No gapping with inferior pull or external rotation. Conjoined appropriately tight.  We irrigated thoroughly. Resected  the subscap remnant  and then repaired deltopectoral interval with 0 Vicryl suture followed by 2-0 Vicryl for subcutaneous closure and 4-0 Monocryl for skin.  Steri-Strips applied followed by sterile dressing.  The patient tolerated surgery well.   VAI D: 07/12/2022 5:14:23 pm T: 07/13/2022 12:44:00 am  JOB: [id number]/ [id number]

## 2022-07-13 NOTE — Evaluation (Signed)
Physical Therapy Evaluation Patient Details Name: Alex [name] MRN: [id number] DOB: 05/25/1951 Today's Date: 07/13/2022  History of Present Illness  Pt is a 71 y/o male who presented for L reverse shoulder arthroplasty on 11/13 in setting of L rotator cuff tear. PMH: HTN, DVT, ACDF (2019), THA (02/2022)  Clinical Impression  Pt from SNF since hip sx in July but now adamant that he will return home. States that his daughter has found him one level living arrangements and his grandson will come live with him, though he works so pt will be home alone at times. On MMT pt with 4/5 strength LLE but LE's continue to fatigue quickly with gait, question if this could related more to lumbar issues than hip sx. Pt with noted deficits in coordination and proprioception. Was able to ambulate in room with hemiwalker and min A. He was more stable with HW than with cane this AM but still more needs more work to reach a level of going home with intermittent supervision. Recommend HHPT and additional caregiving if financially possible for family. PT will continue to follow.        Recommendations for follow up therapy are one component of a multi-disciplinary discharge planning process, led by the attending physician.  Recommendations may be updated based on patient status, additional functional criteria and insurance authorization.  Follow Up Recommendations Home health PT (pt appropriate for SNF but wants to go home as he has been in SNF since July)      Assistance Recommended at Discharge Frequent or constant Supervision/Assistance  Patient can return home with the following  A little help with walking and/or transfers;A little help with bathing/dressing/bathroom;Assistance with cooking/housework;Assist for transportation;Help with stairs or ramp for entrance    Equipment Recommendations Other (comment) (R hemiwalker)  Recommendations for Other Services       Functional Status Assessment Patient has had a  recent decline in their functional status and demonstrates the ability to make significant improvements in function in a reasonable and predictable amount of time.     Precautions / Restrictions Precautions Precautions: Fall;Shoulder Shoulder Interventions: Shoulder sling/immobilizer;Off for dressing/bathing/exercises Precaution Booklet Issued: Yes (comment) Precaution Comments: no A/PROM of L shoulder, AROM of hand/wrist/elbow ok Restrictions Weight Bearing Restrictions: Yes LUE Weight Bearing: Non weight bearing      Mobility  Bed Mobility Overal bed mobility: Needs Assistance Bed Mobility: Supine to Sit     Supine to sit: Supervision, HOB elevated     General bed mobility comments: increased time/effort, use of bedrail    Transfers Overall transfer level: Needs assistance Equipment used: Hemi-walker Transfers: Sit to/from Stand Sit to Stand: Min assist           General transfer comment: stood to [facility] with min A and min A to gain balance in standing    Ambulation/Gait Ambulation/Gait assistance: Min Web designer (Feet): 25 Feet Assistive device: Hemi-walker Gait Pattern/deviations: Step-through pattern Gait velocity: decreased Gait velocity interpretation: <1.31 ft/sec, indicative of household ambulator   General Gait Details: pt at min/ min guard level ambulating with HW, this was the first time using this device and expect improvement. He felt better with it than SPC this am. Not to supervision level yet though  Stairs            Wheelchair Mobility    Modified Rankin (Stroke Patients Only)       Balance Overall balance assessment: Needs assistance, History of Falls Sitting-balance support: No upper extremity supported, Feet supported  Sitting balance-Leahy Scale: Fair     Standing balance support: Single extremity supported, During functional activity Standing balance-Leahy Scale: Poor Standing balance comment: reliant on UE support                              Pertinent Vitals/Pain Pain Assessment Pain Assessment: No/denies pain    Home Living Family/patient expects to be discharged to:: Private residence Living Arrangements: Alone Available Help at Discharge: Family;Other (Comment) (75% of the time) Type of Home: Apartment Home Access: Stairs to enter   Entrance Stairs-Number of Steps: 1-2   Home Layout: One level Home Equipment: Conservation officer, nature (2 wheels);Wheelchair - manual;BSC/3in1 Additional Comments: pt reports that his daughter has found a one level apt for him and his grandson is going to come stay with him, though he will be working in the day. He reports he is not agreeable to going back to SNF but will go home    Prior Function Prior Level of Function : Needs assist             Mobility Comments: was independent and working before hip sx but since then he has needed chair follow with ambulation and was walking 200+ feet with RW ADLs Comments: Light assist for ADLs at SNF rehab though previously independent     Hand Dominance   Dominant Hand: Right    Extremity/Trunk Assessment   Upper Extremity Assessment Upper Extremity Assessment: Defer to OT evaluation    Lower Extremity Assessment Lower Extremity Assessment: LLE deficits/detail LLE Deficits / Details: hip flex 4/5, knee ext 4/5, knee flex 4/5 but pt fatigues more quickly than before his hip sx and has noted proprioception deficits. He has a h/o compression fxs in lumbar spine, question whether this is affecting his LE fatigue more than hip sx LLE Sensation: decreased proprioception LLE Coordination: decreased gross motor    Cervical / Trunk Assessment Cervical / Trunk Assessment: Kyphotic  Communication   Communication: HOH  Cognition Arousal/Alertness: Awake/alert Behavior During Therapy: WFL for tasks assessed/performed Overall Cognitive Status: Within Functional Limits for tasks assessed                                           General Comments General comments (skin integrity, edema, etc.): VSS on RA    Exercises     Assessment/Plan    PT Assessment Patient needs continued PT services  PT Problem List Decreased balance;Decreased mobility;Decreased coordination;Decreased activity tolerance;Decreased knowledge of use of DME;Decreased knowledge of precautions       PT Treatment Interventions DME instruction;Gait training;Stair training;Functional mobility training;Therapeutic activities;Therapeutic exercise;Balance training;Patient/family education;Neuromuscular re-education    PT Goals (Current goals can be found in the Care Plan section)  Acute Rehab PT Goals Patient Stated Goal: return home PT Goal Formulation: With patient Time For Goal Achievement: 07/27/22 Potential to Achieve Goals: Good    Frequency Min 3X/week     Co-evaluation               AM-PAC PT "6 Clicks" Mobility  Outcome Measure Help needed turning from your back to your side while in a flat bed without using bedrails?: A Little Help needed moving from lying on your back to sitting on the side of a flat bed without using bedrails?: A Little Help needed moving to and from a bed to a  chair (including a wheelchair)?: A Little Help needed standing up from a chair using your arms (e.g., wheelchair or bedside chair)?: A Little Help needed to walk in hospital room?: A Little Help needed climbing 3-5 steps with a railing? : A Lot 6 Click Score: 17    End of Session Equipment Utilized During Treatment: Gait belt Activity Tolerance: Patient tolerated treatment well Patient left: in chair;with call bell/phone within reach Nurse Communication: Mobility status PT Visit Diagnosis: Unsteadiness on feet (R26.81);Muscle weakness (generalized) (M62.81);Difficulty in walking, not elsewhere classified (R26.2)    Time: 4628-6381 PT Time Calculation (min) (ACUTE ONLY): 35 min   Charges:   PT  Evaluation $PT Eval Moderate Complexity: 1 Mod PT Treatments $Gait Training: 8-22 mins        Leighton Roach, PT  Acute Rehab Services Secure chat preferred Office [place] 07/13/2022, 4:15 PM

## 2022-07-14 DIAGNOSIS — M5020 Other cervical disc displacement, unspecified cervical region: Secondary | ICD-10-CM | POA: Diagnosis not present

## 2022-07-14 DIAGNOSIS — S72002D Fracture of unspecified part of neck of left femur, subsequent encounter for closed fracture with routine healing: Secondary | ICD-10-CM | POA: Diagnosis not present

## 2022-07-14 DIAGNOSIS — L03116 Cellulitis of left lower limb: Secondary | ICD-10-CM | POA: Diagnosis not present

## 2022-07-14 DIAGNOSIS — R262 Difficulty in walking, not elsewhere classified: Secondary | ICD-10-CM | POA: Diagnosis not present

## 2022-07-14 DIAGNOSIS — Z96612 Presence of left artificial shoulder joint: Secondary | ICD-10-CM

## 2022-07-14 MED ORDER — VITAMIN B-12 100 MCG PO TABS
100.0000 ug | ORAL_TABLET | Freq: Every day | ORAL | Status: DC
  Administered 2022-07-14 – 2022-07-16 (×3): 100 ug via ORAL
  Filled 2022-07-14 (×3): qty 1

## 2022-07-14 MED ORDER — CYANOCOBALAMIN 1000 MCG/ML IJ SOLN
1000.0000 ug | Freq: Once | INTRAMUSCULAR | Status: AC
  Administered 2022-07-14: 1000 ug via INTRAMUSCULAR
  Filled 2022-07-14: qty 1
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
## 2022-07-14 NOTE — TOC Initial Note (Signed)
Transition of Care [facility]) - Initial/Assessment Note    Patient Details  Name: Alex [name] MRN: [id number] Date of Birth: 1951/02/18  Transition of Care [facility]) CM/SW Contact:    Alex Chars, LCSW Phone Number: 07/14/2022, 10:28 AM  Clinical Narrative:    CSW met with pt regarding DC recommendation for [facility].  Somewhat long story, pt has been at [facility] since 03/30/22 after hip surgery, lives alone but daughter has moved him to apartment in [place] where he will be staying with his grandson moving forward.  Daughter Alex [name] lives nearby, also will be helping.  Permission given to speak with daughter.  Pt open to [facility] or SNF, asked CSW to talk with daughter.  CSW spoke with [facility].  Pt in process of discharging, could potentially return with new auth if family is wanting this.    CSW spoke with daughter Alex [name].  Pt has been in process of DC from [facility], [facility] has been set up with [facility], all prior to the current surgery.  Pt DME was supposed to be delivered, this was delayed, now is supposed to happen today.  Plan is for adult grandson to move in to the new apartment to help overnight, Misty will be one site/working from pt apartment during the day.  Discussed HH vs SNF and daughter stating she would like to move forward with DC to home with [facility].                 Expected Discharge Plan: Long Barriers to Discharge: Continued Medical Work up   Patient Goals and CMS Choice Patient states their goals for this hospitalization and ongoing recovery are:: "get home in a livable condition"      Expected Discharge Plan and Services Expected Discharge Plan: [place] In-house Referral: Clinical Social Work   Post Acute Care Choice: [place] arrangements for the past 2 months: [place]                                      Prior Living Arrangements/Services Living arrangements for the past 2  months: [place] Lives with:: Relatives Patient language and need for interpreter reviewed:: Yes Do you feel safe going back to the place where you live?: Yes      Need for Family Participation in Patient Care: Yes (Comment) Care giver support system in place?: Yes (comment) Current home services: Home PT, Home OT ([facility] pending through the SNF/[place]) Criminal Activity/Legal Involvement Pertinent to Current Situation/Hospitalization: No - Comment as needed  Activities of Daily Living Home Assistive Devices/Equipment: Eyeglasses, Wheelchair, Environmental consultant (specify type) ADL Screening (condition at time of admission) Patient's cognitive ability adequate to safely complete daily activities?: Yes Is the patient deaf or have difficulty hearing?: Yes Does the patient have difficulty seeing, even when wearing glasses/contacts?: No Does the patient have difficulty concentrating, remembering, or making decisions?: No Patient able to express need for assistance with ADLs?: Yes Does the patient have difficulty dressing or bathing?: Yes Independently performs ADLs?: Yes (appropriate for developmental age) Does the patient have difficulty walking or climbing stairs?: Yes Weakness of Legs: Both Weakness of Arms/Hands: Left  Permission Sought/Granted Permission sought to share information with : Family Supports Permission granted to share information with : Yes, Verbal Permission Granted  Share Information with NAME: daughter Alex [name]  Permission granted to  share info w AGENCY: SNF        Emotional Assessment Appearance:: Appears stated age Attitude/Demeanor/Rapport: Engaged Affect (typically observed): Appropriate, Pleasant Orientation: : Oriented to Self, Oriented to Place, Oriented to  Time, Oriented to Situation      Admission diagnosis:  H/O total shoulder replacement, left [Z96.612] Patient Active Problem List   Diagnosis Date Noted   H/O total shoulder  replacement, left 07/12/2022   Acute urinary retention 03/22/2022   S/P total left hip arthroplasty 03/22/2022   Closed left hip fracture ([place]) 03/21/2022   Hyperlipidemia 03/21/2022   HNP (herniated nucleus pulposus), cervical 11/28/2017   Personal history of DVT (deep vein thrombosis) 05/14/2017   Essential hypertension 05/14/2017   Cellulitis of left leg 05/14/2017   PCP:  Velna Hatchet, MD Pharmacy:   [facility] [place], [place] AT [facility] [place] [address] Phone: [phone] Fax: [phone]     Social Determinants of Health (SDOH) Interventions    Readmission Risk Interventions     No data to display

## 2022-07-14 NOTE — Progress Notes (Signed)
   Subjective: 2 Days Post-Op Procedure(s) (LRB): Left REVERSE SHOULDER ARTHROPLASTY (Left)  Pt c/o mild pain to left shoulder today but overall doing well Using an adjusted walker for ambulation Denies any new symptoms or issues Spoke to Neurology and C-spine and L-spine MRIs do not show any change from previous scans Pt would like to go home with home health with aid of daughter and grandson Patient reports pain as mild.  Objective:   VITALS:   Vitals:   07/14/22 0453 07/14/22 0918  BP: 98/63 133/74  Pulse: 86 96  Resp: 16 18  Temp:  97.7 F (36.5 C)  SpO2: 95% 100%    Left shoulder: incision healing well Dressing changed Nv intact distally No rashes or edema Sling in good position   LABS Recent Labs    07/12/22 1304 07/13/22 0333  HGB 15.5 13.5  HCT 48.5 40.3  WBC 6.3  --   PLT 266  --     Recent Labs    07/12/22 1304 07/13/22 0333  NA 138 135  K 4.4 4.5  BUN 12 11  CREATININE 0.68 0.76  GLUCOSE 95 148*     Assessment/Plan: 2 Days Post-Op Procedure(s) (LRB): Left REVERSE SHOULDER ARTHROPLASTY (Left) Continue to work with PT/OT Plan for d/c home once safe with transfers and ambulation Neurology recommending outpatient EMG to lower extremities to assess for any abnormality  Pain management as needed D/c planning for hopefully tomorrow after DME delivered successfully today and continue therapy Patient in agreement     Kathrynn Speed, [place] is now [facility]  [facility] [address], [place], [place], [place] [address] Phone: [phone] [email or web address] Facebook  [employer]

## 2022-07-14 NOTE — Progress Notes (Signed)
Physical Therapy Treatment Patient Details Name: Alex [name] MRN: [id number] DOB: 14-Mar-1951 Today's Date: 07/14/2022   History of Present Illness Pt is a 71 y/o male who presented for L reverse shoulder arthroplasty on 11/13 in setting of L rotator cuff tear. PMH: HTN, DVT, ACDF (2019), THA (02/2022)    PT Comments    Pt progressing towards his physical therapy goals. Pt ambulating 30 ft, 10 ft with right hemiwalker and w/c follow. Requiring min guard-minA for transfers. Propelled w/c a lap around unit pedaling with BLE's and right upper extremity. Continues to demonstrates impaired standing balance, postural abnormalities, decreased endurance, and weakness. Would benefit from follow up PT to address.     Recommendations for follow up therapy are one component of a multi-disciplinary discharge planning process, led by the attending physician.  Recommendations may be updated based on patient status, additional functional criteria and insurance authorization.  Follow Up Recommendations  Home health PT (pt appropriate for SNF but wants to go home as he has been in SNF since July)     Assistance Recommended at Discharge Frequent or constant Supervision/Assistance  Patient can return home with the following A little help with walking and/or transfers;A little help with bathing/dressing/bathroom;Assistance with cooking/housework;Assist for transportation;Help with stairs or ramp for entrance   Equipment Recommendations  Other (comment) (R hemiwalker)    Recommendations for Other Services       Precautions / Restrictions Precautions Precautions: Fall;Shoulder Shoulder Interventions: Shoulder sling/immobilizer;Off for dressing/bathing/exercises Precaution Booklet Issued: Yes (comment) Precaution Comments: no A/PROM of L shoulder, AROM of hand/wrist/elbow ok Restrictions Weight Bearing Restrictions: Yes LUE Weight Bearing: Non weight bearing     Mobility  Bed Mobility Overal bed  mobility: Needs Assistance Bed Mobility: Supine to Sit, Sit to Supine     Supine to sit: Supervision, HOB elevated Sit to supine: Min assist   General bed mobility comments: Use of bed rail. LE assist into bed    Transfers Overall transfer level: Needs assistance Equipment used: Hemi-walker Transfers: Sit to/from Stand Sit to Stand: Min guard, Min assist           General transfer comment: Min guard from elevated bed height, minA from lower surface of recliner    Ambulation/Gait Ambulation/Gait assistance: Min guard Gait Distance (Feet): 40 Feet (30", 10") Assistive device: Hemi-walker Gait Pattern/deviations: Step-through pattern, Decreased stride length, Trunk flexed, Decreased dorsiflexion - right, Decreased dorsiflexion - left, Shuffle Gait velocity: decreased     General Gait Details: Shuffling gait pattern with decreased bilateral foot clearance, stooped posture, min guard for safety with w/c follow   Theme park manager mobility: Yes Wheelchair propulsion: Right upper extremity, Both lower extermities Wheelchair parts: Needs assistance (minA for turns) Distance: `40  Modified Rankin (Stroke Patients Only)       Balance Overall balance assessment: Needs assistance, History of Falls Sitting-balance support: No upper extremity supported, Feet supported Sitting balance-Leahy Scale: Fair     Standing balance support: Single extremity supported, During functional activity Standing balance-Leahy Scale: Poor Standing balance comment: reliant on UE support                            Cognition Arousal/Alertness: Awake/alert Behavior During Therapy: WFL for tasks assessed/performed Overall Cognitive Status: Within Functional Limits for tasks assessed  Exercises      General Comments        Pertinent Vitals/Pain Pain  Assessment Pain Assessment: Faces Faces Pain Scale: Hurts a little bit Pain Location: L shoulder, back Pain Descriptors / Indicators: Discomfort, Grimacing, Guarding Pain Intervention(s): Monitored during session    Home Living                          Prior Function            PT Goals (current goals can now be found in the care plan section) Acute Rehab PT Goals Patient Stated Goal: return home PT Goal Formulation: With patient Time For Goal Achievement: 07/27/22 Potential to Achieve Goals: Good Progress towards PT goals: Progressing toward goals    Frequency    Min 3X/week      PT Plan Current plan remains appropriate    Co-evaluation              AM-PAC PT "6 Clicks" Mobility   Outcome Measure  Help needed turning from your back to your side while in a flat bed without using bedrails?: A Little Help needed moving from lying on your back to sitting on the side of a flat bed without using bedrails?: A Little Help needed moving to and from a bed to a chair (including a wheelchair)?: A Little Help needed standing up from a chair using your arms (e.g., wheelchair or bedside chair)?: A Little Help needed to walk in hospital room?: A Little Help needed climbing 3-5 steps with a railing? : A Lot 6 Click Score: 17    End of Session Equipment Utilized During Treatment: Gait belt Activity Tolerance: Patient tolerated treatment well Patient left: with call bell/phone within reach;in bed;with bed alarm set Nurse Communication: Mobility status PT Visit Diagnosis: Unsteadiness on feet (R26.81);Muscle weakness (generalized) (M62.81);Difficulty in walking, not elsewhere classified (R26.2)     Time: 1027-2536 PT Time Calculation (min) (ACUTE ONLY): 35 min  Charges:  $Therapeutic Activity: 23-37 mins                     Wyona Almas, PT, DPT Acute Rehabilitation Services Office [phone]    Deno Etienne 07/14/2022, 5:01 PM

## 2022-07-14 NOTE — Progress Notes (Signed)
Imaging of C and L spine reviewed.  No acute changes. Exam unchanged. B12 - 298 (goal from neurological standpoint is always >400) - will replete. TSH wnl Recommend outpatient follow up with [facility] or [facility] for EMG/NCS Plan d/w T. Dixon PA-c  -- Amie [place], MD Neurologist [facility] Pager: [phone]

## 2022-07-14 NOTE — Progress Notes (Addendum)
   07/14/22 1622  AMBULATORY ONLY: Non-Emergency Transportation Screening  1.  Is the Patient in the clinic and in need of immediate transportation? No  SDOH Interventions  Transportation Interventions Intervention Not Indicated;Inpatient TOC   CSW met with pt regarding transportation needs.  Pt denies having transportation issues, has daughter and grandson who can transport to medical appts.  CSW has spoken to daughter Rojelio Brenner earlier today, attempted to call again to confirm no transportation needs, left message.  Will follow up.  Lurline Idol, MSW, LCSW 11/15/20234:26 PM   1630: TC daughter Misty.  She does provide transportation to any medical appts. Lurline Idol, MSW, LCSW 11/15/20234:34 PM

## 2022-07-15 NOTE — Progress Notes (Signed)
Physical Therapy Treatment Patient Details Name: Alex [name] MRN: [id number] DOB: 1951-03-17 Today's Date: 07/15/2022   History of Present Illness Pt is a 71 y/o male who presented for L reverse shoulder arthroplasty on 11/13 in setting of L rotator cuff tear. PMH: HTN, DVT, ACDF (2019), THA (02/2022).    PT Comments    Pt received in supine, agreeable to therapy session and with good participation and tolerance for transfer, gait and stair training with hemi-walker and wheelchair follow for safety. Pt able to progress gait distance to household distances but does still need physical assist for transfers and stair ascent/descent due to deconditioning. Pt standing better from elevated surfaces and able to propel his wheelchair for RUE/BLE strengthening without assist. Pt HR to 125 bpm with exertion and SpO2 WFL on RA. Pt pulling ~1250 on IS. Pt continues to benefit from PT services to progress toward functional mobility goals.    Recommendations for follow up therapy are one component of a multi-disciplinary discharge planning process, led by the attending physician.  Recommendations may be updated based on patient status, additional functional criteria and insurance authorization.  Follow Up Recommendations  Home health PT (pt appropriate for SNF but wants to go home as he has been in SNF since July)     Assistance Recommended at Discharge Frequent or constant Supervision/Assistance  Patient can return home with the following A little help with walking and/or transfers;A little help with bathing/dressing/bathroom;Assistance with cooking/housework;Assist for transportation;Help with stairs or ramp for entrance   Equipment Recommendations  Other (comment) (R hemiwalker)    Recommendations for Other Services       Precautions / Restrictions Precautions Precautions: Fall;Shoulder Shoulder Interventions: Shoulder sling/immobilizer;Off for dressing/bathing/exercises Precaution Booklet  Issued: Yes (comment) Precaution Comments: no A/PROM of L shoulder, AROM of hand/wrist/elbow ok Restrictions Weight Bearing Restrictions: Yes LUE Weight Bearing: Non weight bearing     Mobility  Bed Mobility Overal bed mobility: Needs Assistance Bed Mobility: Supine to Sit     Supine to sit: Supervision, HOB elevated     General bed mobility comments: Use of bed rail.    Transfers Overall transfer level: Needs assistance Equipment used: Hemi-walker Transfers: Sit to/from Stand Sit to Stand: Min guard, Min assist, From elevated surface   Step pivot transfers: Min guard, From elevated surface       General transfer comment: Min guard from elevated bed height, minA from lower surface of recliner and from wheelchair heights. Posterior lean and pt using posterior LE support of bed frame when standing. Does better with cues for anterior lean and use of momentum strategy    Ambulation/Gait Ambulation/Gait assistance: Min guard, Min assist Gait Distance (Feet): 100 Feet (x2 with seated break, and 32f in PT gym) Assistive device: Hemi-walker Gait Pattern/deviations: Step-through pattern, Decreased stride length, Trunk flexed, Decreased dorsiflexion - right, Decreased dorsiflexion - left, Step-to pattern Gait velocity: decreased     General Gait Details: Decreased bilateral foot clearance, stooped posture, min guard for safety with w/c follow and postural cues, pt able to briefly stand more erect with cues; short/low steps, no overt LOB, up to minA when turning and through narrow spaces as pt fatigues/for AD assist.   Stairs Stairs: Yes Stairs assistance: Min assist, Mod assist Stair Management: One rail Right, Step to pattern, Forwards Number of Stairs: 2 General stair comments: pt ascended/descended two 7" steps with step-to gait pattern and min to modA for stability via gait belt, increased assist while pt descending   Wheelchair  Mobility Wheelchair Mobility Wheelchair  mobility: Yes Wheelchair propulsion: Right upper extremity, Both lower extermities Wheelchair parts: Needs assistance Distance: 100 Wheelchair Assistance Details (indicate cue type and reason): no cues needed, intermittent minA as pt fatigued, then totalA for remainder of distance.  Modified Rankin (Stroke Patients Only)       Balance Overall balance assessment: Needs assistance, History of Falls Sitting-balance support: No upper extremity supported, Feet supported Sitting balance-Leahy Scale: Fair     Standing balance support: Single extremity supported, During functional activity Standing balance-Leahy Scale: Poor Standing balance comment: Reliant on UE support                            Cognition Arousal/Alertness: Awake/alert Behavior During Therapy: WFL for tasks assessed/performed Overall Cognitive Status: Within Functional Limits for tasks assessed                                          Exercises Other Exercises Other Exercises: wheelchair propelling as RUE/LE TE for pt strengthening, pt did not need cues for W/C mgmt Other Exercises: IS x 10 reps ~1250 mL each    General Comments General comments (skin integrity, edema, etc.): gait belt given along with geo mat cushion for DC as pt needed external assist for stability and also c/o discomfort from prolonged sitting in hospital recliner/wheelchair, pt reports improved comfort with cushion  under him. reviewed ice pack frequency/usage      Pertinent Vitals/Pain Pain Assessment Pain Assessment: 0-10 Pain Score: 2  Faces Pain Scale: Hurts a little bit Pain Location: L shoulder, back Pain Descriptors / Indicators: Discomfort, Grimacing, Guarding Pain Intervention(s): Monitored during session, Ice applied, Limited activity within patient's tolerance (geo mat pressure relief cushion given for his chair as pt reports discomfort from prolonged sitting previous date and will have transport chair  upon DC which does not have much cushioning)           PT Goals (current goals can now be found in the care plan section) Acute Rehab PT Goals Patient Stated Goal: return home with family assist, to be able to walk household distances without difficulty on my own PT Goal Formulation: With patient Time For Goal Achievement: 07/27/22 Progress towards PT goals: Progressing toward goals    Frequency    Min 3X/week      PT Plan Current plan remains appropriate       AM-PAC PT "6 Clicks" Mobility   Outcome Measure  Help needed turning from your back to your side while in a flat bed without using bedrails?: A Little Help needed moving from lying on your back to sitting on the side of a flat bed without using bedrails?: A Little Help needed moving to and from a bed to a chair (including a wheelchair)?: A Little Help needed standing up from a chair using your arms (e.g., wheelchair or bedside chair)?: A Little Help needed to walk in hospital room?: A Little Help needed climbing 3-5 steps with a railing? : A Lot 6 Click Score: 17    End of Session Equipment Utilized During Treatment: Gait belt Activity Tolerance: Patient tolerated treatment well Patient left: with call bell/phone within reach;in chair;with chair alarm set;Other (comment) (pillow under/behind LUE for comfort and ice pack over anterior portion of shoulder) Nurse Communication: Mobility status PT Visit Diagnosis: Unsteadiness on feet (R26.81);Muscle weakness (  generalized) (M62.81);Difficulty in walking, not elsewhere classified (R26.2)     Time: 0321-2248 PT Time Calculation (min) (ACUTE ONLY): 51 min  Charges:  $Gait Training: 23-37 mins $Therapeutic Exercise: 8-22 mins                     Alex [name] P., PTA Acute Rehabilitation Services Secure Chat Preferred 9a-5:30pm Office: [place] 07/15/2022, 1:09 PM

## 2022-07-15 NOTE — Progress Notes (Signed)
Orthopedics Progress Note  Subjective: Patient feeling better with minimal pain with the left shoulder.  Doing great with therapy  Objective:  Vitals:   07/14/22 2151 07/15/22 0853  BP: 105/61 104/65  Pulse: (!) 102 72  Resp:  16  Temp: 98.2 F (36.8 C) 97.6 F (36.4 C)  SpO2: 98% 97%    General: Awake and alert  Musculoskeletal: Left shoulder wound CDI, dressing changed Neurovascularly intact  Lab Results  Component Value Date   WBC 6.3 07/12/2022   HGB 13.5 07/13/2022   HCT 40.3 07/13/2022   MCV 88.0 07/12/2022   PLT 266 07/12/2022       Component Value Date/Time   NA 135 07/13/2022 0333   K 4.5 07/13/2022 0333   CL 105 07/13/2022 0333   CO2 24 07/13/2022 0333   GLUCOSE 148 (H) 07/13/2022 0333   BUN 11 07/13/2022 0333   CREATININE 0.76 07/13/2022 0333   CALCIUM 8.7 (L) 07/13/2022 0333   GFRNONAA >60 07/13/2022 0333   GFRAA >60 04/19/2019 0731    Lab Results  Component Value Date   INR 1.1 03/21/2022   INR 1.06 11/28/2017    Assessment/Plan: POD #3 s/p Procedure(s): Left REVERSE SHOULDER ARTHROPLASTY Stable after R -TSA for RCT, OA and shoulder dysfunction.  Pain much improved. Ok for ADLs with left shoulder and limited WB Cleared by therapy with hemi walker for discharge to home.   Follow up with me in the office in two weeks   Doran Heater. Veverly Fells, MD 07/15/2022 12:48 PM

## 2022-07-15 NOTE — Progress Notes (Signed)
Mobility Specialist: Progress Note   07/15/22 1647  Mobility  Activity Ambulated with assistance in hallway  Level of Assistance Minimal assist, patient does 75% or more  Assistive Device  (Hemi-walker)  Distance Ambulated (ft) 120 ft  LUE Weight Bearing NWB  Activity Response Tolerated well  Mobility Referral Yes  $Mobility charge 1 Mobility   Pt received in the bed and agreeable to mobility. C/o L shoulder pain with bed mobility and ambulation, otherwise asymptomatic. Pt back to bed after session with call bell and phone at his side.   [place] Shalik Sanfilippo Mobility Specialist Please contact via SecureChat or Rehab office at [phone]

## 2022-07-15 NOTE — Discharge Summary (Signed)
In most cases prophylactic antibiotics for Dental procdeures after total joint surgery are not necessary.  Exceptions are as follows:  1. History of prior total joint infection  2. Severely immunocompromised (Organ Transplant, cancer chemotherapy, Rheumatoid biologic meds such as [place])  3. Poorly controlled diabetes (A1C &gt; 8.0, blood glucose over 200)  If you have one of these conditions, contact your surgeon for an antibiotic prescription, prior to your dental procedure. Orthopedic Discharge Summary        Physician Discharge Summary  Patient ID: Alex [name] MRN: [id number] DOB/AGE: 30-Jan-1951 71 y.o.  Admit date: 07/12/2022 Discharge date: 07/15/2022   Procedures:  Procedure(s) (LRB): Left REVERSE SHOULDER ARTHROPLASTY (Left)  Attending Physician:  Dr. Esmond Plants  Admission Diagnoses:   left shoulder rotator cuff tear arthropathy  Discharge Diagnoses:  same   Past Medical History:  Diagnosis Date   DVT (deep venous thrombosis) ([place]) 04/23/2016   LLE   Headache    Hypercholesterolemia    Hypertension     PCP: Velna Hatchet, MD   Discharged Condition: stable  Hospital Course:  Patient underwent the above stated procedure on 07/12/2022. Patient tolerated the procedure well and brought to the recovery room in good condition and subsequently to the floor. Patient had an uncomplicated hospital course and was stable for discharge.   Disposition: Discharge disposition: 01-Home or Self Care      with follow up in 2 weeks    Follow-up Information     Netta Cedars, MD. Call in 2 week(s).   Specialty: Orthopedic Surgery Why: call [phone] for appt in two weeks Contact information: [address] [place] [address] [phone]                 Dental Antibiotics:  In most cases prophylactic antibiotics for Dental procdeures after total joint surgery are not necessary.  Exceptions are as follows:  1.  History of prior total joint infection  2. Severely immunocompromised (Organ Transplant, cancer chemotherapy, Rheumatoid biologic meds such as [place])  3. Poorly controlled diabetes (A1C &gt; 8.0, blood glucose over 200)  If you have one of these conditions, contact your surgeon for an antibiotic prescription, prior to your dental procedure.  Discharge Instructions     Call MD / Call 911   Complete by: As directed    If you experience chest pain or shortness of breath, CALL 911 and be transported to the hospital emergency room.  If you develope a fever above 101 F, pus (white drainage) or increased drainage or redness at the wound, or calf pain, call your surgeon's office.   Constipation Prevention   Complete by: As directed    Drink plenty of fluids.  Prune juice may be helpful.  You may use a stool softener, such as Colace (over the counter) 100 mg twice a day.  Use MiraLax (over the counter) for constipation as needed.   Diet - low sodium heart healthy   Complete by: As directed    Increase activity slowly as tolerated   Complete by: As directed    Post-operative opioid taper instructions:   Complete by: As directed    POST-OPERATIVE OPIOID TAPER INSTRUCTIONS: It is important to wean off of your opioid medication as soon as possible. If you do not need pain medication after your surgery it is ok to stop day one. Opioids include: Codeine, Hydrocodone(Norco, Vicodin), Oxycodone(Percocet, oxycontin) and hydromorphone amongst others.  Long term and even short term use of opiods can  cause: Increased pain response Dependence Constipation Depression Respiratory depression And more.  Withdrawal symptoms can include Flu like symptoms Nausea, vomiting And more Techniques to manage these symptoms Hydrate well Eat regular healthy meals Stay active Use relaxation techniques(deep breathing, meditating, yoga) Do Not substitute Alcohol to help with tapering If you have been on opioids  for less than two weeks and do not have pain than it is ok to stop all together.  Plan to wean off of opioids This plan should start within one week post op of your joint replacement. Maintain the same interval or time between taking each dose and first decrease the dose.  Cut the total daily intake of opioids by one tablet each day Next start to increase the time between doses. The last dose that should be eliminated is the evening dose.          Allergies as of 07/15/2022       Reactions   Vancomycin Hives        Medication List     STOP taking these medications    celecoxib 100 MG capsule Commonly known as: CELEBREX       TAKE these medications    acetaminophen 325 MG tablet Commonly known as: TYLENOL Take 1-2 tablets (325-650 mg total) by mouth every 6 (six) hours as needed for mild pain (pain score 1-3 or temp > 100.5).   ascorbic acid 500 MG tablet Commonly known as: VITAMIN C Take 1,000 mg by mouth 2 (two) times daily.   atorvastatin 20 MG tablet Commonly known as: LIPITOR Take 20 mg by mouth at bedtime.   cyclobenzaprine 10 MG tablet Commonly known as: FLEXERIL Take 10 mg by mouth at bedtime.   diclofenac Sodium 1 % Gel Commonly known as: VOLTAREN Apply 2 g topically 3 (three) times daily. Apply to left shoulder   docusate sodium 100 MG capsule Commonly known as: COLACE Take 1 capsule (100 mg total) by mouth 2 (two) times daily.   Eliquis 2.5 MG Tabs tablet Generic drug: apixaban Take 2.5 mg by mouth 2 (two) times daily.   ferrous sulfate 325 (65 FE) MG tablet Take 1 tablet (325 mg total) by mouth 3 (three) times daily after meals.   finasteride 5 MG tablet Commonly known as: PROSCAR Take 5 mg by mouth daily.   HYDROcodone-acetaminophen 5-325 MG tablet Commonly known as: NORCO/VICODIN Take 1 tablet by mouth every 6 (six) hours as needed for moderate pain or severe pain (pain). What changed:  how much to take reasons to take this    lisinopril 10 MG tablet Commonly known as: ZESTRIL Take 10 mg by mouth every evening.   methocarbamol 500 MG tablet Commonly known as: ROBAXIN Take 500-1,000 mg by mouth every 8 (eight) hours as needed for muscle spasms (Depending on Severity).   metoprolol tartrate 25 MG tablet Commonly known as: LOPRESSOR Take 0.5 tablets (12.5 mg total) by mouth 2 (two) times daily.   polyethylene glycol 17 g packet Commonly known as: MIRALAX / GLYCOLAX Take 17 g by mouth daily as needed for mild constipation.   tamsulosin 0.4 MG Caps capsule Commonly known as: FLOMAX Take 0.4 mg by mouth daily.          Signed: Augustin Schooling 07/15/2022, 12:53 PM  [facility] is now [facility] [address] [place], [place], [place] [address] Phone: [place]

## 2022-07-15 NOTE — Consult Note (Signed)
   [facility] Inpatient Consult   07/15/2022  DUY LEMMING 1951/04/25 [id number]  [place] [facility] [ACO] Patient:  Alex [name] [facility] FA  Primary Care Provider:  Velna Hatchet, MD with [facility]  Patient screened for hospitalization to assess for potential [place] Management service needs for post hospital transition for care coordination.  Review of patient's electronic medical record reveals patient is to transition home with [facility] and recommended to return to skilled nursing facility due to recommendations for 24 hour care needs for level of activity, note to live alone and SDOH reveals red  Attempts to contact  via cell phone listed patient was currently unsuccessful to address community follow up needs.   Plan:    Referral request for community care coordination: for community follow up transition  Of note, [place] does not replace or interfere with any arrangements made by the Inpatient Transition of Care team.  For questions contact:   Natividad Brood, RN BSN [place]  [phone] business mobile phone Toll free office [phone]  *[place]  [phone] Fax number: [phone] [name]

## 2022-07-15 NOTE — Plan of Care (Signed)

## 2022-07-15 NOTE — TOC Progression Note (Signed)
Transition of Care [facility]) - Progression Note    Patient Details  Name: Alex [name] MRN: [id number] Date of Birth: [birth year]  Transition of Care [facility]) CM/SW Contact  Sharin Mons, RN Phone Number: 07/15/2022, 4:47 PM  Clinical Narrative:     L reverse shoulder arthroplasty on 11/13   Pt without DME needs. Resumption of home health services with [facility] in placed. NCM spoke pt and pt daughter's regarding d/c plan. Daughter stated hoping pt can d/c on tomorrow, that's when she will be available to help assist with his care ,work conflict.  TOC will continue to monitor and assist with needs....   Expected Discharge Plan: [place] Barriers to Discharge: Continued Medical Work up  Expected Discharge Plan and Services Expected Discharge Plan: [place] In-house Referral: Clinical Social Work Discharge Planning Services: CM Consult Post Acute Care Choice: [place] arrangements for the past 2 months: [place] Expected Discharge Date: 07/15/22                           [facility] Agency: [place] ([place]) Date [place]: 07/15/22 Time [place]: 3903 Representative spoke with at [place]: [place] ([place]) Interventions Transportation Interventions: Intervention Not Indicated, Inpatient TOC  Readmission Risk Interventions     No data to display

## 2022-07-16 LAB — PROTEIN ELECTROPHORESIS, SERUM
A/G Ratio: 1.2 (ref 0.7–1.7)
Albumin ELP: 3.2 g/dL (ref 2.9–4.4)
Alpha-1-Globulin: 0.3 g/dL (ref 0.0–0.4)
Alpha-2-Globulin: 0.7 g/dL (ref 0.4–1.0)
Beta Globulin: 0.8 g/dL (ref 0.7–1.3)
Gamma Globulin: 0.8 g/dL (ref 0.4–1.8)
Globulin, Total: 2.6 g/dL (ref 2.2–3.9)
Total Protein ELP: 5.8 g/dL — ABNORMAL LOW (ref 6.0–8.5)

## 2022-07-16 NOTE — Progress Notes (Signed)
Patient's daughter arrived to unit after 6pm- patient  left unit with daughter at this time. Escorted patient to car with daughter via wheelchair.

## 2022-07-16 NOTE — Progress Notes (Signed)
Physical Therapy Treatment Patient Details Name: Alex [name] MRN: [id number] DOB: Oct 19, 1950 Today's Date: 07/16/2022   History of Present Illness Pt is a 71 y/o male who presented for L reverse shoulder arthroplasty on 11/13 in setting of L rotator cuff tear. PMH: HTN, DVT, ACDF (2019), THA (02/2022).    PT Comments    Pt received in supine, agreeable to therapy session with encouragement and with good participation and tolerance for transfer and gait training using hemi-walker. Pt new HW adjusted for proper height and pt able to perform household distance gait trail with min guard and simulated curb step-up with min guard using HW; pt needing heavy minA for sit>stand from bed height to HW due to decreased anterior weight shift with standing. Pt continues to benefit from PT services to progress toward functional mobility goals.    Recommendations for follow up therapy are one component of a multi-disciplinary discharge planning process, led by the attending physician.  Recommendations may be updated based on patient status, additional functional criteria and insurance authorization.  Follow Up Recommendations  Home health PT (pt appropriate for SNF but wants to go home as he has been in SNF since July)     Assistance Recommended at Discharge Frequent or constant Supervision/Assistance  Patient can return home with the following A little help with walking and/or transfers;A little help with bathing/dressing/bathroom;Assistance with cooking/housework;Assist for transportation;Help with stairs or ramp for entrance   Equipment Recommendations   (HW already delivered to the room)    Recommendations for Other Services       Precautions / Restrictions Precautions Precautions: Fall;Shoulder Shoulder Interventions: Shoulder sling/immobilizer;Off for dressing/bathing/exercises Precaution Comments: no A/PROM of L shoulder, AROM of hand/wrist/elbow ok Required Braces or Orthoses:  Sling Restrictions Weight Bearing Restrictions: Yes LUE Weight Bearing: Non weight bearing     Mobility  Bed Mobility Overal bed mobility: Needs Assistance Bed Mobility: Supine to Sit     Supine to sit: Supervision, HOB elevated     General bed mobility comments: Use of bed rail.    Transfers Overall transfer level: Needs assistance Equipment used: Hemi-walker Transfers: Sit to/from Stand, Bed to chair/wheelchair/BSC Sit to Stand: Min assist   Step pivot transfers: Min guard, From elevated surface       General transfer comment: minA from lower bed height, pt unable to stand on first attempt, after cues for anterior lean and pt use of momentum strategy, he was able to stand fully upright; needed gait belt lift assist; min guard to sit in chair with pillows    Ambulation/Gait Ambulation/Gait assistance: Min guard Gait Distance (Feet): 100 Feet Assistive device: Hemi-walker Gait Pattern/deviations: Step-through pattern, Decreased stride length, Trunk flexed, Decreased dorsiflexion - right, Decreased dorsiflexion - left, Step-to pattern       General Gait Details: Decreased bilateral foot clearance, stooped posture, min guard for safety with frequent postural cues, pt able to briefly stand more erect with cues, no chair follow this date.   Stairs   Stairs assistance: Min guard Stair Management: With walker, Forwards (HW)   General stair comments: pt tapping each foot up onto 3" platform to simulate curb step using hemiwalker, no LOB; min guard for safety   Wheelchair Mobility    Modified Rankin (Stroke Patients Only)       Balance Overall balance assessment: Needs assistance, History of Falls Sitting-balance support: No upper extremity supported, Feet supported Sitting balance-Leahy Scale: Fair     Standing balance support: Single extremity supported, During functional activity  Standing balance-Leahy Scale: Poor Standing balance comment: Reliant on UE  support                            Cognition Arousal/Alertness: Awake/alert Behavior During Therapy: WFL for tasks assessed/performed Overall Cognitive Status: Within Functional Limits for tasks assessed                                          Exercises Other Exercises Other Exercises: IS x10 1300-1700 mL    General Comments        Pertinent Vitals/Pain Pain Assessment Pain Assessment: Faces Faces Pain Scale: Hurts a little bit Pain Location: L shoulder, back Pain Descriptors / Indicators: Discomfort, Grimacing Pain Intervention(s): Monitored during session, Repositioned           PT Goals (current goals can now be found in the care plan section) Acute Rehab PT Goals Patient Stated Goal: return home with family assist, to be able to walk household distances without difficulty on my own PT Goal Formulation: With patient Time For Goal Achievement: 07/27/22 Progress towards PT goals: Progressing toward goals    Frequency    Min 3X/week      PT Plan Current plan remains appropriate       AM-PAC PT "6 Clicks" Mobility   Outcome Measure  Help needed turning from your back to your side while in a flat bed without using bedrails?: A Little Help needed moving from lying on your back to sitting on the side of a flat bed without using bedrails?: A Little Help needed moving to and from a bed to a chair (including a wheelchair)?: A Little Help needed standing up from a chair using your arms (e.g., wheelchair or bedside chair)?: A Little Help needed to walk in hospital room?: A Little Help needed climbing 3-5 steps with a railing? : A Lot 6 Click Score: 17    End of Session Equipment Utilized During Treatment: Gait belt Activity Tolerance: Patient tolerated treatment well Patient left: in chair;with call bell/phone within reach;with chair alarm set Nurse Communication: Mobility status PT Visit Diagnosis: Unsteadiness on feet  (R26.81);Muscle weakness (generalized) (M62.81);Difficulty in walking, not elsewhere classified (R26.2)     Time: 8757-9728 PT Time Calculation (min) (ACUTE ONLY): 18 min  Charges:  $Gait Training: 8-22 mins                     Shatasia Cutshaw P., PTA Acute Rehabilitation Services Secure Chat Preferred 9a-5:30pm Office: [place] 07/16/2022, 4:52 PM

## 2022-07-16 NOTE — Progress Notes (Signed)
Kathleene Hazel RN spoke with patient's daughter. She isn't able to pick the patient up until 5pm. Rip Harbour is going over the discharge summary with the patient so that he will be ready for discharge when his daughter arrive at 5pm. He is not discharge lounge appropriate due to fall risk.   Levora Dredge RN

## 2022-07-16 NOTE — Progress Notes (Signed)
   Subjective: 4 Days Post-Op Procedure(s) (LRB): Left REVERSE SHOULDER ARTHROPLASTY (Left)  C/o mild pain in the left shoulder with ambulation  Plan for d/c today with daughter Denies any new symptoms or issues Patient reports pain as mild.  Objective:   VITALS:   Vitals:   07/16/22 0604 07/16/22 0800  BP: 105/62 99/63  Pulse: 76 74  Resp:  16  Temp: 98.1 F (36.7 C) 98.8 F (37.1 C)  SpO2:  97%    Left shoulder: dressing in place Well healing incision Nv intact distally No rashes or edema distally  LABS No results for input(s): "HGB", "HCT", "WBC", "PLT" in the last 72 hours.  No results for input(s): "NA", "K", "BUN", "CREATININE", "GLUCOSE" in the last 72 hours.   Assessment/Plan: 4 Days Post-Op Procedure(s) (LRB): Left REVERSE SHOULDER ARTHROPLASTY (Left) Plan to d/c home later today with daughter F/u in the office in 2 weeks Pain management Continue PT/OT    Merla Riches PA-C, [place] is now [facility]  [facility] [address], [place], [place], [place] [address] Phone: [phone] [email or web address] Facebook  [employer]

## 2022-07-16 NOTE — Progress Notes (Signed)
Occupational Therapy Treatment Patient Details Name: Alex [name] MRN: [id number] DOB: [birth year] Today's Date: 07/16/2022   History of present illness Pt is a 71 y/o male who presented for L reverse shoulder arthroplasty on 11/13 in setting of L rotator cuff tear. PMH: HTN, DVT, ACDF (2019), THA (02/2022).   OT comments  Pt. Seen for skilled OT treatment.  Able to don/doff sling with good technique.  Good recall of ROM LUE for digits/wrist/elbow.  Reports he has A/E for LB ADLs at home.  Eager for d/c home likely later today.   Recommendations for follow up therapy are one component of a multi-disciplinary discharge planning process, led by the attending physician.  Recommendations may be updated based on patient status, additional functional criteria and insurance authorization.    Follow Up Recommendations  Skilled nursing-short term rehab (<3 hours/day)     Assistance Recommended at Discharge Frequent or constant Supervision/Assistance  Patient can return home with the following  A lot of help with walking and/or transfers;A lot of help with bathing/dressing/bathroom   Equipment Recommendations  Other (comment)    Recommendations for Other Services      Precautions / Restrictions Precautions Precautions: Fall;Shoulder Shoulder Interventions: Shoulder sling/immobilizer;Off for dressing/bathing/exercises Precaution Comments: no A/PROM of L shoulder, AROM of hand/wrist/elbow ok Restrictions LUE Weight Bearing: Non weight bearing       Mobility Bed Mobility Overal bed mobility: Needs Assistance Bed Mobility: Supine to Sit     Supine to sit: Supervision, HOB elevated          Transfers Overall transfer level: Needs assistance Equipment used: Hemi-walker Transfers: Sit to/from Stand, Bed to chair/wheelchair/BSC Sit to Stand: Min guard, Min assist, From elevated surface Stand pivot transfers: Min assist   Step pivot transfers: Min guard, From elevated surface            Balance                                           ADL either performed or assessed with clinical judgement   ADL Overall ADL's : Needs assistance/impaired                 Upper Body Dressing : Set up;Cueing for sequencing;Cueing for compensatory techniques;Sitting Upper Body Dressing Details (indicate cue type and reason): able to don and doff sling with cues   Lower Body Dressing Details (indicate cue type and reason): attempted seated eob pt. unable and statres he uses a reacher at home without difficulty Toilet Transfer: Minimal assistance;Stand-pivot;Squat-pivot Toilet Transfer Details (indicate cue type and reason): simulated in room with transfer from eob to recliner         Functional mobility during ADLs: Min guard;Cueing for sequencing;Cueing for safety (hemi walker)      Extremity/Trunk Assessment              Vision       Perception     Praxis      Cognition Arousal/Alertness: Awake/alert Behavior During Therapy: WFL for tasks assessed/performed Overall Cognitive Status: Within Functional Limits for tasks assessed                                          Exercises      Shoulder Instructions  General Comments      Pertinent Vitals/ Pain       Pain Assessment Pain Assessment: No/denies pain  Home Living                                          Prior Functioning/Environment              Frequency  Min 2X/week        Progress Toward Goals  OT Goals(current goals can now be found in the care plan section)  Progress towards OT goals: Progressing toward goals     Plan Discharge plan remains appropriate    Co-evaluation                 AM-PAC OT "6 Clicks" Daily Activity     Outcome Measure   Help from another person eating meals?: A Little Help from another person taking care of personal grooming?: A Little Help from another person toileting,  which includes using toliet, bedpan, or urinal?: A Lot Help from another person bathing (including washing, rinsing, drying)?: A Lot Help from another person to put on and taking off regular upper body clothing?: A Lot Help from another person to put on and taking off regular lower body clothing?: A Lot 6 Click Score: 14    End of Session Equipment Utilized During Treatment: Other (comment) (hemi walker)  OT Visit Diagnosis: Other abnormalities of gait and mobility (R26.89);Unsteadiness on feet (R26.81)   Activity Tolerance Patient tolerated treatment well   Patient Left in chair;with call bell/phone within reach   Nurse Communication Other (comment) (alerted CNA pt. requesting a wash up)        Time: 0840-0900 OT Time Calculation (min): 20 min  Charges: OT General Charges $OT Visit: 1 Visit OT Treatments $Therapeutic Activity: 8-22 mins  Sonia Baller, COTA/L Acute Rehabilitation [phone]   Clearnce Sorrel Lorraine-COTA/L 07/16/2022, 9:04 AM

## 2022-07-17 LAB — VITAMIN B1: Vitamin B1 (Thiamine): 98.8 nmol/L (ref 66.5–200.0)

## 2022-07-20 ENCOUNTER — Telehealth: Payer: Self-pay | Admitting: *Deleted

## 2022-07-20 NOTE — Progress Notes (Signed)
  Care Coordination   Note   07/20/2022 Name: Alex [name] MRN: [id number] DOB: [birth year]  Alex [name] is a 71 y.o. year old male who sees Velna Hatchet, MD for primary care. I reached out to Ivor Reining by phone today to offer care coordination services.  Mr. Asher was given information about Care Coordination services today including:   The Care Coordination services include support from the care team which includes your Nurse Coordinator, Clinical Social Worker, or Pharmacist.  The Care Coordination team is here to help remove barriers to the health concerns and goals most important to you. Care Coordination services are voluntary, and the patient may decline or stop services at any time by request to their care team member.   Care Coordination Consent Status: Patient agreed to services and verbal consent obtained.   Follow up plan:  Telephone appointment with care coordination team member scheduled for:  08/02/22  Encounter Outcome:  Pt. Scheduled   [place]  Direct Dial: [phone]

## 2022-07-28 DIAGNOSIS — R296 Repeated falls: Secondary | ICD-10-CM | POA: Diagnosis not present

## 2022-07-28 DIAGNOSIS — I10 Essential (primary) hypertension: Secondary | ICD-10-CM | POA: Diagnosis not present

## 2022-07-29 DIAGNOSIS — Z4789 Encounter for other orthopedic aftercare: Secondary | ICD-10-CM | POA: Diagnosis not present

## 2022-08-02 ENCOUNTER — Ambulatory Visit: Payer: Self-pay

## 2022-08-02 NOTE — Patient Outreach (Signed)
  Care Coordination   08/02/2022 Name: Alex [name] MRN: [id number] DOB: 05-22-1951   Care Coordination Outreach Attempts:  An unsuccessful telephone outreach was attempted for a scheduled appointment today.  Follow Up Plan:  Additional outreach attempts will be made to offer the patient care coordination information and services.   Encounter Outcome:  No Answer   Care Coordination Interventions:  No, not indicated    Barb Merino, RN, BSN, CCM Care Management Coordinator [facility] Care Management Direct Phone: [phone]

## 2022-08-06 ENCOUNTER — Ambulatory Visit (INDEPENDENT_AMBULATORY_CARE_PROVIDER_SITE_OTHER): Payer: BC Managed Care – PPO | Admitting: Podiatry

## 2022-08-06 ENCOUNTER — Telehealth: Payer: Self-pay | Admitting: *Deleted

## 2022-08-06 DIAGNOSIS — B351 Tinea unguium: Secondary | ICD-10-CM

## 2022-08-06 DIAGNOSIS — M79675 Pain in left toe(s): Secondary | ICD-10-CM | POA: Diagnosis not present

## 2022-08-06 DIAGNOSIS — M79674 Pain in right toe(s): Secondary | ICD-10-CM

## 2022-08-06 NOTE — Progress Notes (Signed)
  Care Coordination Note  08/06/2022 Name: [name] MRN: [id number] DOB: [birth year]  [name] is a 71 y.o. year old male who is a primary care patient of Velna Hatchet, MD and is actively engaged with the care management team. I reached out to Ivor Reining by phone today to assist with re-scheduling an initial visit with the RN Case Manager  Follow up plan: Unsuccessful telephone outreach attempt made. A HIPAA compliant phone message was left for the patient providing contact information and requesting a return call.   [place]  Direct Dial: [phone]

## 2022-08-06 NOTE — Progress Notes (Signed)
   Chief Complaint  Patient presents with   foot care    Patient is here for bilateral foot care.    SUBJECTIVE Patient presents to office today complaining of elongated, thickened nails that cause pain while ambulating in shoes.  Patient is unable to trim their own nails. Patient is here for further evaluation and treatment.  Past Medical History:  Diagnosis Date   DVT (deep venous thrombosis) ([place]) 04/23/2016   LLE   Headache    Hypercholesterolemia    Hypertension     Allergies  Allergen Reactions   Vancomycin Hives     OBJECTIVE General Patient is awake, alert, and oriented x 3 and in no acute distress. Derm Skin is dry and supple bilateral. Negative open lesions or macerations. Remaining integument unremarkable. Nails are tender, long, thickened and dystrophic with subungual debris, consistent with onychomycosis, 1-5 bilateral. No signs of infection noted. Vasc  DP and PT pedal pulses palpable bilaterally. Temperature gradient within normal limits.  Neuro Epicritic and protective threshold sensation grossly intact bilaterally.  Musculoskeletal Exam No symptomatic pedal deformities noted bilateral. Muscular strength within normal limits.  ASSESSMENT 1.  Pain due to onychomycosis of toenails both  PLAN OF CARE 1. Patient evaluated today.  2. Instructed to maintain good pedal hygiene and foot care.  3. Mechanical debridement of nails 1-5 bilaterally performed using a nail nipper. Filed with dremel without incident.  4. Return to clinic in 3 mos.    Edrick Kins, DPM [facility]  Dr. Edrick Kins, DPM    [address], [place] [address]                Office [phone]  Fax [phone]

## 2022-08-13 NOTE — Progress Notes (Signed)
  Care Coordination Note  08/13/2022 Name: Alex [name] MRN: [id number] DOB: 14-May-1951  [name] is a 71 y.o. year old male who is a primary care patient of Velna Hatchet, MD and is actively engaged with the care management team. I reached out to Ivor Reining by phone today to assist with re-scheduling an initial visit with the RN Case Manager  Follow up plan: Unsuccessful telephone outreach attempt made. A HIPAA compliant phone message was left for the patient providing contact information and requesting a return call.  We have been unable to make contact with the patient for follow up. The care management team is available to follow up with the patient after provider conversation with the patient regarding recommendation for care management engagement and subsequent re-referral to the care management team.   [place]  Direct Dial: [phone]

## 2022-10-02 IMAGING — MR MR SHOULDER*L* W/O CM
5 series · 40 of 40 positions shown · non-contrast
Comparison: None.

CLINICAL DATA: Left shoulder pain

EXAM:
MRI OF THE LEFT SHOULDER WITHOUT CONTRAST
TECHNIQUE: Multiplanar, multisequence MR imaging of the shoulder was performed.
No intravenous contrast was administered.

[Series 3: T2 fat-sat · axial · 4.0mm · 0.27mm/px · z∈[-79,+64]mm · 10 of 30 slices shown (1 of 3)]
[im 1/30]
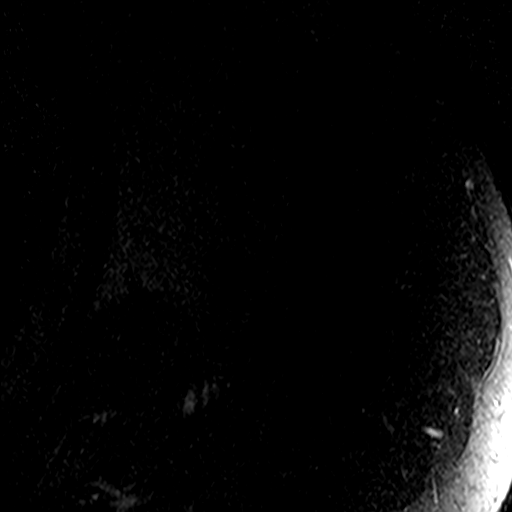
[im 4/30]
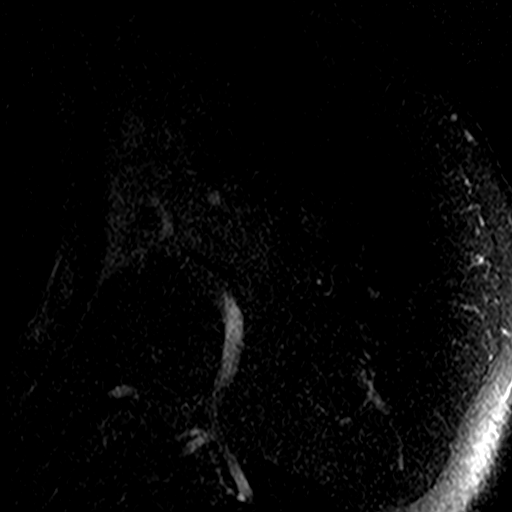
[im 7/30]
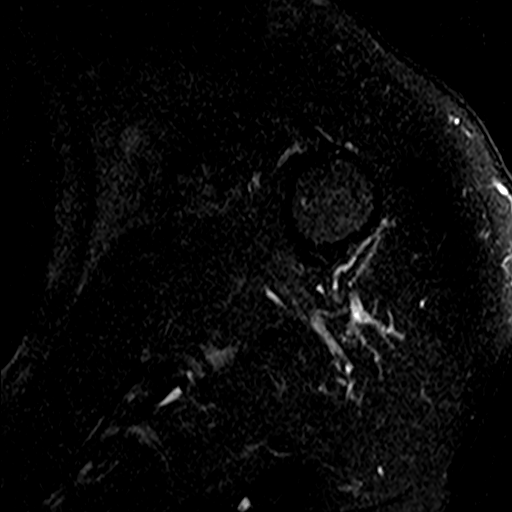
[im 10/30]
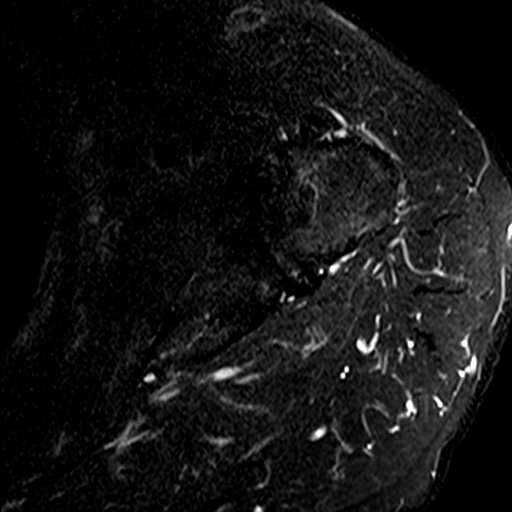
[im 13/30]
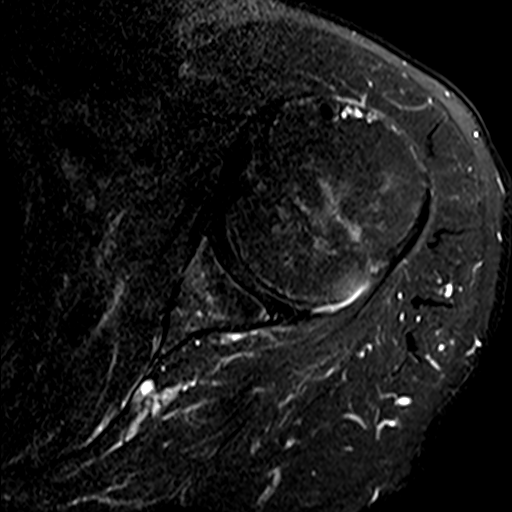
[im 17/30]
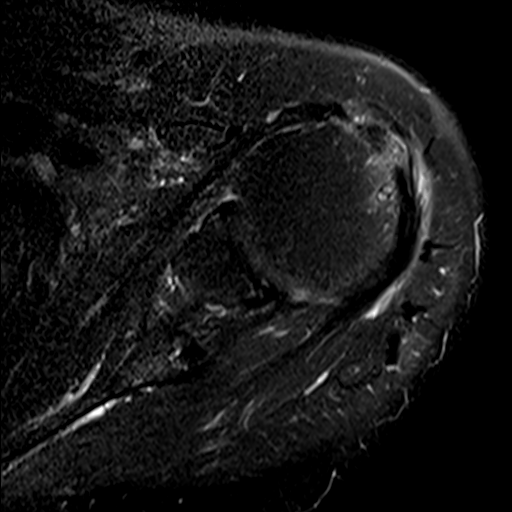
[im 20/30]
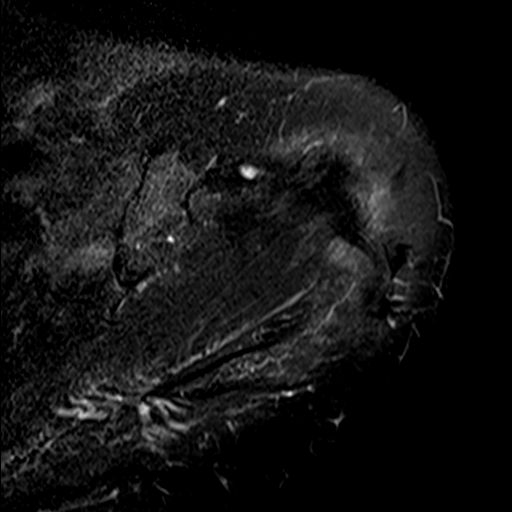
[im 23/30]
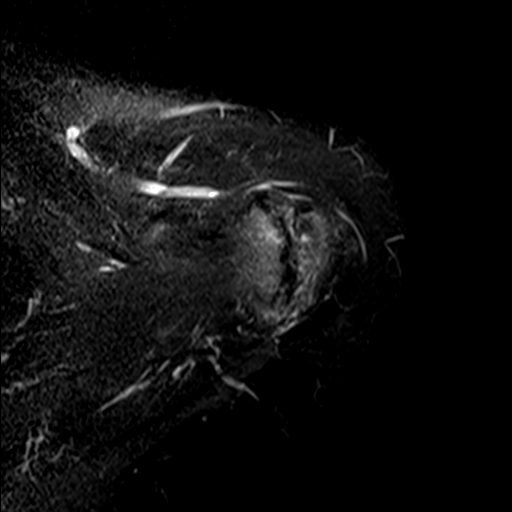
[im 26/30]
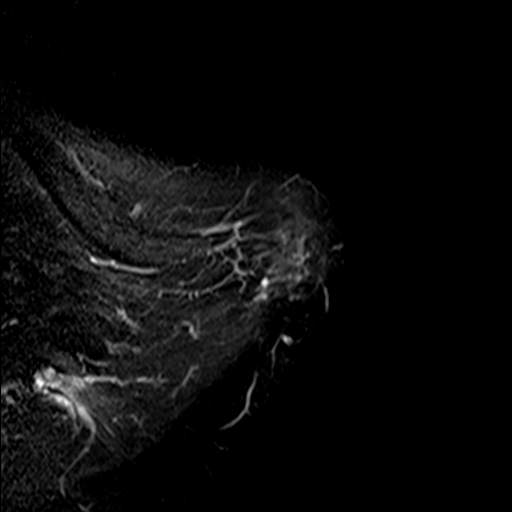
[im 30/30]
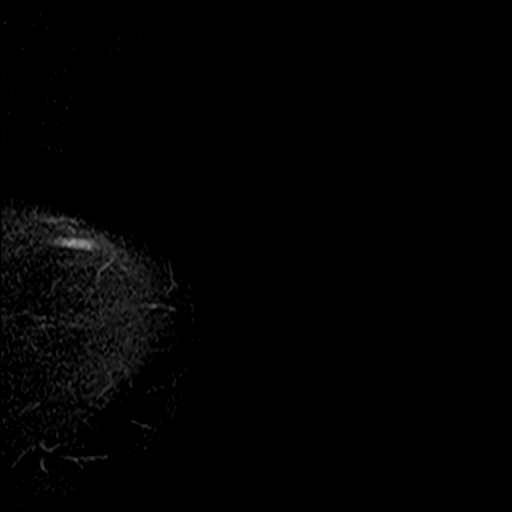

[Series 4: T2 fat-sat · oblique · 4.0mm · 0.59mm/px · 7 of 18 slices shown (2 of 3)]
[im 1/18]
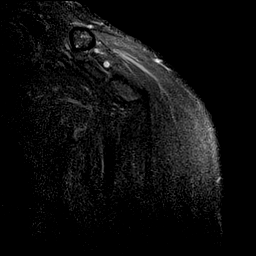
[im 3/18]
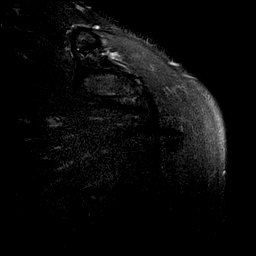
[im 6/18]
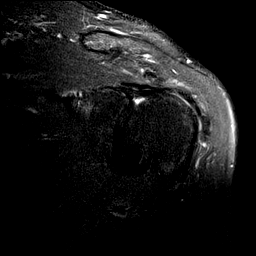
[im 9/18]
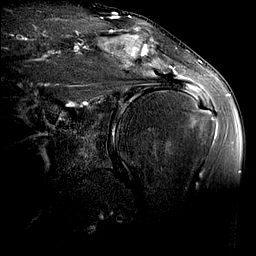
[im 12/18]
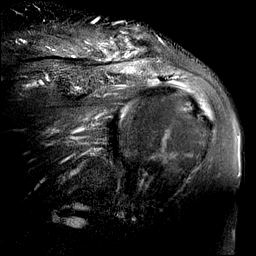
[im 15/18]
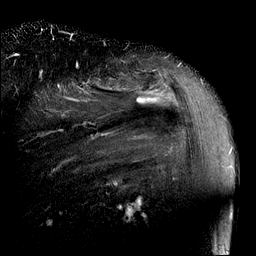
[im 18/18]
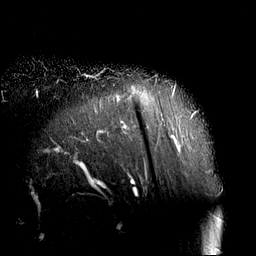

[Series 5: PD · oblique · 4.0mm · 0.59mm/px · 7 of 18 slices shown]
[im 1/18]
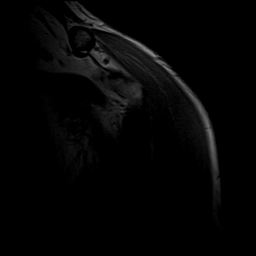
[im 3/18]
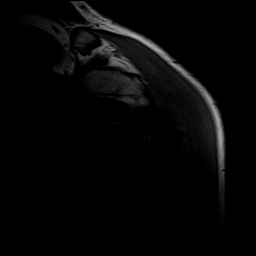
[im 6/18]
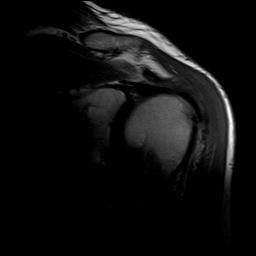
[im 9/18]
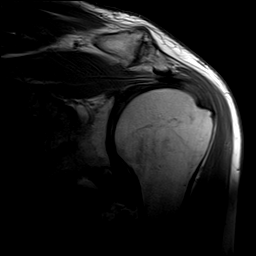
[im 12/18]
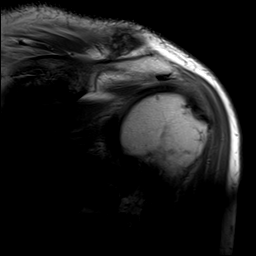
[im 15/18]
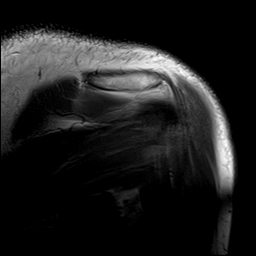
[im 18/18]
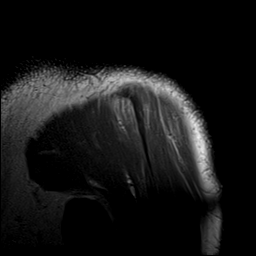

[Series 6: T2 fat-sat · oblique · 4.0mm · 0.59mm/px · 8 of 20 slices shown (3 of 3)]
[im 1/20]
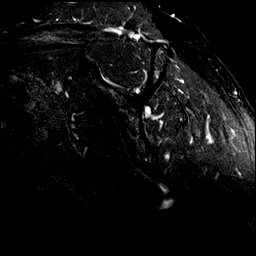
[im 3/20]
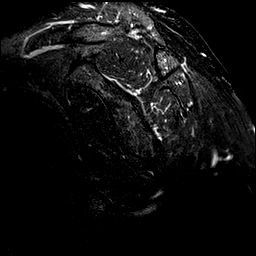
[im 6/20]
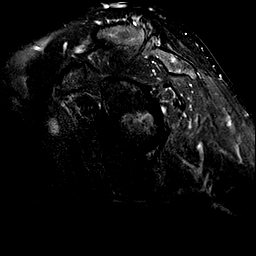
[im 9/20]
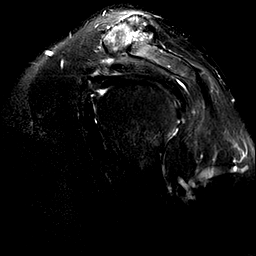
[im 11/20]
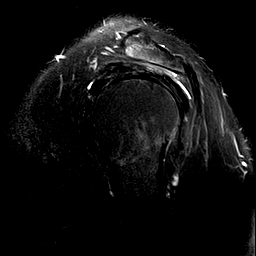
[im 14/20]
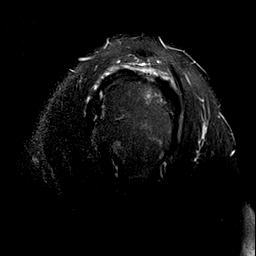
[im 17/20]
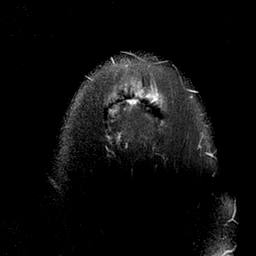
[im 20/20]
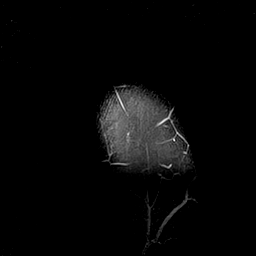

[Series 7: T1 · oblique · 4.0mm · 0.59mm/px · 8 of 20 slices shown]
[im 1/20]
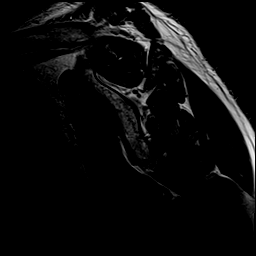
[im 3/20]
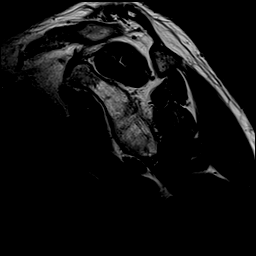
[im 6/20]
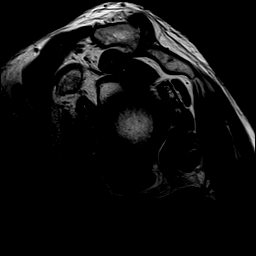
[im 9/20]
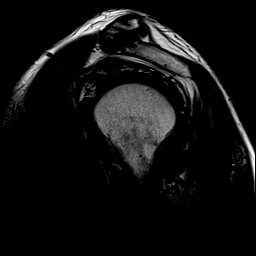
[im 11/20]
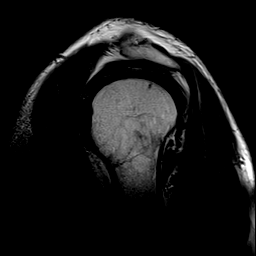
[im 14/20]
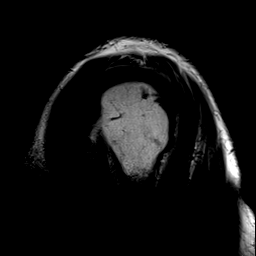
[im 17/20]
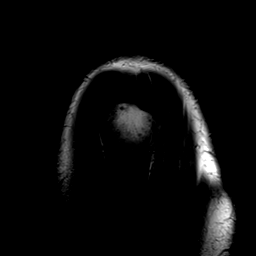
[im 20/20]
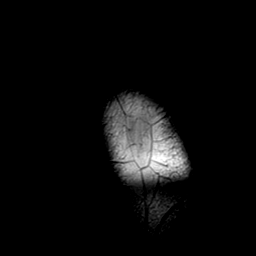

[40 of 40 positions shown; findings below may reference images not displayed]

FINDINGS: Rotator cuff: Severe tendinosis of the supraspinatus tendon. Mild
tendinosis of the infraspinatus tendon with fraying along the bursal
surface. Teres minor tendon is intact. Subscapularis tendon is
intact.

Muscles: No muscle atrophy or edema. No intramuscular fluid
collection or hematoma.

Biceps Long Head: Intraarticular and extraarticular portions of the
biceps tendon are intact.

Acromioclavicular Joint: Severe arthropathy of the acromioclavicular
joint. Type I acromion. Trace subacromial/subdeltoid bursal fluid.

Glenohumeral Joint: No joint effusion. Partial-thickness cartilage
loss of the glenohumeral joint.

Labrum: Grossly intact, but evaluation is limited by lack of
intraarticular fluid/contrast.

Bones: No fracture or dislocation. No aggressive osseous lesion.
Subcortical reactive marrow changes at the supraspinatus insertion.

Other: No fluid collection or hematoma.
IMPRESSION: 1. Severe tendinosis of the supraspinatus tendon.
2. Mild tendinosis of the infraspinatus tendon with fraying along
the bursal surface.

## 2022-11-16 ENCOUNTER — Telehealth: Payer: Self-pay | Admitting: Podiatry

## 2022-11-16 NOTE — Telephone Encounter (Signed)
Pts daughter misty called and left message at 1200pm to cxl appt for pt and they will call to r/s. They are having issues with his insurance.  I clxed appt and tried to call to confirm with pt but the vm was in spanish.

## 2022-11-18 ENCOUNTER — Ambulatory Visit: Payer: BC Managed Care – PPO | Admitting: Podiatry

## 2023-10-12 DIAGNOSIS — E785 Hyperlipidemia, unspecified: Secondary | ICD-10-CM | POA: Diagnosis not present

## 2023-10-12 DIAGNOSIS — I10 Essential (primary) hypertension: Secondary | ICD-10-CM | POA: Diagnosis not present

## 2023-10-12 DIAGNOSIS — Z125 Encounter for screening for malignant neoplasm of prostate: Secondary | ICD-10-CM | POA: Diagnosis not present

## 2023-10-21 DIAGNOSIS — I700 Atherosclerosis of aorta: Secondary | ICD-10-CM | POA: Diagnosis not present

## 2023-10-21 DIAGNOSIS — Z0000 Encounter for general adult medical examination without abnormal findings: Secondary | ICD-10-CM | POA: Diagnosis not present

## 2023-10-21 DIAGNOSIS — E785 Hyperlipidemia, unspecified: Secondary | ICD-10-CM | POA: Diagnosis not present

## 2023-10-21 DIAGNOSIS — M19019 Primary osteoarthritis, unspecified shoulder: Secondary | ICD-10-CM | POA: Diagnosis not present

## 2023-10-21 DIAGNOSIS — D692 Other nonthrombocytopenic purpura: Secondary | ICD-10-CM | POA: Diagnosis not present

## 2023-10-21 DIAGNOSIS — R82998 Other abnormal findings in urine: Secondary | ICD-10-CM | POA: Diagnosis not present

## 2023-10-21 DIAGNOSIS — I10 Essential (primary) hypertension: Secondary | ICD-10-CM | POA: Diagnosis not present

## 2023-10-21 DIAGNOSIS — I82409 Acute embolism and thrombosis of unspecified deep veins of unspecified lower extremity: Secondary | ICD-10-CM | POA: Diagnosis not present

## 2023-10-21 DIAGNOSIS — G4700 Insomnia, unspecified: Secondary | ICD-10-CM | POA: Diagnosis not present

## 2023-10-21 DIAGNOSIS — R296 Repeated falls: Secondary | ICD-10-CM | POA: Diagnosis not present

## 2023-10-21 DIAGNOSIS — R2689 Other abnormalities of gait and mobility: Secondary | ICD-10-CM | POA: Diagnosis not present

## 2023-10-21 DIAGNOSIS — Z8781 Personal history of (healed) traumatic fracture: Secondary | ICD-10-CM | POA: Diagnosis not present

## 2023-10-21 DIAGNOSIS — D6869 Other thrombophilia: Secondary | ICD-10-CM | POA: Diagnosis not present

## 2023-10-25 DIAGNOSIS — H04123 Dry eye syndrome of bilateral lacrimal glands: Secondary | ICD-10-CM | POA: Diagnosis not present

## 2023-10-25 DIAGNOSIS — H35033 Hypertensive retinopathy, bilateral: Secondary | ICD-10-CM | POA: Diagnosis not present

## 2023-10-25 DIAGNOSIS — H2513 Age-related nuclear cataract, bilateral: Secondary | ICD-10-CM | POA: Diagnosis not present

## 2023-11-21 DIAGNOSIS — H2513 Age-related nuclear cataract, bilateral: Secondary | ICD-10-CM | POA: Diagnosis not present

## 2023-11-21 DIAGNOSIS — H25043 Posterior subcapsular polar age-related cataract, bilateral: Secondary | ICD-10-CM | POA: Diagnosis not present

## 2023-11-21 DIAGNOSIS — H18413 Arcus senilis, bilateral: Secondary | ICD-10-CM | POA: Diagnosis not present

## 2023-11-21 DIAGNOSIS — H2511 Age-related nuclear cataract, right eye: Secondary | ICD-10-CM | POA: Diagnosis not present

## 2023-11-21 DIAGNOSIS — H25013 Cortical age-related cataract, bilateral: Secondary | ICD-10-CM | POA: Diagnosis not present

## 2024-02-02 DIAGNOSIS — H2511 Age-related nuclear cataract, right eye: Secondary | ICD-10-CM | POA: Diagnosis not present

## 2024-02-03 DIAGNOSIS — H2512 Age-related nuclear cataract, left eye: Secondary | ICD-10-CM | POA: Diagnosis not present

## 2024-02-09 DIAGNOSIS — H2511 Age-related nuclear cataract, right eye: Secondary | ICD-10-CM | POA: Diagnosis not present

## 2024-02-23 DIAGNOSIS — H25042 Posterior subcapsular polar age-related cataract, left eye: Secondary | ICD-10-CM | POA: Diagnosis not present

## 2024-02-23 DIAGNOSIS — H25012 Cortical age-related cataract, left eye: Secondary | ICD-10-CM | POA: Diagnosis not present

## 2024-02-23 DIAGNOSIS — H2512 Age-related nuclear cataract, left eye: Secondary | ICD-10-CM | POA: Diagnosis not present

## 2024-02-29 DIAGNOSIS — H2512 Age-related nuclear cataract, left eye: Secondary | ICD-10-CM | POA: Diagnosis not present
